# Patient Record
Sex: Female | Born: 1935 | Race: White | Hispanic: No | Smoking: Never smoker
Health system: Southern US, Community
[De-identification: ages and names within clinical notes are randomized; demographics above are authoritative.]

## PROBLEM LIST (undated history)

## (undated) DIAGNOSIS — M1990 Unspecified osteoarthritis, unspecified site: Secondary | ICD-10-CM

## (undated) DIAGNOSIS — I219 Acute myocardial infarction, unspecified: Secondary | ICD-10-CM

## (undated) DIAGNOSIS — N289 Disorder of kidney and ureter, unspecified: Secondary | ICD-10-CM

## (undated) DIAGNOSIS — E785 Hyperlipidemia, unspecified: Secondary | ICD-10-CM

## (undated) DIAGNOSIS — H3530 Unspecified macular degeneration: Secondary | ICD-10-CM

## (undated) DIAGNOSIS — I495 Sick sinus syndrome: Secondary | ICD-10-CM

## (undated) DIAGNOSIS — J45909 Unspecified asthma, uncomplicated: Secondary | ICD-10-CM

## (undated) DIAGNOSIS — I10 Essential (primary) hypertension: Secondary | ICD-10-CM

## (undated) DIAGNOSIS — M7120 Synovial cyst of popliteal space [Baker], unspecified knee: Secondary | ICD-10-CM

## (undated) DIAGNOSIS — K219 Gastro-esophageal reflux disease without esophagitis: Secondary | ICD-10-CM

## (undated) DIAGNOSIS — I509 Heart failure, unspecified: Secondary | ICD-10-CM

## (undated) DIAGNOSIS — Z227 Latent tuberculosis: Secondary | ICD-10-CM

## (undated) DIAGNOSIS — I5032 Chronic diastolic (congestive) heart failure: Secondary | ICD-10-CM

## (undated) DIAGNOSIS — I4891 Unspecified atrial fibrillation: Secondary | ICD-10-CM

## (undated) DIAGNOSIS — Z950 Presence of cardiac pacemaker: Secondary | ICD-10-CM

## (undated) DIAGNOSIS — N393 Stress incontinence (female) (male): Secondary | ICD-10-CM

## (undated) DIAGNOSIS — Z8619 Personal history of other infectious and parasitic diseases: Secondary | ICD-10-CM

## (undated) DIAGNOSIS — E119 Type 2 diabetes mellitus without complications: Secondary | ICD-10-CM

## (undated) DIAGNOSIS — Z9989 Dependence on other enabling machines and devices: Secondary | ICD-10-CM

## (undated) DIAGNOSIS — J449 Chronic obstructive pulmonary disease, unspecified: Secondary | ICD-10-CM

## (undated) DIAGNOSIS — G4733 Obstructive sleep apnea (adult) (pediatric): Secondary | ICD-10-CM

## (undated) DIAGNOSIS — C801 Malignant (primary) neoplasm, unspecified: Secondary | ICD-10-CM

## (undated) DIAGNOSIS — R011 Cardiac murmur, unspecified: Secondary | ICD-10-CM

## (undated) HISTORY — PX: BLADDER SUSPENSION: SHX72

## (undated) HISTORY — PX: CHOLECYSTECTOMY: SHX55

## (undated) HISTORY — PX: PACEMAKER INSERTION: SHX728

## (undated) HISTORY — PX: TUBAL LIGATION: SHX77

---

## 2004-04-27 DIAGNOSIS — I219 Acute myocardial infarction, unspecified: Secondary | ICD-10-CM

## 2004-04-27 HISTORY — DX: Acute myocardial infarction, unspecified: I21.9

## 2005-04-27 DIAGNOSIS — Z950 Presence of cardiac pacemaker: Secondary | ICD-10-CM

## 2005-04-27 DIAGNOSIS — I495 Sick sinus syndrome: Secondary | ICD-10-CM

## 2005-04-27 HISTORY — DX: Presence of cardiac pacemaker: Z95.0

## 2005-04-27 HISTORY — DX: Sick sinus syndrome: I49.5

## 2012-10-13 DIAGNOSIS — Z9989 Dependence on other enabling machines and devices: Secondary | ICD-10-CM

## 2012-10-13 DIAGNOSIS — I4891 Unspecified atrial fibrillation: Secondary | ICD-10-CM | POA: Insufficient documentation

## 2012-10-13 DIAGNOSIS — I10 Essential (primary) hypertension: Secondary | ICD-10-CM | POA: Insufficient documentation

## 2012-10-13 DIAGNOSIS — I2510 Atherosclerotic heart disease of native coronary artery without angina pectoris: Secondary | ICD-10-CM | POA: Insufficient documentation

## 2012-10-13 DIAGNOSIS — I495 Sick sinus syndrome: Secondary | ICD-10-CM | POA: Insufficient documentation

## 2012-10-13 DIAGNOSIS — G4733 Obstructive sleep apnea (adult) (pediatric): Secondary | ICD-10-CM | POA: Insufficient documentation

## 2014-03-01 DIAGNOSIS — R0600 Dyspnea, unspecified: Secondary | ICD-10-CM | POA: Insufficient documentation

## 2014-03-01 DIAGNOSIS — M1990 Unspecified osteoarthritis, unspecified site: Secondary | ICD-10-CM | POA: Insufficient documentation

## 2014-03-01 DIAGNOSIS — E119 Type 2 diabetes mellitus without complications: Secondary | ICD-10-CM | POA: Insufficient documentation

## 2014-03-08 DIAGNOSIS — Z227 Latent tuberculosis: Secondary | ICD-10-CM | POA: Insufficient documentation

## 2014-06-27 ENCOUNTER — Emergency Department (HOSPITAL_COMMUNITY): Payer: Medicare Other

## 2014-06-27 ENCOUNTER — Emergency Department (HOSPITAL_COMMUNITY)
Admission: EM | Admit: 2014-06-27 | Discharge: 2014-06-27 | Disposition: A | Discharge status: Home / Self Care | Payer: Medicare Other | Attending: Emergency Medicine | Admitting: Emergency Medicine

## 2014-06-27 ENCOUNTER — Encounter (HOSPITAL_COMMUNITY): Payer: Self-pay | Admitting: *Deleted

## 2014-06-27 DIAGNOSIS — R0602 Shortness of breath: Secondary | ICD-10-CM | POA: Diagnosis present

## 2014-06-27 DIAGNOSIS — Z79899 Other long term (current) drug therapy: Secondary | ICD-10-CM | POA: Insufficient documentation

## 2014-06-27 DIAGNOSIS — Z7901 Long term (current) use of anticoagulants: Secondary | ICD-10-CM | POA: Diagnosis not present

## 2014-06-27 DIAGNOSIS — I4891 Unspecified atrial fibrillation: Secondary | ICD-10-CM | POA: Insufficient documentation

## 2014-06-27 DIAGNOSIS — R05 Cough: Secondary | ICD-10-CM | POA: Insufficient documentation

## 2014-06-27 DIAGNOSIS — E119 Type 2 diabetes mellitus without complications: Secondary | ICD-10-CM | POA: Insufficient documentation

## 2014-06-27 DIAGNOSIS — E785 Hyperlipidemia, unspecified: Secondary | ICD-10-CM | POA: Insufficient documentation

## 2014-06-27 DIAGNOSIS — I10 Essential (primary) hypertension: Secondary | ICD-10-CM | POA: Insufficient documentation

## 2014-06-27 DIAGNOSIS — R0600 Dyspnea, unspecified: Secondary | ICD-10-CM | POA: Insufficient documentation

## 2014-06-27 DIAGNOSIS — Z794 Long term (current) use of insulin: Secondary | ICD-10-CM | POA: Diagnosis not present

## 2014-06-27 DIAGNOSIS — Z950 Presence of cardiac pacemaker: Secondary | ICD-10-CM | POA: Diagnosis not present

## 2014-06-27 HISTORY — DX: Unspecified atrial fibrillation: I48.91

## 2014-06-27 HISTORY — DX: Hyperlipidemia, unspecified: E78.5

## 2014-06-27 LAB — CBC
HCT: 38.4 % (ref 36.0–46.0)
Hemoglobin: 12.6 g/dL (ref 12.0–15.0)
MCH: 27.8 pg (ref 26.0–34.0)
MCHC: 32.8 g/dL (ref 30.0–36.0)
MCV: 84.8 fL (ref 78.0–100.0)
Platelets: 210 10*3/uL (ref 150–400)
RBC: 4.53 MIL/uL (ref 3.87–5.11)
RDW: 14.3 % (ref 11.5–15.5)
WBC: 7.2 10*3/uL (ref 4.0–10.5)

## 2014-06-27 LAB — BASIC METABOLIC PANEL
Anion gap: 8 (ref 5–15)
BUN: 24 mg/dL — AB (ref 6–23)
CO2: 27 mmol/L (ref 19–32)
CREATININE: 1.28 mg/dL — AB (ref 0.50–1.10)
Calcium: 9.5 mg/dL (ref 8.4–10.5)
Chloride: 105 mmol/L (ref 96–112)
GFR calc Af Amer: 43 mL/min — ABNORMAL LOW (ref >90)
GFR calc non Af Amer: 37 mL/min — ABNORMAL LOW (ref >90)
GLUCOSE: 163 mg/dL — AB (ref 70–99)
Potassium: 4.3 mmol/L (ref 3.5–5.1)
Sodium: 140 mmol/L (ref 135–145)

## 2014-06-27 LAB — I-STAT TROPONIN, ED: TROPONIN I, POC: 0.01 ng/mL (ref 0.00–0.08)

## 2014-06-27 LAB — D-DIMER, QUANTITATIVE (NOT AT ARMC)

## 2014-06-27 LAB — PROTIME-INR
INR: 1.67 — ABNORMAL HIGH (ref 0.00–1.49)
Prothrombin Time: 19.8 seconds — ABNORMAL HIGH (ref 11.6–15.2)

## 2014-06-27 NOTE — Discharge Instructions (Signed)

## 2014-06-27 NOTE — ED Notes (Signed)
Patient had carioversion on Jan 23rd and also had pacemaker replaced.  Patient with onset of sob 2 days ago. Her MD sent here for further evaluation and concerned that patient may be in afib.  Patient is alert.  She is seen by Dr Willis Modena with [facility]

## 2014-06-27 NOTE — ED Provider Notes (Signed)
CSN: [id number]     Arrival date & time 06/27/14  1015 History   First MD Initiated Contact with Patient 06/27/14 1028     Chief Complaint  Patient presents with  . Atrial Fibrillation  . Shortness of Breath   HPI Pt had a cardioversion on Jan 23 because of a fib.  She also had her pacemaker battery replaced.  The last couple of days she noticed that she was getting short of breath.  She does have a cough but that is not necessarily new.  No leg swelling.  No fevers.  No chest pain.  The shortness of breath is moderate.  It gets better with rest.  Worse with exertion.  In the past she felt this way with her a fib.  She called her doctor today and was told to come to the ED. Past Medical History  Diagnosis Date  . Atrial fibrillation   . Bradycardia   . Diabetes mellitus without complication   . Hypertension   . Hyperlipemia    Past Surgical History  Procedure Laterality Date  . Pacemaker insertion    . Cholecystectomy    . Bladder suspension     No family history on file. History  Substance Use Topics  . Smoking status: Never Smoker   . Smokeless tobacco: Not on file  . Alcohol Use: No   OB History    No data available     Review of Systems  All other systems reviewed and are negative.     Allergies  Sulfa antibiotics and Vasotec  Home Medications   Prior to Admission medications   Medication Sig Start Date End Date Taking? Authorizing Provider  albuterol (PROVENTIL HFA;VENTOLIN HFA) 108 (90 BASE) MCG/ACT inhaler Inhale 1-2 puffs into the lungs every 6 (six) hours as needed for wheezing or shortness of breath.   Yes Historical Provider, MD  amLODipine (NORVASC) 10 MG tablet Take 10 mg by mouth daily.   Yes Historical Provider, MD  atorvastatin (LIPITOR) 40 MG tablet Take 40 mg by mouth daily.   Yes Historical Provider, MD  carvedilol (COREG) 12.5 MG tablet Take 12.5 mg by mouth 2 (two) times daily with a meal.   Yes Historical Provider, MD  cetirizine (ZYRTEC) 10  MG tablet Take 10 mg by mouth daily.   Yes Historical Provider, MD  cholecalciferol (VITAMIN D) 1000 UNITS tablet Take 1,000 Units by mouth 2 (two) times daily.   Yes Historical Provider, MD  insulin detemir (LEVEMIR) 100 UNIT/ML injection Inject 43 Units into the skin at bedtime.   Yes Historical Provider, MD  losartan (COZAAR) 100 MG tablet Take 100 mg by mouth daily.   Yes Historical Provider, MD  Multiple Vitamins-Minerals (PRESERVISION AREDS) CAPS Take 1 capsule by mouth 2 (two) times daily.   Yes Historical Provider, MD  nitroGLYCERIN (NITROSTAT) 0.4 MG SL tablet Place 0.4 mg under the tongue every 5 (five) minutes as needed for chest pain.   Yes Historical Provider, MD  Omega-3 Fatty Acids (FISH OIL) 1000 MG CAPS Take 1,000 mg by mouth 2 (two) times daily.   Yes Historical Provider, MD  omeprazole (PRILOSEC) 20 MG capsule Take 20 mg by mouth daily.   Yes Historical Provider, MD  traMADol (ULTRAM) 50 MG tablet Take 50 mg by mouth every 6 (six) hours as needed for moderate pain.   Yes Historical Provider, MD  warfarin (COUMADIN) 3 MG tablet Take 3 mg by mouth See admin instructions. 3mg  all days but Sunday  Yes Historical Provider, MD   BP 144/94 mmHg  Pulse 60  Temp(Src) 97.9 F (36.6 C) (Oral)  Resp 14  Ht 5\' 2"  (1.575 m)  Wt 191 lb (86.637 kg)  BMI 34.93 kg/m2  SpO2 98% Physical Exam  Constitutional: She appears well-developed and well-nourished. No distress.  HENT:  Head: Normocephalic and atraumatic.  Right Ear: External ear normal.  Left Ear: External ear normal.  Eyes: Conjunctivae are normal. Right eye exhibits no discharge. Left eye exhibits no discharge. No scleral icterus.  Neck: Neck supple. No tracheal deviation present.  Cardiovascular: Normal rate, regular rhythm and intact distal pulses.   Pulmonary/Chest: Effort normal and breath sounds normal. No stridor. No respiratory distress. She has no wheezes. She has no rales.  Abdominal: Soft. Bowel sounds are normal. She  exhibits no distension. There is no tenderness. There is no rebound and no guarding.  Musculoskeletal: She exhibits no edema or tenderness.  Neurological: She is alert. She has normal strength. No cranial nerve deficit (no facial droop, extraocular movements intact, no slurred speech) or sensory deficit. She exhibits normal muscle tone. She displays no seizure activity. Coordination normal.  Skin: Skin is warm and dry. No rash noted.  Psychiatric: She has a normal mood and affect.  Nursing note and vitals reviewed.   ED Course  Procedures (including critical care time) Labs Review Labs Reviewed  BASIC METABOLIC PANEL - Abnormal; Notable for the following:    Glucose, Bld 163 (*)    BUN 24 (*)    Creatinine, Ser 1.28 (*)    GFR calc non Af Amer 37 (*)    GFR calc Af Amer 43 (*)    All other components within normal limits  PROTIME-INR - Abnormal; Notable for the following:    Prothrombin Time 19.8 (*)    INR 1.67 (*)    All other components within normal limits  CBC  D-DIMER, QUANTITATIVE  I-STAT TROPOININ, ED    Imaging Review Dg Chest 2 View  06/27/2014   CLINICAL DATA:  Shortness of breath, atrial fibrillation  EXAM: CHEST  2 VIEW  COMPARISON:  None.  FINDINGS: Cardiomediastinal silhouette is unremarkable. No acute infiltrate or pleural effusion. No pulmonary edema. Dual lead cardiac pacemaker with leads in right atrium and right ventricle. Atherosclerotic calcifications of thoracic aorta. Bony thorax is unremarkable.  IMPRESSION: No active disease.  Dual lead cardiac pacemaker in place.   Electronically Signed   By: Lahoma Crocker M.D.   On: 06/27/2014 11:19     EKG Interpretation   Date/Time:  Wednesday June 27 2014 11:36:34 EST Ventricular Rate:  60 PR Interval:  224 QRS Duration: 100 QT Interval:  450 QTC Calculation: 450 R Axis:   4 Text Interpretation:  Atrial-paced rhythm Probable anteroseptal infarct,  old No significant change since last tracing Confirmed by Kerryann Allaire   MD-J, Aitan Rossbach  ([id number]) on 06/27/2014 11:40:07 AM      MDM   Final diagnoses:  Dyspnea    The patient has a normal evaluation here in the emergency department. She is not hypoxic she is not tachypnea. Her laboratory tests are reassuring. I doubt pulmonary embolism congestive heart failure, pneumonia or acute coronary syndrome.  The patient is not in atrial fibrillation. Her EKG shows that her pacemaker is working properly.  Etiology of her dyspnea is unclear  I think the patient is stable to follow up with her primary doctor and/or cardiologist.  Warning signs and precautions were discussed    Dorie Rank, MD  06/27/14 1255 

## 2014-07-06 ENCOUNTER — Ambulatory Visit: Payer: Self-pay | Admitting: Gynecology

## 2014-07-11 ENCOUNTER — Ambulatory Visit: Payer: Medicare Other

## 2014-07-11 ENCOUNTER — Ambulatory Visit: Payer: Medicare Other | Attending: Gynecology | Admitting: Gynecologic Oncology

## 2014-07-11 ENCOUNTER — Encounter: Payer: Self-pay | Admitting: *Deleted

## 2014-07-11 ENCOUNTER — Encounter: Payer: Self-pay | Admitting: Gynecologic Oncology

## 2014-07-11 VITALS — BP 170/59 | HR 60 | Resp 22 | Ht 62.0 in | Wt 194.7 lb

## 2014-07-11 DIAGNOSIS — B373 Candidiasis of vulva and vagina: Secondary | ICD-10-CM | POA: Insufficient documentation

## 2014-07-11 DIAGNOSIS — Z79899 Other long term (current) drug therapy: Secondary | ICD-10-CM | POA: Diagnosis not present

## 2014-07-11 DIAGNOSIS — C541 Malignant neoplasm of endometrium: Secondary | ICD-10-CM | POA: Insufficient documentation

## 2014-07-11 DIAGNOSIS — E785 Hyperlipidemia, unspecified: Secondary | ICD-10-CM | POA: Diagnosis not present

## 2014-07-11 DIAGNOSIS — I4891 Unspecified atrial fibrillation: Secondary | ICD-10-CM | POA: Insufficient documentation

## 2014-07-11 DIAGNOSIS — Z950 Presence of cardiac pacemaker: Secondary | ICD-10-CM | POA: Diagnosis not present

## 2014-07-11 DIAGNOSIS — N950 Postmenopausal bleeding: Secondary | ICD-10-CM | POA: Insufficient documentation

## 2014-07-11 DIAGNOSIS — B372 Candidiasis of skin and nail: Secondary | ICD-10-CM | POA: Insufficient documentation

## 2014-07-11 DIAGNOSIS — E119 Type 2 diabetes mellitus without complications: Secondary | ICD-10-CM | POA: Insufficient documentation

## 2014-07-11 DIAGNOSIS — Z9049 Acquired absence of other specified parts of digestive tract: Secondary | ICD-10-CM | POA: Diagnosis not present

## 2014-07-11 DIAGNOSIS — Z794 Long term (current) use of insulin: Secondary | ICD-10-CM | POA: Diagnosis not present

## 2014-07-11 DIAGNOSIS — I10 Essential (primary) hypertension: Secondary | ICD-10-CM | POA: Diagnosis not present

## 2014-07-11 DIAGNOSIS — Z7901 Long term (current) use of anticoagulants: Secondary | ICD-10-CM | POA: Diagnosis not present

## 2014-07-11 DIAGNOSIS — B3731 Acute candidiasis of vulva and vagina: Secondary | ICD-10-CM

## 2014-07-11 LAB — BASIC METABOLIC PANEL (CC13)
ANION GAP: 11 meq/L (ref 3–11)
BUN: 30.9 mg/dL — ABNORMAL HIGH (ref 7.0–26.0)
CALCIUM: 9.3 mg/dL (ref 8.4–10.4)
CO2: 26 mEq/L (ref 22–29)
Chloride: 107 mEq/L (ref 98–109)
Creatinine: 1.3 mg/dL — ABNORMAL HIGH (ref 0.6–1.1)
EGFR: 37 mL/min/{1.73_m2} — AB (ref >90)
Glucose: 92 mg/dl (ref 70–140)
POTASSIUM: 4.8 meq/L (ref 3.5–5.1)
Sodium: 144 mEq/L (ref 136–145)

## 2014-07-11 MED ORDER — NYSTATIN 100000 UNIT/GM EX POWD: 1.0000 | Freq: Two times a day (BID) | CUTANEOUS | Status: DC

## 2014-07-11 MED ORDER — FLUCONAZOLE 100 MG PO TABS: 150.0000 mg | ORAL_TABLET | Freq: Every day | ORAL | Status: DC

## 2014-07-11 NOTE — Patient Instructions (Addendum)
Preparing for your Surgery  Plan for surgery on April 21 with Dr. Denman George.  Pre-operative Testing -You will receive a phone call from presurgical testing at [facility] to arrange for a pre-operative testing appointment before your surgery.  This appointment normally occurs one to two weeks before your scheduled surgery.   -Bring your insurance card, copy of an advanced directive if applicable, medication list  -At that visit, you will be asked to sign a consent for a possible blood transfusion in case a transfusion becomes necessary during surgery.  The need for a blood transfusion is rare but having consent is a necessary part of your care.     -Stop taking coumadin 7 days prior to surgery. - Call Dr. Archie Balboa office at [phone] to schedule an appointment for cardiac clearance prior to surgery.  Day Before Surgery at [place] will be asked to take in only clear liquids the day before surgery.  Examples of clear liquids include broths, jello, and clear juices. You will be advised to have nothing to eat or drink after midnight the evening before.    Your role in recovery Your role is to become active as soon as directed by your doctor, while still giving yourself time to heal.  Rest when you feel tired. You will be asked to do the following in order to speed your recovery:  - Cough and breathe deeply. This helps toclear and expand your lungs and can prevent pneumonia. You may be given a spirometer to practice deep breathing. A staff member will show you how to use the spirometer. - Do mild physical activity. Walking or moving your legs help your circulation and body functions return to normal. A staff member will help you when you try to walk and will provide you with simple exercises. Do not try to get up or walk alone the first time. - Actively manage your pain. Managing your pain lets you move in comfort. We will ask you to rate your pain on a scale of zero to  10. It is your responsibility to tell your doctor or nurse where and how much you hurt so your pain can be treated.  Special Considerations -If you are diabetic, you may be placed on insulin after surgery to have closer control over your blood sugars to promote healing and recovery.  This does not mean that you will be discharged on insulin.  If applicable, your oral antidiabetics will be resumed when you are tolerating a solid diet.  -Your final pathology results from surgery should be available by the Friday after surgery and the results will be relayed to you when available.  Blood Transfusion Information WHAT IS A BLOOD TRANSFUSION? A transfusion is the replacement of blood or some of its parts. Blood is made up of multiple cells which provide different functions.  Red blood cells carry oxygen and are used for blood loss replacement.  White blood cells fight against infection.  Platelets control bleeding.  Plasma helps clot blood.  Other blood products are available for specialized needs, such as hemophilia or other clotting disorders. BEFORE THE TRANSFUSION  Who gives blood for transfusions?   You may be able to donate blood to be used at a later date on yourself (autologous donation).  Relatives can be asked to donate blood. This is generally not any safer than if you have received blood from a stranger. The same precautions are taken to ensure safety when a relative's blood is donated.  Healthy  volunteers who are fully evaluated to make sure their blood is safe. This is blood bank blood. Transfusion therapy is the safest it has ever been in the practice of medicine. Before blood is taken from a donor, a complete history is taken to make sure that person has no history of diseases nor engages in risky social behavior (examples are intravenous drug use or sexual activity with multiple partners). The donor's travel history is screened to minimize risk of transmitting infections, such  as malaria. The donated blood is tested for signs of infectious diseases, such as HIV and hepatitis. The blood is then tested to be sure it is compatible with you in order to minimize the chance of a transfusion reaction. If you or a relative donates blood, this is often done in anticipation of surgery and is not appropriate for emergency situations. It takes many days to process the donated blood. RISKS AND COMPLICATIONS Although transfusion therapy is very safe and saves many lives, the main dangers of transfusion include:   Getting an infectious disease.  Developing a transfusion reaction. This is an allergic reaction to something in the blood you were given. Every precaution is taken to prevent this. The decision to have a blood transfusion has been considered carefully by your caregiver before blood is given. Blood is not given unless the benefits outweigh the risks.

## 2014-07-11 NOTE — Progress Notes (Signed)
Consult Note: Gyn-Onc  Consult was requested by Dr. Radene Knee for the evaluation of Summer Wong 81 y.o. female with endometrial cancer  CC:  Chief Complaint  Patient presents with  . endometrial cancer    Assessment/Plan:  Summer Wong  is a 81 y.o.  year old with clinical stage II high grade (grade 3) endometrial cancer.   A detailed discussion was held with the patient and her family with regard to to her endometrial cancer diagnosis. We discussed the standard management options for uterine cancer which includes surgery followed possibly by adjuvant therapy depending on the results of surgery. The options for surgical management include a hysterectomy and removal of the tubes and ovaries possibly with removal of pelvic and para-aortic lymph nodes. Despite visible endocervical involvement of the tumor, the cervical stroma and parametria are palpably clear, and I do not believe a radical hysterectomy is needed to achieve negative margins. A minimally invasive approach including a robotic hysterectomy or laparoscopic hysterectomy have benefits including shorter hospital stay, recovery time and better wound healing. The alternative approach is an open hysterectomy. The patient has been counseled about these surgical options and the risks of surgery in general including infection, bleeding, damage to surrounding structures (including bowel, bladder, ureters, nerves or vessels), and the postoperative risks of PE/ DVT, and lymphedema. I extensively reviewed the additional risks of robotic hysterectomy including possible need for conversion to open laparotomy.  I discussed positioning during surgery of trendelenberg and risks of minor facial swelling and care we take in preoperative positioning.  After counseling and consideration of her options, she desires to proceed with robotic assisted hysterectomy, BSO, lymphadenectomy.  I discussed that postoperatively she will certainly require at least  radiation for her stage II disease. I discussed that the addition of chemotherapy will be pending her staging results and cell type evaluation.    We will assess a preoperative CT of chest, abdomen and pelvis to rule out distant metastatic disease that would contraindicate upfront surgery or a minimally invasive approach.  She will be seen by anesthesia for preoperative clearance and discussion of postoperative pain management. She takes coumadin for a fib. We recommend stopping this preoperatively (7 days preop) and will check a PT/INR within 48 hours of surgery to confirm a normalized coag profile. She does not require bridging anticoagulation.  She was given the opportunity to ask questions, which were answered to her satisfaction, and she is agreement with the above mentioned plan of care.   HPI: Summer Wong is an 81 year old G2P2 who is seen in consultation at the request of Dr Radene Knee for grade 3, clinical stage II endometrial cancer. The patient began having postmenopausal bleeding within the past year. Dr Radene Knee performed a transvaginal US on 05/08/14 which showed an 8.4x3.8x2.3cm uterus with a thickened 1cm endometrial stripe. On 06/19/14 he performed endometrial and endocervical sampling (of a visible endocervical lesion). Both confirmed FIGO grade 3 endometrioid adenocarcinoma.   Interval History: She has had no further spotting since this biopsy.  Current Meds:  Outpatient Encounter Prescriptions as of 07/11/2014  Medication Sig  . Acetaminophen (TYLENOL EXTRA STRENGTH PO) Take by mouth as needed.  Marland Kitchen albuterol (PROVENTIL HFA;VENTOLIN HFA) 108 (90 BASE) MCG/ACT inhaler Inhale 1-2 puffs into the lungs every 6 (six) hours as needed for wheezing or shortness of breath.  Marland Kitchen amLODipine (NORVASC) 10 MG tablet Take 10 mg by mouth daily.  Marland Kitchen atorvastatin (LIPITOR) 40 MG tablet Take 40 mg by mouth daily.  Marland Kitchen  carvedilol (COREG) 12.5 MG tablet Take 12.5 mg by mouth 2 (two) times daily with a meal.  .  cetirizine (ZYRTEC) 10 MG tablet Take 10 mg by mouth daily.  . cholecalciferol (VITAMIN D) 1000 UNITS tablet Take 1,000 Units by mouth 2 (two) times daily.  . insulin detemir (LEVEMIR) 100 UNIT/ML injection Inject 43 Units into the skin at bedtime.  Marland Kitchen losartan (COZAAR) 100 MG tablet Take 100 mg by mouth daily.  . Multiple Vitamins-Minerals (PRESERVISION AREDS) CAPS Take 1 capsule by mouth 2 (two) times daily.  . Omega-3 Fatty Acids (FISH OIL) 1000 MG CAPS Take 1,000 mg by mouth 2 (two) times daily.  Marland Kitchen omeprazole (PRILOSEC) 20 MG capsule Take 20 mg by mouth daily.  Marland Kitchen warfarin (COUMADIN) 3 MG tablet Take 3 mg by mouth See admin instructions. 3mg  all days but Sunday  . fluconazole (DIFLUCAN) 100 MG tablet Take 1.5 tablets (150 mg total) by mouth daily.  . nitroGLYCERIN (NITROSTAT) 0.4 MG SL tablet Place 0.4 mg under the tongue every 5 (five) minutes as needed for chest pain.  Marland Kitchen nystatin (MYCOSTATIN/NYSTOP) 100000 UNIT/GM POWD Apply 1 Bottle topically 2 (two) times daily.  . [DISCONTINUED] traMADol (ULTRAM) 50 MG tablet Take 50 mg by mouth every 6 (six) hours as needed for moderate pain.    Allergy:  Allergies  Allergen Reactions  . Sulfa Antibiotics   . Vasotec [Enalaprilat]     Social Hx:   History   Social History  . Marital Status: Widowed    Spouse Name: N/A  . Number of Children: N/A  . Years of Education: N/A   Occupational History  . Not on file.   Social History Main Topics  . Smoking status: Never Smoker   . Smokeless tobacco: Not on file  . Alcohol Use: No  . Drug Use: No  . Sexual Activity: Not on file   Other Topics Concern  . Not on file   Social History Narrative    Past Surgical Hx:  Past Surgical History  Procedure Laterality Date  . Pacemaker insertion    . Cholecystectomy    . Bladder suspension      Past Medical Hx:  Past Medical History  Diagnosis Date  . Atrial fibrillation   . Bradycardia   . Diabetes mellitus without complication   .  Hypertension   . Hyperlipemia     Past Gynecological History:  G2P2, postmenopausal. Hx of bladder sling placed for incontinence. Persistent incontinence.  No LMP recorded.  Family Hx:  Family History  Problem Relation Age of Onset  . Stomach cancer Father   . Colon cancer Brother     Review of Systems:  Constitutional  Feels well,    ENT Normal appearing ears and nares bilaterally Skin/Breast  No rash, sores, jaundice, itching, dryness Cardiovascular  No chest pain, shortness of breath, or edema  Pulmonary  No cough or wheeze.  Gastro Intestinal  No nausea, vomitting, or diarrhoea. No bright red blood per rectum, no abdominal pain, change in bowel movement, or constipation.  Genito Urinary  No frequency, urgency, dysuria, + urinary incontinence. + post menopausal bleeding Musculo Skeletal  No myalgia, arthralgia, joint swelling or pain  Neurologic  No weakness, numbness, change in gait,  Psychology  No depression, anxiety, insomnia.   Vitals:  Blood pressure 170/59, pulse 60, temperature 0 F (-17.8 C), resp. rate 22, height 5\' 2"  (1.575 m), weight 194 lb 11.2 oz (88.315 kg).  Physical Exam: WD in NAD Neck  Supple  NROM, without any enlargements.  Lymph Node Survey No cervical supraclavicular or inguinal adenopathy Cardiovascular  Pulse normal rate, regularity and rhythm. S1 and S2 normal.  Lungs  Clear to auscultation bilateraly, without wheezes/crackles/rhonchi. Good air movement.  Skin  Candidiasis (red rash with satellite lesions measuring 30cm in left pannus/thigh fold). Psychiatry  Alert and oriented to person, place, and time  Abdomen  Normoactive bowel sounds, abdomen soft, non-tender and obese without evidence of hernia.  Back No CVA tenderness Genito Urinary  Vulva/vagina: Normal external female genitalia.  No lesions. No discharge or bleeding. Vulvovaginal candidiasis visible.  Bladder/urethra:  No lesions or masses, well supported  bladder  Vagina: normal, redundant, prolapse.  Cervix: Smooth ectocervix. Endocervix with papillary tissue within the canal. Smooth and soft cervix on palpation (not palpably infiltrated with tumor). No parametrial tumor palpable.  Uterus: Small, mobile, no parametrial involvement or nodularity.  Adnexa: no palpable masses. Rectal  Good tone, no masses no cul de sac nodularity.  Extremities  No bilateral cyanosis, clubbing or edema.   Donaciano Eva, MD   07/11/2014, 12:27 PM

## 2014-07-12 ENCOUNTER — Telehealth: Payer: Self-pay | Admitting: *Deleted

## 2014-07-12 NOTE — Telephone Encounter (Signed)
Received documentation from patient's cardiologist, Dr. Gennette Pac, stating that patient is cleared for surgery from a cardiac standpoint. Documentation sent to HIM to be scanned into patient's chart and copy given to Joylene John, NP.  Called patient and let her know that we have received cardiac clearance. Patient states she is going next week to her PCP who manages her coumadin. Told patient that if PCP does not agree with stopping coumadin 7 days prior to surgery as planned by Dr. Denman George to please call our office or fax over documentation. Patient agreeable to discuss this with PCP next week.

## 2014-07-17 ENCOUNTER — Telehealth: Payer: Self-pay | Admitting: *Deleted

## 2014-07-17 NOTE — Telephone Encounter (Signed)
Per Dr. Denman George, patient needs PT/INR drawn 48 hours pre-op. Called and spoke with Peter Congo at Dr. Macario Golds office and made appt for patient to have labs drawn on 10:45am on 08-14-14.

## 2014-07-19 ENCOUNTER — Ambulatory Visit (HOSPITAL_COMMUNITY): Payer: Medicare Other

## 2014-07-23 ENCOUNTER — Encounter (HOSPITAL_COMMUNITY): Payer: Self-pay

## 2014-07-23 ENCOUNTER — Ambulatory Visit (HOSPITAL_COMMUNITY)
Admission: RE | Admit: 2014-07-23 | Discharge: 2014-07-23 | Disposition: A | Discharge status: Home / Self Care | Payer: Medicare Other | Source: Ambulatory Visit | Attending: Gynecologic Oncology | Admitting: Gynecologic Oncology

## 2014-07-23 DIAGNOSIS — C541 Malignant neoplasm of endometrium: Secondary | ICD-10-CM | POA: Diagnosis not present

## 2014-07-23 MED ORDER — IOHEXOL 300 MG/ML  SOLN
80.0000 mL | Freq: Once | INTRAMUSCULAR | Status: AC | PRN
  Administered 2014-07-23: 80 mL via INTRAVENOUS

## 2014-07-30 NOTE — Telephone Encounter (Signed)
Spoke with Izora Gala in the lab at Dr. Macario Golds office - she confirms that they received fax from our office requesting that PT/INR be faxed to our office at [phone] after they are drawn on 08-14-14.

## 2014-07-31 ENCOUNTER — Telehealth: Payer: Self-pay | Admitting: *Deleted

## 2014-07-31 NOTE — Telephone Encounter (Signed)
Per Dr. Denman George, patient notified that CT scan did not show any apparent metastatic endometrial cancer. Patient appreciative of call.

## 2014-08-08 NOTE — Patient Instructions (Signed)
Summer Wong  08/08/2014   Your procedure is scheduled on:    08/16/2014    Report to [facility] Main  Entrance and follow signs to               [place] at    [place] [facility].  Call this number if you have problems the morning of surgery [phone]   Remember:    Clear liquid diet beginning on 08/15/2014 am.    Do not eat food or drink liquids :After Midnight.  Take 1/2 of evening dose of Insulin nite before surgery.     Take these medicines the morning of surgery with A SIP OF WATER:    Albuterol Inhaler if needed and bring, Amlodipine ( Norvasc), Carvedilol ( coreg), Zyrtec, Prilosec                                You may not have any metal on your body including hair pins and              piercings  Do not wear jewelry, make-up, lotions, powders or perfumes., deodorant.               Do not wear nail polish.  Do not shave  48 hours prior to surgery.     Do not bring valuables to the hospital. [place].  Contacts, dentures or bridgework may not be worn into surgery.  Leave suitcase in the car. After surgery it may be brought to your room.         Special Instructions: coughing and deep breathing exercises, leg exercises    CLEAR LIQUID DIET   Foods Allowed                                                                     Foods Excluded  Coffee and tea, regular and decaf                             liquids that you cannot  Plain Jell-O in any flavor                                             see through such as: Fruit ices (not with fruit pulp)                                     milk, soups, orange juice  Iced Popsicles                                    All solid food Carbonated beverages, regular and diet  Cranberry, grape and apple juices Sports drinks like Gatorade Lightly seasoned clear broth or consume(fat free) Sugar, honey syrup  Sample  Menu Breakfast                                Lunch                                     Supper Cranberry juice                    Beef broth                            Chicken broth Jell-O                                     Grape juice                           Apple juice Coffee or tea                        Jell-O                                      Popsicle                                                Coffee or tea                        Coffee or tea  _____________________________________________________________________                Please read over the following fact sheets you were given: _____________________________________________________________________             [facility] - Preparing for Surgery Before surgery, you can play an important role.  Because skin is not sterile, your skin needs to be as free of germs as possible.  You can reduce the number of germs on your skin by washing with CHG (chlorahexidine gluconate) soap before surgery.  CHG is an antiseptic cleaner which kills germs and bonds with the skin to continue killing germs even after washing. Please DO NOT use if you have an allergy to CHG or antibacterial soaps.  If your skin becomes reddened/irritated stop using the CHG and inform your nurse when you arrive at Short Stay. Do not shave (including legs and underarms) for at least 48 hours prior to the first CHG shower.  You may shave your face/neck. Please follow these instructions carefully:  1.  Shower with CHG Soap the night before surgery and the  morning of Surgery.  2.  If you choose to wash your hair, wash your hair first as usual with your  normal  shampoo.  3.  After you shampoo, rinse your hair and body thoroughly to remove the  shampoo.  4.  Use CHG as you would any other liquid soap.  You can apply chg directly  to the skin and wash                       Gently with a scrungie or clean washcloth.  5.  Apply the CHG Soap to  your body ONLY FROM THE NECK DOWN.   Do not use on face/ open                           Wound or open sores. Avoid contact with eyes, ears mouth and genitals (private parts).                       Wash face,  Genitals (private parts) with your normal soap.             6.  Wash thoroughly, paying special attention to the area where your surgery  will be performed.  7.  Thoroughly rinse your body with warm water from the neck down.  8.  DO NOT shower/wash with your normal soap after using and rinsing off  the CHG Soap.                9.  Pat yourself dry with a clean towel.            10.  Wear clean pajamas.            11.  Place clean sheets on your bed the night of your first shower and do not  sleep with pets. Day of Surgery : Do not apply any lotions/deodorants the morning of surgery.  Please wear clean clothes to the hospital/surgery center.  FAILURE TO FOLLOW THESE INSTRUCTIONS MAY RESULT IN THE CANCELLATION OF YOUR SURGERY PATIENT SIGNATURE_________________________________  NURSE SIGNATURE__________________________________  ________________________________________________________________________  WHAT IS A BLOOD TRANSFUSION? Blood Transfusion Information  A transfusion is the replacement of blood or some of its parts. Blood is made up of multiple cells which provide different functions.  Red blood cells carry oxygen and are used for blood loss replacement.  White blood cells fight against infection.  Platelets control bleeding.  Plasma helps clot blood.  Other blood products are available for specialized needs, such as hemophilia or other clotting disorders. BEFORE THE TRANSFUSION  Who gives blood for transfusions?   Healthy volunteers who are fully evaluated to make sure their blood is safe. This is blood bank blood. Transfusion therapy is the safest it has ever been in the practice of medicine. Before blood is taken from a donor, a complete history is taken to make sure  that person has no history of diseases nor engages in risky social behavior (examples are intravenous drug use or sexual activity with multiple partners). The donor's travel history is screened to minimize risk of transmitting infections, such as malaria. The donated blood is tested for signs of infectious diseases, such as HIV and hepatitis. The blood is then tested to be sure it is compatible with you in order to minimize the chance of a transfusion reaction. If you or a relative donates blood, this is often done in anticipation of surgery and is not appropriate for emergency situations. It takes many days to process the donated blood. RISKS AND COMPLICATIONS Although transfusion therapy is very safe and saves many lives, the main dangers of transfusion include:  1. Getting an infectious disease. 2. Developing a transfusion reaction.  This is an allergic reaction to something in the blood you were given. Every precaution is taken to prevent this. The decision to have a blood transfusion has been considered carefully by your caregiver before blood is given. Blood is not given unless the benefits outweigh the risks. AFTER THE TRANSFUSION  Right after receiving a blood transfusion, you will usually feel much better and more energetic. This is especially true if your red blood cells have gotten low (anemic). The transfusion raises the level of the red blood cells which carry oxygen, and this usually causes an energy increase.  The nurse administering the transfusion will monitor you carefully for complications. HOME CARE INSTRUCTIONS  No special instructions are needed after a transfusion. You may find your energy is better. Speak with your caregiver about any limitations on activity for underlying diseases you may have. SEEK MEDICAL CARE IF:   Your condition is not improving after your transfusion.  You develop redness or irritation at the intravenous (IV) site. SEEK IMMEDIATE MEDICAL CARE IF:  Any  of the following symptoms occur over the next 12 hours:  Shaking chills.  You have a temperature by mouth above 102 F (38.9 C), not controlled by medicine.  Chest, back, or muscle pain.  People around you feel you are not acting correctly or are confused.  Shortness of breath or difficulty breathing.  Dizziness and fainting.  You get a rash or develop hives.  You have a decrease in urine output.  Your urine turns a dark color or changes to pink, red, or brown. Any of the following symptoms occur over the next 10 days:  You have a temperature by mouth above 102 F (38.9 C), not controlled by medicine.  Shortness of breath.  Weakness after normal activity.  The white part of the eye turns yellow (jaundice).  You have a decrease in the amount of urine or are urinating less often.  Your urine turns a dark color or changes to pink, red, or brown. Document Released: 04/10/2000 Document Revised: 07/06/2011 Document Reviewed: 11/28/2007 ExitCare Patient Information 2014 [place].  _______________________________________________________________________  Incentive Spirometer  An incentive spirometer is a tool that can help keep your lungs clear and active. This tool measures how well you are filling your lungs with each breath. Taking long deep breaths may help reverse or decrease the chance of developing breathing (pulmonary) problems (especially infection) following:  A long period of time when you are unable to move or be active. BEFORE THE PROCEDURE   If the spirometer includes an indicator to show your best effort, your nurse or respiratory therapist will set it to a desired goal.  If possible, sit up straight or lean slightly forward. Try not to slouch.  Hold the incentive spirometer in an upright position. INSTRUCTIONS FOR USE  3. Sit on the edge of your bed if possible, or sit up as far as you can in bed or on a chair. 4. Hold the incentive spirometer in an  upright position. 5. Breathe out normally. 6. Place the mouthpiece in your mouth and seal your lips tightly around it. 7. Breathe in slowly and as deeply as possible, raising the piston or the ball toward the top of the column. 8. Hold your breath for 3-5 seconds or for as long as possible. Allow the piston or ball to fall to the bottom of the column. 9. Remove the mouthpiece from your mouth and breathe out normally. 10. Rest for a few seconds and repeat Steps  1 through 7 at least 10 times every 1-2 hours when you are awake. Take your time and take a few normal breaths between deep breaths. 11. The spirometer may include an indicator to show your best effort. Use the indicator as a goal to work toward during each repetition. 12. After each set of 10 deep breaths, practice coughing to be sure your lungs are clear. If you have an incision (the cut made at the time of surgery), support your incision when coughing by placing a pillow or rolled up towels firmly against it. Once you are able to get out of bed, walk around indoors and cough well. You may stop using the incentive spirometer when instructed by your caregiver.  RISKS AND COMPLICATIONS  Take your time so you do not get dizzy or light-headed.  If you are in pain, you may need to take or ask for pain medication before doing incentive spirometry. It is harder to take a deep breath if you are having pain. AFTER USE  Rest and breathe slowly and easily.  It can be helpful to keep track of a log of your progress. Your caregiver can provide you with a simple table to help with this. If you are using the spirometer at home, follow these instructions: [place] IF:   You are having difficultly using the spirometer.  You have trouble using the spirometer as often as instructed.  Your pain medication is not giving enough relief while using the spirometer.  You develop fever of 100.5 F (38.1 C) or higher. SEEK IMMEDIATE MEDICAL CARE  IF:   You cough up bloody sputum that had not been present before.  You develop fever of 102 F (38.9 C) or greater.  You develop worsening pain at or near the incision site. MAKE SURE YOU:   Understand these instructions.  Will watch your condition.  Will get help right away if you are not doing well or get worse. Document Released: 08/24/2006 Document Revised: 07/06/2011 Document Reviewed: 10/25/2006 [facility] Patient Information 2014 [place], [place].   ________________________________________________________________________

## 2014-08-09 ENCOUNTER — Encounter (HOSPITAL_COMMUNITY): Payer: Self-pay

## 2014-08-09 ENCOUNTER — Encounter (HOSPITAL_COMMUNITY)
Admission: RE | Admit: 2014-08-09 | Discharge: 2014-08-09 | Disposition: A | Discharge status: Home / Self Care | Payer: Medicare Other | Source: Ambulatory Visit | Attending: Gynecologic Oncology | Admitting: Gynecologic Oncology

## 2014-08-09 DIAGNOSIS — Z01818 Encounter for other preprocedural examination: Secondary | ICD-10-CM | POA: Diagnosis present

## 2014-08-09 DIAGNOSIS — C541 Malignant neoplasm of endometrium: Secondary | ICD-10-CM | POA: Insufficient documentation

## 2014-08-09 HISTORY — DX: Gastro-esophageal reflux disease without esophagitis: K21.9

## 2014-08-09 HISTORY — DX: Unspecified osteoarthritis, unspecified site: M19.90

## 2014-08-09 HISTORY — DX: Stress incontinence (female) (male): N39.3

## 2014-08-09 HISTORY — DX: Cardiac murmur, unspecified: R01.1

## 2014-08-09 LAB — CBC WITH DIFFERENTIAL/PLATELET
Basophils Absolute: 0 10*3/uL (ref 0.0–0.1)
Basophils Relative: 0 % (ref 0–1)
EOS ABS: 0.3 10*3/uL (ref 0.0–0.7)
EOS PCT: 3 % (ref 0–5)
HCT: 40.4 % (ref 36.0–46.0)
Hemoglobin: 12.8 g/dL (ref 12.0–15.0)
Lymphocytes Relative: 43 % (ref 12–46)
Lymphs Abs: 3.4 10*3/uL (ref 0.7–4.0)
MCH: 27.6 pg (ref 26.0–34.0)
MCHC: 31.7 g/dL (ref 30.0–36.0)
MCV: 87.3 fL (ref 78.0–100.0)
MONO ABS: 0.4 10*3/uL (ref 0.1–1.0)
Monocytes Relative: 5 % (ref 3–12)
NEUTROS ABS: 3.8 10*3/uL (ref 1.7–7.7)
Neutrophils Relative %: 49 % (ref 43–77)
Platelets: 255 10*3/uL (ref 150–400)
RBC: 4.63 MIL/uL (ref 3.87–5.11)
RDW: 14.1 % (ref 11.5–15.5)
WBC: 7.9 10*3/uL (ref 4.0–10.5)

## 2014-08-09 LAB — URINALYSIS, ROUTINE W REFLEX MICROSCOPIC
Bilirubin Urine: NEGATIVE
Glucose, UA: NEGATIVE mg/dL
HGB URINE DIPSTICK: NEGATIVE
Ketones, ur: NEGATIVE mg/dL
NITRITE: NEGATIVE
Protein, ur: 30 mg/dL — AB
SPECIFIC GRAVITY, URINE: 1.007 (ref 1.005–1.030)
UROBILINOGEN UA: 0.2 mg/dL (ref 0.0–1.0)
pH: 6.5 (ref 5.0–8.0)

## 2014-08-09 LAB — COMPREHENSIVE METABOLIC PANEL
ALT: 29 U/L (ref 0–35)
AST: 26 U/L (ref 0–37)
Albumin: 4.3 g/dL (ref 3.5–5.2)
Alkaline Phosphatase: 60 U/L (ref 39–117)
Anion gap: 8 (ref 5–15)
BUN: 34 mg/dL — ABNORMAL HIGH (ref 6–23)
CALCIUM: 9.4 mg/dL (ref 8.4–10.5)
CO2: 27 mmol/L (ref 19–32)
CREATININE: 1.47 mg/dL — AB (ref 0.50–1.10)
Chloride: 105 mmol/L (ref 96–112)
GFR calc Af Amer: 36 mL/min — ABNORMAL LOW (ref >90)
GFR calc non Af Amer: 31 mL/min — ABNORMAL LOW (ref >90)
Glucose, Bld: 105 mg/dL — ABNORMAL HIGH (ref 70–99)
Potassium: 4.8 mmol/L (ref 3.5–5.1)
Sodium: 140 mmol/L (ref 135–145)
Total Bilirubin: 0.7 mg/dL (ref 0.3–1.2)
Total Protein: 7.6 g/dL (ref 6.0–8.3)

## 2014-08-09 LAB — URINE MICROSCOPIC-ADD ON

## 2014-08-09 LAB — ABO/RH: ABO/RH(D): O POS

## 2014-08-09 LAB — PROTIME-INR
INR: 1.23 (ref 0.00–1.49)
PROTHROMBIN TIME: 15.6 s — AB (ref 11.6–15.2)

## 2014-08-09 LAB — APTT: APTT: 28 s (ref 24–37)

## 2014-08-09 NOTE — Progress Notes (Signed)
Received and placed on chart- Perioperative Device Orders.   Faxed and requested last device check from 07/09/2014 from Dr Willis Modena .

## 2014-08-09 NOTE — Progress Notes (Signed)
CMP, U/A and micro results done 08/09/2014 faxed via EPIC to Dr Denman George and Joylene John, NP

## 2014-08-09 NOTE — Progress Notes (Addendum)
EKG- 06/28/2014 EPIC  EKG- 07/10/2014 on chart  CT chest with contrast done 07/23/2014 EPIC  ECHO- 03/12/2014 on chart  Clearance- Dr Gennette Pac- 05/31/2014 on chart  Stress test done 03/06/14 in [place] in [place] notes from Dr Gennette Pac( cardiology) done 05/31/2014 and 07/12/2014 on chart

## 2014-08-09 NOTE — Progress Notes (Signed)
Dr Orene Desanctis ( anesthesia ) made aware of CT Chest results done 07/23/2014 and patient status today on preop appointment - sat 97%, hx of atrial fib, Diabetes Mellitus, pacer for bradycardia and hypertension.  Shortness of breath with exertion which is chronic per patient. Patient is still independent and able to drive.  No new orders given.

## 2014-08-13 NOTE — Progress Notes (Addendum)
Last device check 05/23/2014 on chart.

## 2014-08-14 ENCOUNTER — Telehealth: Payer: Self-pay | Admitting: Nurse Practitioner

## 2014-08-14 NOTE — Telephone Encounter (Signed)
Per Joylene John, NP, Rn calling to ensure patient stopped coumadin for upcoming surgery. Patient verifies she stopped coumadin last Thursday and "they checked it today and it was 1" assuming she is referring to her INR. Patient has no questions at this time and is encouraged to call if any arise before surgery. She voices understanding.

## 2014-08-16 ENCOUNTER — Ambulatory Visit (HOSPITAL_COMMUNITY): Payer: Medicare Other | Admitting: Anesthesiology

## 2014-08-16 ENCOUNTER — Encounter (HOSPITAL_COMMUNITY): Payer: Self-pay | Admitting: *Deleted

## 2014-08-16 ENCOUNTER — Encounter (HOSPITAL_COMMUNITY)
Admission: RE | Disposition: A | Discharge status: Home / Self Care | Payer: Self-pay | Source: Ambulatory Visit | Attending: Gynecologic Oncology

## 2014-08-16 ENCOUNTER — Observation Stay (HOSPITAL_COMMUNITY)
Admission: RE | Admit: 2014-08-16 | Discharge: 2014-08-17 | Disposition: A | Discharge status: Home / Self Care | Payer: Medicare Other | Source: Ambulatory Visit | Attending: Gynecologic Oncology | Admitting: Gynecologic Oncology

## 2014-08-16 DIAGNOSIS — C541 Malignant neoplasm of endometrium: Secondary | ICD-10-CM | POA: Diagnosis present

## 2014-08-16 DIAGNOSIS — I10 Essential (primary) hypertension: Secondary | ICD-10-CM | POA: Insufficient documentation

## 2014-08-16 DIAGNOSIS — R001 Bradycardia, unspecified: Secondary | ICD-10-CM | POA: Insufficient documentation

## 2014-08-16 DIAGNOSIS — Z882 Allergy status to sulfonamides status: Secondary | ICD-10-CM | POA: Insufficient documentation

## 2014-08-16 DIAGNOSIS — E785 Hyperlipidemia, unspecified: Secondary | ICD-10-CM | POA: Diagnosis not present

## 2014-08-16 DIAGNOSIS — Z79899 Other long term (current) drug therapy: Secondary | ICD-10-CM | POA: Insufficient documentation

## 2014-08-16 DIAGNOSIS — I4891 Unspecified atrial fibrillation: Secondary | ICD-10-CM | POA: Diagnosis not present

## 2014-08-16 DIAGNOSIS — E119 Type 2 diabetes mellitus without complications: Secondary | ICD-10-CM | POA: Insufficient documentation

## 2014-08-16 DIAGNOSIS — Z794 Long term (current) use of insulin: Secondary | ICD-10-CM | POA: Insufficient documentation

## 2014-08-16 DIAGNOSIS — Z888 Allergy status to other drugs, medicaments and biological substances status: Secondary | ICD-10-CM | POA: Diagnosis not present

## 2014-08-16 DIAGNOSIS — Z7901 Long term (current) use of anticoagulants: Secondary | ICD-10-CM | POA: Diagnosis not present

## 2014-08-16 HISTORY — PX: ROBOTIC ASSISTED TOTAL HYSTERECTOMY WITH BILATERAL SALPINGO OOPHERECTOMY: SHX6086

## 2014-08-16 LAB — TYPE AND SCREEN
ABO/RH(D): O POS
Antibody Screen: NEGATIVE

## 2014-08-16 LAB — GLUCOSE, CAPILLARY
GLUCOSE-CAPILLARY: 85 mg/dL (ref 70–99)
GLUCOSE-CAPILLARY: 131 mg/dL — AB (ref 70–99)
Glucose-Capillary: 119 mg/dL — ABNORMAL HIGH (ref 70–99)

## 2014-08-16 SURGERY — ROBOTIC ASSISTED TOTAL HYSTERECTOMY WITH BILATERAL SALPINGO OOPHORECTOMY
Anesthesia: General | Laterality: Bilateral

## 2014-08-16 MED ORDER — ENOXAPARIN SODIUM 40 MG/0.4ML ~~LOC~~ SOLN
40.0000 mg | SUBCUTANEOUS | Status: DC
  Administered 2014-08-17: 40 mg via SUBCUTANEOUS
  Filled 2014-08-16 (×2): qty 0.4

## 2014-08-16 MED ORDER — INSULIN ASPART 100 UNIT/ML ~~LOC~~ SOLN: 0.0000 [IU] | Freq: Three times a day (TID) | SUBCUTANEOUS | Status: DC

## 2014-08-16 MED ORDER — FENTANYL CITRATE (PF) 100 MCG/2ML IJ SOLN
INTRAMUSCULAR | Status: DC | PRN
Start: 2014-08-16 — End: 2014-08-16
  Administered 2014-08-16: 50 ug via INTRAVENOUS
  Administered 2014-08-16: 100 ug via INTRAVENOUS

## 2014-08-16 MED ORDER — FENTANYL CITRATE (PF) 100 MCG/2ML IJ SOLN
INTRAMUSCULAR | Status: AC
  Filled 2014-08-16: qty 2

## 2014-08-16 MED ORDER — ONDANSETRON HCL 4 MG/2ML IJ SOLN
INTRAMUSCULAR | Status: DC | PRN
Start: 2014-08-16 — End: 2014-08-16
  Administered 2014-08-16: 4 mg via INTRAVENOUS

## 2014-08-16 MED ORDER — ETOMIDATE 2 MG/ML IV SOLN
INTRAVENOUS | Status: AC
  Filled 2014-08-16: qty 10

## 2014-08-16 MED ORDER — NEOSTIGMINE METHYLSULFATE 10 MG/10ML IV SOLN
INTRAVENOUS | Status: DC | PRN
  Administered 2014-08-16: 4 mg via INTRAVENOUS

## 2014-08-16 MED ORDER — PROMETHAZINE HCL 25 MG/ML IJ SOLN
6.2500 mg | INTRAMUSCULAR | Status: DC | PRN
  Administered 2014-08-16: 6.25 mg via INTRAVENOUS

## 2014-08-16 MED ORDER — CISATRACURIUM BESYLATE 20 MG/10ML IV SOLN
INTRAVENOUS | Status: AC
  Filled 2014-08-16: qty 10

## 2014-08-16 MED ORDER — PROMETHAZINE HCL 25 MG/ML IJ SOLN
INTRAMUSCULAR | Status: AC
  Filled 2014-08-16: qty 1

## 2014-08-16 MED ORDER — CEFAZOLIN SODIUM-DEXTROSE 2-3 GM-% IV SOLR
INTRAVENOUS | Status: AC
  Filled 2014-08-16: qty 50

## 2014-08-16 MED ORDER — ONDANSETRON HCL 4 MG PO TABS: 4.0000 mg | ORAL_TABLET | Freq: Four times a day (QID) | ORAL | Status: DC | PRN

## 2014-08-16 MED ORDER — FENTANYL CITRATE (PF) 100 MCG/2ML IJ SOLN
25.0000 ug | INTRAMUSCULAR | Status: DC | PRN
  Administered 2014-08-16 (×2): 25 ug via INTRAVENOUS

## 2014-08-16 MED ORDER — PANTOPRAZOLE SODIUM 40 MG PO TBEC
40.0000 mg | DELAYED_RELEASE_TABLET | Freq: Every day | ORAL | Status: DC
  Administered 2014-08-17: 40 mg via ORAL
  Filled 2014-08-16: qty 1

## 2014-08-16 MED ORDER — OXYCODONE-ACETAMINOPHEN 5-325 MG PO TABS: 1.0000 | ORAL_TABLET | ORAL | Status: DC | PRN

## 2014-08-16 MED ORDER — PROPOFOL 10 MG/ML IV BOLUS
INTRAVENOUS | Status: DC | PRN
  Administered 2014-08-16: 100 mg via INTRAVENOUS

## 2014-08-16 MED ORDER — CARVEDILOL 12.5 MG PO TABS
12.5000 mg | ORAL_TABLET | Freq: Two times a day (BID) | ORAL | Status: DC
  Administered 2014-08-16 – 2014-08-17 (×2): 12.5 mg via ORAL
  Filled 2014-08-16 (×4): qty 1

## 2014-08-16 MED ORDER — PROPOFOL 10 MG/ML IV BOLUS
INTRAVENOUS | Status: AC
  Filled 2014-08-16: qty 20

## 2014-08-16 MED ORDER — KCL IN DEXTROSE-NACL 20-5-0.45 MEQ/L-%-% IV SOLN
INTRAVENOUS | Status: DC
  Administered 2014-08-16: 17:00:00 via INTRAVENOUS
  Filled 2014-08-16 (×2): qty 1000

## 2014-08-16 MED ORDER — ETOMIDATE 2 MG/ML IV SOLN
INTRAVENOUS | Status: DC | PRN
Start: 2014-08-16 — End: 2014-08-16
  Administered 2014-08-16: 10 mg via INTRAVENOUS

## 2014-08-16 MED ORDER — LACTATED RINGERS IV SOLN
INTRAVENOUS | Status: DC
  Administered 2014-08-16 (×2): via INTRAVENOUS
  Administered 2014-08-16: 1000 mL via INTRAVENOUS
  Administered 2014-08-16: 12:00:00 via INTRAVENOUS

## 2014-08-16 MED ORDER — ENOXAPARIN SODIUM 40 MG/0.4ML ~~LOC~~ SOLN
40.0000 mg | SUBCUTANEOUS | Status: AC
  Administered 2014-08-16: 40 mg via SUBCUTANEOUS
  Filled 2014-08-16: qty 0.4

## 2014-08-16 MED ORDER — GLYCOPYRROLATE 0.2 MG/ML IJ SOLN
INTRAMUSCULAR | Status: DC | PRN
  Administered 2014-08-16: 0.6 mg via INTRAVENOUS

## 2014-08-16 MED ORDER — INSULIN ASPART 100 UNIT/ML ~~LOC~~ SOLN: 3.0000 [IU] | Freq: Three times a day (TID) | SUBCUTANEOUS | Status: DC

## 2014-08-16 MED ORDER — ATORVASTATIN CALCIUM 40 MG PO TABS
40.0000 mg | ORAL_TABLET | Freq: Every morning | ORAL | Status: DC
  Administered 2014-08-17: 40 mg via ORAL
  Filled 2014-08-16: qty 1

## 2014-08-16 MED ORDER — CISATRACURIUM BESYLATE (PF) 10 MG/5ML IV SOLN
INTRAVENOUS | Status: DC | PRN
  Administered 2014-08-16 (×2): 2 mg via INTRAVENOUS
  Administered 2014-08-16: 10 mg via INTRAVENOUS
  Administered 2014-08-16: 3 mg via INTRAVENOUS

## 2014-08-16 MED ORDER — INSULIN GLARGINE 100 UNIT/ML ~~LOC~~ SOLN
30.0000 [IU] | Freq: Every day | SUBCUTANEOUS | Status: DC
  Administered 2014-08-16: 30 [IU] via SUBCUTANEOUS
  Filled 2014-08-16: qty 0.3

## 2014-08-16 MED ORDER — FENTANYL CITRATE (PF) 250 MCG/5ML IJ SOLN
INTRAMUSCULAR | Status: AC
  Filled 2014-08-16: qty 5

## 2014-08-16 MED ORDER — ONDANSETRON HCL 4 MG/2ML IJ SOLN: 4.0000 mg | Freq: Four times a day (QID) | INTRAMUSCULAR | Status: DC | PRN

## 2014-08-16 MED ORDER — ALBUTEROL SULFATE (2.5 MG/3ML) 0.083% IN NEBU: 2.5000 mg | INHALATION_SOLUTION | Freq: Four times a day (QID) | RESPIRATORY_TRACT | Status: DC | PRN

## 2014-08-16 MED ORDER — ONDANSETRON HCL 4 MG/2ML IJ SOLN
INTRAMUSCULAR | Status: AC
  Filled 2014-08-16: qty 2

## 2014-08-16 MED ORDER — AMLODIPINE BESYLATE 10 MG PO TABS
10.0000 mg | ORAL_TABLET | Freq: Every morning | ORAL | Status: DC
  Administered 2014-08-17: 10 mg via ORAL
  Filled 2014-08-16: qty 1

## 2014-08-16 MED ORDER — TRAMADOL HCL 50 MG PO TABS: 50.0000 mg | ORAL_TABLET | Freq: Four times a day (QID) | ORAL | Status: DC | PRN

## 2014-08-16 MED ORDER — CETYLPYRIDINIUM CHLORIDE 0.05 % MT LIQD
7.0000 mL | Freq: Two times a day (BID) | OROMUCOSAL | Status: DC
  Administered 2014-08-17: 7 mL via OROMUCOSAL

## 2014-08-16 MED ORDER — NITROGLYCERIN 0.4 MG SL SUBL: 0.4000 mg | SUBLINGUAL_TABLET | SUBLINGUAL | Status: DC | PRN

## 2014-08-16 MED ORDER — CEFAZOLIN SODIUM-DEXTROSE 2-3 GM-% IV SOLR
2.0000 g | INTRAVENOUS | Status: AC
  Administered 2014-08-16: 2 g via INTRAVENOUS

## 2014-08-16 MED ORDER — LOSARTAN POTASSIUM 50 MG PO TABS
100.0000 mg | ORAL_TABLET | Freq: Every morning | ORAL | Status: DC
  Administered 2014-08-17: 100 mg via ORAL
  Filled 2014-08-16: qty 2

## 2014-08-16 MED ORDER — HYDROMORPHONE HCL 1 MG/ML IJ SOLN: 0.2000 mg | INTRAMUSCULAR | Status: DC | PRN

## 2014-08-16 MED ORDER — LACTATED RINGERS IR SOLN
Status: DC | PRN
  Administered 2014-08-16: 300 mL

## 2014-08-16 MED ORDER — DEXAMETHASONE SODIUM PHOSPHATE 10 MG/ML IJ SOLN
INTRAMUSCULAR | Status: AC
Start: 2014-08-16 — End: 2014-08-16
  Filled 2014-08-16: qty 1

## 2014-08-16 MED ORDER — MEPERIDINE HCL 50 MG/ML IJ SOLN: 6.2500 mg | INTRAMUSCULAR | Status: DC | PRN

## 2014-08-16 MED ORDER — GLYCOPYRROLATE 0.2 MG/ML IJ SOLN
INTRAMUSCULAR | Status: AC
  Filled 2014-08-16: qty 3

## 2014-08-16 SURGICAL SUPPLY — 53 items
APPLICATOR SURGIFLO ENDO (HEMOSTASIS) ×2 IMPLANT
CABLE HIGH FREQUENCY MONO STRZ (ELECTRODE) ×2 IMPLANT
CHLORAPREP W/TINT 26ML (MISCELLANEOUS) ×4 IMPLANT
CORDS BIPOLAR (ELECTRODE) IMPLANT
COVER SURGICAL LIGHT HANDLE (MISCELLANEOUS) IMPLANT
COVER TIP SHEARS 8 DVNC (MISCELLANEOUS) ×1 IMPLANT
COVER TIP SHEARS 8MM DA VINCI (MISCELLANEOUS) ×1
DRAPE ARM DVNC X/XI (DISPOSABLE) ×4 IMPLANT
DRAPE COLUMN DVNC XI (DISPOSABLE) ×1 IMPLANT
DRAPE DA VINCI XI ARM (DISPOSABLE) ×4
DRAPE DA VINCI XI COLUMN (DISPOSABLE) ×1
DRAPE SHEET LG 3/4 BI-LAMINATE (DRAPES) ×4 IMPLANT
DRAPE SURG IRRIG POUCH 19X23 (DRAPES) ×2 IMPLANT
DRAPE TABLE BACK 44X90 PK DISP (DRAPES) ×4 IMPLANT
DRAPE WARM FLUID 44X44 (DRAPE) ×2 IMPLANT
DRSG TEGADERM 6X8 (GAUZE/BANDAGES/DRESSINGS) ×4 IMPLANT
ELECT REM PT RETURN 9FT ADLT (ELECTROSURGICAL) ×2
ELECTRODE REM PT RTRN 9FT ADLT (ELECTROSURGICAL) ×1 IMPLANT
FLOSEAL 10ML (HEMOSTASIS) ×2 IMPLANT
GAUZE SPONGE 4X4 16PLY XRAY LF (GAUZE/BANDAGES/DRESSINGS) ×2 IMPLANT
GLOVE BIO SURGEON STRL SZ 6 (GLOVE) ×6 IMPLANT
GLOVE BIO SURGEON STRL SZ 6.5 (GLOVE) ×4 IMPLANT
GOWN STRL REUS W/ TWL LRG LVL3 (GOWN DISPOSABLE) ×3 IMPLANT
GOWN STRL REUS W/TWL LRG LVL3 (GOWN DISPOSABLE) ×3
HOLDER FOLEY CATH W/STRAP (MISCELLANEOUS) ×2 IMPLANT
KIT ACCESSORY DA VINCI DISP (KITS)
KIT ACCESSORY DVNC DISP (KITS) IMPLANT
KIT BASIN OR (CUSTOM PROCEDURE TRAY) ×2 IMPLANT
LIQUID BAND (GAUZE/BANDAGES/DRESSINGS) ×2 IMPLANT
MANIPULATOR UTERINE 4.5 ZUMI (MISCELLANEOUS) ×2 IMPLANT
OCCLUDER COLPOPNEUMO (BALLOONS) ×2 IMPLANT
PEN SKIN MARKING BROAD (MISCELLANEOUS) ×2 IMPLANT
POUCH SPECIMEN RETRIEVAL 10MM (ENDOMECHANICALS) ×8 IMPLANT
SEAL CANN UNIV 5-8 DVNC XI (MISCELLANEOUS) ×4 IMPLANT
SEAL XI 5MM-8MM UNIVERSAL (MISCELLANEOUS) ×4
SET TUBE IRRIG SUCTION NO TIP (IRRIGATION / IRRIGATOR) ×2 IMPLANT
SHEET LAVH (DRAPES) ×2 IMPLANT
SOLUTION ELECTROLUBE (MISCELLANEOUS) ×2 IMPLANT
SUT VIC AB 0 CT1 27 (SUTURE) ×1
SUT VIC AB 0 CT1 27XBRD ANTBC (SUTURE) ×1 IMPLANT
SUT VIC AB 4-0 PS2 27 (SUTURE) ×4 IMPLANT
SUT VICRYL 0 UR6 27IN ABS (SUTURE) ×2 IMPLANT
SYR 50ML LL SCALE MARK (SYRINGE) ×2 IMPLANT
TOWEL OR 17X26 10 PK STRL BLUE (TOWEL DISPOSABLE) ×4 IMPLANT
TOWEL OR NON WOVEN STRL DISP B (DISPOSABLE) ×2 IMPLANT
TRAP SPECIMEN MUCOUS 40CC (MISCELLANEOUS) IMPLANT
TRAY FOLEY W/METER SILVER 14FR (SET/KITS/TRAYS/PACK) ×2 IMPLANT
TRAY LAPAROSCOPIC (CUSTOM PROCEDURE TRAY) ×2 IMPLANT
TROCAR 12M 150ML BLUNT (TROCAR) ×2 IMPLANT
TROCAR BLADELESS OPT 5 100 (ENDOMECHANICALS) ×2 IMPLANT
TROCAR XCEL 12X100 BLDLESS (ENDOMECHANICALS) ×2 IMPLANT
TUBING INSUFFLATION 10FT LAP (TUBING) ×2 IMPLANT
WATER STERILE IRR 1500ML POUR (IV SOLUTION) ×4 IMPLANT

## 2014-08-16 NOTE — Anesthesia Preprocedure Evaluation (Signed)
Anesthesia Evaluation  Patient identified by MRN, date of birth, ID band Patient awake    Reviewed: Allergy & Precautions, NPO status , Patient's Chart, lab work & pertinent test results  Airway Mallampati: II  TM Distance: >3 FB Neck ROM: Full    Dental no notable dental hx.    Pulmonary sleep apnea ,  breath sounds clear to auscultation  Pulmonary exam normal       Cardiovascular hypertension, Pt. on medications + pacemaker Rhythm:Regular Rate:Normal     Neuro/Psych negative neurological ROS  negative psych ROS   GI/Hepatic negative GI ROS, Neg liver ROS,   Endo/Other  diabetes, Type 2, Insulin Dependent  Renal/GU negative Renal ROS  negative genitourinary   Musculoskeletal negative musculoskeletal ROS (+)   Abdominal   Peds negative pediatric ROS (+)  Hematology negative hematology ROS (+)   Anesthesia Other Findings   Reproductive/Obstetrics negative OB ROS                             Anesthesia Physical Anesthesia Plan  ASA: III  Anesthesia Plan: General   Post-op Pain Management:    Induction: Intravenous  Airway Management Planned: Oral ETT  Additional Equipment:   Intra-op Plan:   Post-operative Plan: Extubation in OR  Informed Consent: I have reviewed the patients History and Physical, chart, labs and discussed the procedure including the risks, benefits and alternatives for the proposed anesthesia with the patient or authorized representative who has indicated his/her understanding and acceptance.   Dental advisory given  Plan Discussed with: CRNA  Anesthesia Plan Comments:         Anesthesia Quick Evaluation

## 2014-08-16 NOTE — H&P (Signed)
Consult was requested by Dr. Radene Knee for the evaluation of Summer Wong 81 y.o. female with endometrial cancer  CC:  Chief Complaint  Patient presents with  . endometrial cancer    Assessment/Plan:  Summer Wong is a 81 y.o. year old with clinical stage II high grade (grade 3) endometrial cancer.   A detailed discussion was held with the patient and her family with regard to to her endometrial cancer diagnosis. We discussed the standard management options for uterine cancer which includes surgery followed possibly by adjuvant therapy depending on the results of surgery. The options for surgical management include a hysterectomy and removal of the tubes and ovaries possibly with removal of pelvic and para-aortic lymph nodes. Despite visible endocervical involvement of the tumor, the cervical stroma and parametria are palpably clear, and I do not believe a radical hysterectomy is needed to achieve negative margins. A minimally invasive approach including a robotic hysterectomy or laparoscopic hysterectomy have benefits including shorter hospital stay, recovery time and better wound healing. The alternative approach is an open hysterectomy. The patient has been counseled about these surgical options and the risks of surgery in general including infection, bleeding, damage to surrounding structures (including bowel, bladder, ureters, nerves or vessels), and the postoperative risks of PE/ DVT, and lymphedema. I extensively reviewed the additional risks of robotic hysterectomy including possible need for conversion to open laparotomy. I discussed positioning during surgery of trendelenberg and risks of minor facial swelling and care we take in preoperative positioning. After counseling and consideration of her options, she desires to proceed with robotic assisted hysterectomy, BSO, lymphadenectomy.  I discussed that postoperatively she will certainly require at least radiation for her stage II  disease. I discussed that the addition of chemotherapy will be pending her staging results and cell type evaluation.   We will assess a preoperative CT of chest, abdomen and pelvis to rule out distant metastatic disease that would contraindicate upfront surgery or a minimally invasive approach.  She will be seen by anesthesia for preoperative clearance and discussion of postoperative pain management. She takes coumadin for a fib. We recommend stopping this preoperatively (7 days preop) and will check a PT/INR within 48 hours of surgery to confirm a normalized coag profile. She does not require bridging anticoagulation. She was given the opportunity to ask questions, which were answered to her satisfaction, and she is agreement with the above mentioned plan of care.   HPI: Summer Wong is an 81 year old G2P2 who is seen in consultation at the request of Dr Radene Knee for grade 3, clinical stage II endometrial cancer. The patient began having postmenopausal bleeding within the past year. Dr Radene Knee performed a transvaginal US on 05/08/14 which showed an 8.4x3.8x2.3cm uterus with a thickened 1cm endometrial stripe. On 06/19/14 he performed endometrial and endocervical sampling (of a visible endocervical lesion). Both confirmed FIGO grade 3 endometrioid adenocarcinoma.   Interval History: She has had no further spotting since this biopsy.  Current Meds:  Outpatient Encounter Prescriptions as of 07/11/2014  Medication Sig  . Acetaminophen (TYLENOL EXTRA STRENGTH PO) Take by mouth as needed.  Marland Kitchen albuterol (PROVENTIL HFA;VENTOLIN HFA) 108 (90 BASE) MCG/ACT inhaler Inhale 1-2 puffs into the lungs every 6 (six) hours as needed for wheezing or shortness of breath.  Marland Kitchen amLODipine (NORVASC) 10 MG tablet Take 10 mg by mouth daily.  Marland Kitchen atorvastatin (LIPITOR) 40 MG tablet Take 40 mg by mouth daily.  . carvedilol (COREG) 12.5 MG tablet Take 12.5 mg by mouth  2 (two) times daily with a meal.  . cetirizine  (ZYRTEC) 10 MG tablet Take 10 mg by mouth daily.  . cholecalciferol (VITAMIN D) 1000 UNITS tablet Take 1,000 Units by mouth 2 (two) times daily.  . insulin detemir (LEVEMIR) 100 UNIT/ML injection Inject 43 Units into the skin at bedtime.  Marland Kitchen losartan (COZAAR) 100 MG tablet Take 100 mg by mouth daily.  . Multiple Vitamins-Minerals (PRESERVISION AREDS) CAPS Take 1 capsule by mouth 2 (two) times daily.  . Omega-3 Fatty Acids (FISH OIL) 1000 MG CAPS Take 1,000 mg by mouth 2 (two) times daily.  Marland Kitchen omeprazole (PRILOSEC) 20 MG capsule Take 20 mg by mouth daily.  Marland Kitchen warfarin (COUMADIN) 3 MG tablet Take 3 mg by mouth See admin instructions. 3mg  all days but Sunday  . fluconazole (DIFLUCAN) 100 MG tablet Take 1.5 tablets (150 mg total) by mouth daily.  . nitroGLYCERIN (NITROSTAT) 0.4 MG SL tablet Place 0.4 mg under the tongue every 5 (five) minutes as needed for chest pain.  Marland Kitchen nystatin (MYCOSTATIN/NYSTOP) 100000 UNIT/GM POWD Apply 1 Bottle topically 2 (two) times daily.  . [DISCONTINUED] traMADol (ULTRAM) 50 MG tablet Take 50 mg by mouth every 6 (six) hours as needed for moderate pain.    Allergy:  Allergies  Allergen Reactions  . Sulfa Antibiotics   . Vasotec [Enalaprilat]     Social Hx:  History   Social History  . Marital Status: Widowed    Spouse Name: N/A  . Number of Children: N/A  . Years of Education: N/A   Occupational History  . Not on file.   Social History Main Topics  . Smoking status: Never Smoker   . Smokeless tobacco: Not on file  . Alcohol Use: No  . Drug Use: No  . Sexual Activity: Not on file   Other Topics Concern  . Not on file   Social History Narrative    Past Surgical Hx:  Past Surgical History  Procedure Laterality Date  . Pacemaker insertion    . Cholecystectomy    . Bladder suspension      Past Medical Hx:  Past Medical History   Diagnosis Date  . Atrial fibrillation   . Bradycardia   . Diabetes mellitus without complication   . Hypertension   . Hyperlipemia     Past Gynecological History: G2P2, postmenopausal. Hx of bladder sling placed for incontinence. Persistent incontinence. No LMP recorded.  Family Hx:  Family History  Problem Relation Age of Onset  . Stomach cancer Father   . Colon cancer Brother     Review of Systems:  Constitutional  Feels well,  ENT Normal appearing ears and nares bilaterally Skin/Breast  No rash, sores, jaundice, itching, dryness Cardiovascular  No chest pain, shortness of breath, or edema  Pulmonary  No cough or wheeze.  Gastro Intestinal  No nausea, vomitting, or diarrhoea. No bright red blood per rectum, no abdominal pain, change in bowel movement, or constipation.  Genito Urinary  No frequency, urgency, dysuria, + urinary incontinence. + post menopausal bleeding Musculo Skeletal  No myalgia, arthralgia, joint swelling or pain  Neurologic  No weakness, numbness, change in gait,  Psychology  No depression, anxiety, insomnia.   Vitals: Blood pressure 170/59, pulse 60, temperature 0 F (-17.8 C), resp. rate 22, height 5\' 2"  (1.575 m), weight 194 lb 11.2 oz (88.315 kg).  Physical Exam: WD in NAD Neck  Supple NROM, without any enlargements.  Lymph Node Survey No cervical supraclavicular or inguinal adenopathy Cardiovascular  Pulse normal rate, regularity and rhythm. S1 and S2 normal.  Lungs  Clear to auscultation bilateraly, without wheezes/crackles/rhonchi. Good air movement.  Skin  Candidiasis (red rash with satellite lesions measuring 30cm in left pannus/thigh fold). Psychiatry  Alert and oriented to person, place, and time  Abdomen  Normoactive bowel sounds, abdomen soft, non-tender and obese without evidence of hernia.  Back No CVA tenderness Genito Urinary  Vulva/vagina: Normal external  female genitalia. No lesions. No discharge or bleeding. Vulvovaginal candidiasis visible. Bladder/urethra: No lesions or masses, well supported bladder Vagina: normal, redundant, prolapse. Cervix: Smooth ectocervix. Endocervix with papillary tissue within the canal. Smooth and soft cervix on palpation (not palpably infiltrated with tumor). No parametrial tumor palpable. Uterus: Small, mobile, no parametrial involvement or nodularity. Adnexa: no palpable masses. Rectal  Good tone, no masses no cul de sac nodularity.  Extremities  No bilateral cyanosis, clubbing or edema.   Donaciano Eva, MD

## 2014-08-16 NOTE — Anesthesia Procedure Notes (Signed)
Procedure Name: Intubation Date/Time: 08/16/2014 10:40 AM Performed by: Johnathan Hausen A Pre-anesthesia Checklist: Patient identified, Timeout performed, Emergency Drugs available, Suction available and Patient being monitored Patient Re-evaluated:Patient Re-evaluated prior to inductionOxygen Delivery Method: Circle system utilized Preoxygenation: Pre-oxygenation with 100% oxygen Intubation Type: Combination inhalational/ intravenous induction Ventilation: Mask ventilation without difficulty Laryngoscope Size: Mac and 4 Grade View: Grade I Tube type: Oral Number of attempts: 1 Airway Equipment and Method: Stylet Placement Confirmation: ETT inserted through vocal cords under direct vision,  breath sounds checked- equal and bilateral and positive ETCO2 Secured at: 20 cm Tube secured with: Tape Dental Injury: Injury to lip and Teeth and Oropharynx as per pre-operative assessment

## 2014-08-16 NOTE — Anesthesia Postprocedure Evaluation (Signed)
  Anesthesia Post-op Note  Patient: Summer Wong  Procedure(s) Performed: Procedure(s) (LRB):  ROBOTIC ASSISTED TOTAL HYSTERECTOMY WITH BILATERAL SALPINGO OOPHORECTOMY AND LYMPHADENECTOMY (Bilateral)  Patient Location: PACU  Anesthesia Type: General  Level of Consciousness: awake and alert   Airway and Oxygen Therapy: Patient Spontanous Breathing  Post-op Pain: mild  Post-op Assessment: Post-op Vital signs reviewed, Patient's Cardiovascular Status Stable, Respiratory Function Stable, Patent Airway and No signs of Nausea or vomiting  Last Vitals:  Filed Vitals:   08/16/14 1534  BP: 116/78  Pulse: 69  Temp: 36.5 C  Resp: 18    Post-op Vital Signs: stable   Complications: No apparent anesthesia complications

## 2014-08-16 NOTE — Progress Notes (Signed)
Pt arrived to room 1325 from PACU.  Pt A&O, reported brief nausea with belching, but nausea passed.  Tolerated a handful of ice chips.  Assisted to dangle legs off of bed.  Assisted to bedside commode, passed gas, no BM.  Assisted back to bed, sat on edge of bed with RN present for approx 5 minutes before laying back in bed.  Currently denies pain and is resting comfortably.  Awaiting son and daughter to come to room.  Will continue to monitor pt.

## 2014-08-16 NOTE — Transfer of Care (Signed)
Immediate Anesthesia Transfer of Care Note  Patient: Summer Wong  Procedure(s) Performed: Procedure(s):  ROBOTIC ASSISTED TOTAL HYSTERECTOMY WITH BILATERAL SALPINGO OOPHORECTOMY AND LYMPHADENECTOMY (Bilateral)  Patient Location: PACU  Anesthesia Type:General  Level of Consciousness: awake, sedated and patient cooperative  Airway & Oxygen Therapy: Patient Spontanous Breathing and Patient connected to face mask oxygen  Post-op Assessment: Report given to RN and Post -op Vital signs reviewed and stable  Post vital signs: Reviewed and stable  Last Vitals: There were no vitals filed for this visit.  Complications: No apparent anesthesia complications

## 2014-08-16 NOTE — Op Note (Signed)
OPERATIVE NOTE  Surgeon: Donaciano Eva   Assistants: Lahoma Crocker, MD. (an MD assistant was necessary for tissue manipulation, management of robotic instrumentation, retraction and positioning due to the complexity of the case and hospital policies).   Anesthesia: General endotracheal anesthesia  ASA Class: 3   Pre-operative Diagnosis: stage II (clinical) grade 3 endometrial cancer  Post-operative Diagnosis: same  Operation: Robotic-assisted laparoscopic hysterectomy with bilateral salpingoophorectomy, bilateral pelvic and para-aortic lymphadenectomy  Surgeon: Donaciano Eva  Assistant Surgeon: Lahoma Crocker MD  Anesthesia: GET  Urine Output: 300cc  Operative Findings:  : 6 week size uterus. Normal appearing tubes and ovaries. No suspicious lymph nodes. Tumor seen extruding through endocervical os.  Estimated Blood Loss:  100cc      Total IV Fluids: 1,000 ml         Specimens: washings, uterus, bilateral tubes and ovaries, bilateral pelvic and PA nodes.         Complications:  None; patient tolerated the procedure well.         Disposition: PACU - hemodynamically stable.  Procedure Details  The patient was seen in the Holding Room. The risks, benefits, complications, treatment options, and expected outcomes were discussed with the patient.  The patient concurred with the proposed plan, giving informed consent.  The site of surgery properly noted/marked. The patient was identified as Summer Wong and the procedure verified as a Robotic-assisted hysterectomy with bilateral salpingo oophorectomy and lymphadenectomy. A Time Out was held and the above information confirmed.  After induction of anesthesia, the patient was draped and prepped in the usual sterile manner. Pt was placed in supine position after anesthesia and draped and prepped in the usual sterile manner. The abdominal drape was placed after the CholoraPrep had been allowed to dry for 3 minutes.   Her arms were tucked to her side with all appropriate precautions.  The chest was secured to the table.  The patient was placed in the semi-lithotomy position in [place].  The perineum was prepped with Betadine.  Foley catheter was placed.  A sterile speculum was placed in the vagina.  The cervix was grasped with a single-tooth tenaculum and dilated with Kennon Rounds dilators.  The ZUMI uterine manipulator with a medium colpotomizer ring was placed without difficulty.  A pneum occluder balloon was placed over the manipulator.  A second time-out was performed.  OG tube placement was confirmed and to suction.    Procedure:  The patient was brought to the operating room where general anesthesia was administered with no complications.  The patient was placed in the dorsal lithotomy position in padded Allen stirrups.  The arms were tucked at the sides with gel pads protecting the elbows and foam protecting the hands. The patient was then prepped.  A Foley was placed to gravity.  A medium size KOH ring was used to place around the cervix after the cervix had been dilated and then a RUMI manipulator was attached in the normal manner.  The patient was then draped in the normal manner.  Next, a 5 mm skin incision was made 1 cm below the subcostal margin in the midclavicular line.  The 5 mm Optiview port and scope was used for direct entry.  Opening pressure was under 10 mm CO2.  The abdomen was insufflated and the findings were noted as above.   At this point and all points during the procedure, the patient's intra-abdominal pressure did not exceed 15 mmHg. Next, a 10 mm skin incision was  made 4 finger breadths above the umbilicus and a right and left port was placed about 10 cm lateral to the robot port on the right and left side.  A fourth arm was placed in the left lower quadrant 2 cm above and superior and medial to the anterior superior iliac spine.  All ports were placed under direct visualization.  The patient was  placed in steep Trendelenburg.  Bowel was away into the upper abdomen.  The robot was docked in the normal manner.  The para-aortic lymph node dissection was performed first on the right. The peritoneum overlying the lateral surface of the common iliac and vena cava was opened vertically towards the duodenum. It was elevated as a shelf and the ureter was identified in this retroperitoneum. It was mobilized and retracted laterally with the 4th arm. The right para-aortic dissection took place by separating an enbloc segment of node containing fat from the mid portion of the common iliac distally, the retroperitoneal duodenum superiorally, the genitofemoral nerve laterally and the aorta medially.  Hemostasis was confirmed and was reinforced with Floseal.  The left para-aortic lymphadenectomy was performed by elevating the peritoneal edge and mobilizing the sigmoid mesentery from the underlying aorta. The left para-aortic retroperitoneal space (between the sigmoid mesentery and the lymph node bundle) was developed and the left ureter was identified and retracted with the forth arm. The left para-aortic nodes were removed en bloc by using monopolar and sharp dissection to separate all nodal fatty tissue between the mid portion of the left common iliac artery inferiorally and the IMA superiorally, the aorta medially, the vertebral bodies posteriorally and the tendon of the psoas muscle laterally.  The hysterectomy was started after the round ligament on the right side was incised and the retroperitoneum was entered and the pararectal space was developed.  The ureter was noted to be on the medial leaf of the broad ligament.  The peritoneum above the ureter was incised and stretched and the infundibulopelvic ligament was skeletonized, cauterized and cut.  The posterior peritoneum was taken down to the level of the KOH ring.  The anterior peritoneum was also taken down.  The bladder flap was created to the level of the  KOH ring.  The uterine artery on the right side was skeletonized, cauterized and cut in the normal manner.  A similar procedure was performed on the left.  The colpotomy was made and the uterus, cervix, bilateral ovaries and tubes were amputated and delivered through the vagina.  Pedicles were inspected and excellent hemostasis was achieved.    The right  paravesical space was developed with monopolar and sharp dissection. It was held open with tension on the median umbilical ligament with the forth arm. The paravesical space was opened with blunt and sharp dissection to mobilize the ureter off of the medial surface of the internal iliac artery. The medial leaf of the broad ligament containing the ureter was held medially (opening the pararectal space) by the assistant's grasper. The right pelvic lymphadenectomy was performed by skeletonizing the internal iliac artery at the bifurcation with the external iliac artery. The obturator nerve was identified in the base of lateral paravesical space. The ureter was mobilized medially off of the dissection by developing the pararectal space. The genitofemoral nerve was identified, skeletonized and mobilized laterally off of the external iliac artery. An enbloc resection of lymph nodes was performed within the following boundaries: the mid portion of the common iliac proximally, the circumflex iliac vein distally, the obturator  nerve posteriorally, the genitofemoral nerve laterally. The nodal basin (including obturator space) were confirmed to be empty of nodes and hemostatic. The nodes were placed in an endocatch bag and retrieved vaginally.  An identical lymphadenectomy was then performed on the left with the previously stated technique and surgical boundaries. Hemostasis was observed.  All specimen bags and raytec's were removed from the abdomen.  The colpotomy at the vaginal cuff was closed with Vicryl on a CT1 needle in a running manner.  Irrigation was used and  excellent hemostasis was achieved.  At this point in the procedure was completed.  Robotic instruments were removed under direct visulaization.  The robot was undocked. The 10 mm ports were closed with Vicryl on a UR-5 needle and the fascia was closed with 0 Vicryl on a UR-5 needle.  The skin was closed with 4-0 Vicryl in a subcuticular manner.  Steri-Strips were applied and bandages were also applied.  Sponge, lap and needle counts correct x 2.  The patient was taken to the recovery room in stable condition.  The vagina was swabbed with  minimal bleeding noted.   All instrument and needle counts were correct x  3.   The patient was transferred to the recovery room in a stable condition.  Donaciano Eva, MD

## 2014-08-17 ENCOUNTER — Encounter (HOSPITAL_COMMUNITY): Payer: Self-pay | Admitting: Gynecologic Oncology

## 2014-08-17 DIAGNOSIS — C541 Malignant neoplasm of endometrium: Secondary | ICD-10-CM | POA: Diagnosis not present

## 2014-08-17 LAB — CBC
HEMATOCRIT: 32.4 % — AB (ref 36.0–46.0)
HEMOGLOBIN: 10.3 g/dL — AB (ref 12.0–15.0)
MCH: 27.8 pg (ref 26.0–34.0)
MCHC: 31.8 g/dL (ref 30.0–36.0)
MCV: 87.6 fL (ref 78.0–100.0)
Platelets: 202 10*3/uL (ref 150–400)
RBC: 3.7 MIL/uL — ABNORMAL LOW (ref 3.87–5.11)
RDW: 14 % (ref 11.5–15.5)
WBC: 8.8 10*3/uL (ref 4.0–10.5)

## 2014-08-17 LAB — BASIC METABOLIC PANEL
Anion gap: 4 — ABNORMAL LOW (ref 5–15)
BUN: 22 mg/dL (ref 6–23)
CO2: 28 mmol/L (ref 19–32)
Calcium: 8.1 mg/dL — ABNORMAL LOW (ref 8.4–10.5)
Chloride: 106 mmol/L (ref 96–112)
Creatinine, Ser: 1.57 mg/dL — ABNORMAL HIGH (ref 0.50–1.10)
GFR calc Af Amer: 33 mL/min — ABNORMAL LOW (ref >90)
GFR calc non Af Amer: 29 mL/min — ABNORMAL LOW (ref >90)
GLUCOSE: 82 mg/dL (ref 70–99)
POTASSIUM: 4.3 mmol/L (ref 3.5–5.1)
Sodium: 138 mmol/L (ref 135–145)

## 2014-08-17 LAB — GLUCOSE, CAPILLARY
GLUCOSE-CAPILLARY: 120 mg/dL — AB (ref 70–99)
Glucose-Capillary: 69 mg/dL — ABNORMAL LOW (ref 70–99)
Glucose-Capillary: 95 mg/dL (ref 70–99)

## 2014-08-17 MED ORDER — ENOXAPARIN SODIUM 30 MG/0.3ML ~~LOC~~ SOLN
30.0000 mg | SUBCUTANEOUS | Status: DC
  Filled 2014-08-17: qty 0.3

## 2014-08-17 MED ORDER — OXYCODONE-ACETAMINOPHEN 5-325 MG PO TABS: 1.0000 | ORAL_TABLET | ORAL | Status: DC | PRN

## 2014-08-17 MED ORDER — TRAMADOL HCL 50 MG PO TABS: 50.0000 mg | ORAL_TABLET | Freq: Four times a day (QID) | ORAL | Status: DC | PRN

## 2014-08-17 MED ORDER — DOCUSATE SODIUM 100 MG PO CAPS: 100.0000 mg | ORAL_CAPSULE | Freq: Two times a day (BID) | ORAL | Status: DC

## 2014-08-17 NOTE — Progress Notes (Signed)
Changed order to [facility] mod diet due to no n/v

## 2014-08-17 NOTE — Discharge Summary (Signed)
Physician Discharge Summary  Patient ID: Summer Wong MRN: [id number] DOB/AGE: [birth year] 79 y.o.  Admit date: 08/16/2014 Discharge date: 08/17/2014  Admission Diagnoses: <principal problem not specified>  Discharge Diagnoses:  Active Problems:   FIGO stage II endometrial cancer   Endometrial cancer   Discharged Condition: good  Hospital Course: Patient was admitted on 08/16/14 for surgery (Robotic hysterectomy, BSO, pelvic and PA node dissection) for stage II grade 3 endometrial cancer. Surgery was uncomplicated. She did well postop and was tolerating regular food and her pain was well controlled on POD1. Postoperative Hb was appropriate.  Consults: None  Significant Diagnostic Studies:  CBC    Component Value Date/Time   WBC 8.8 08/17/2014 0438   RBC 3.70* 08/17/2014 0438   HGB 10.3* 08/17/2014 0438   HCT 32.4* 08/17/2014 0438   PLT 202 08/17/2014 0438   MCV 87.6 08/17/2014 0438   MCH 27.8 08/17/2014 0438   MCHC 31.8 08/17/2014 0438   RDW 14.0 08/17/2014 0438   LYMPHSABS 3.4 08/09/2014 1120   MONOABS 0.4 08/09/2014 1120   EOSABS 0.3 08/09/2014 1120   BASOSABS 0.0 08/09/2014 1120    Treatments: surgery - see above  Discharge Exam: Blood pressure 133/68, pulse 60, temperature 98.2 F (36.8 C), temperature source Oral, resp. rate 18, height 5\' 3"  (1.6 m), weight 192 lb (87.091 kg), SpO2 100 %. General appearance: alert and appears stated age Resp: clear to auscultation bilaterally Cardio: regular rate and rhythm, S1, S2 normal, no murmur, click, rub or gallop GI: soft, non-tender; bowel sounds normal; no masses,  no organomegaly Extremities: extremities normal, atraumatic, no cyanosis or edema Incision/Wound: incisions clean and dry with no sign of drainage  Disposition: 01-Home or Self Care  Discharge Instructions    (HEART FAILURE PATIENTS) Call MD:  Anytime you have any of the following symptoms: 1) 3 pound weight gain in 24 hours or 5 pounds in 1 week 2)  shortness of breath, with or without a dry hacking cough 3) swelling in the hands, feet or stomach 4) if you have to sleep on extra pillows at night in order to breathe.    Complete by:  As directed      Call MD for:  difficulty breathing, headache or visual disturbances    Complete by:  As directed      Call MD for:  extreme fatigue    Complete by:  As directed      Call MD for:  hives    Complete by:  As directed      Call MD for:  persistant dizziness or light-headedness    Complete by:  As directed      Call MD for:  persistant nausea and vomiting    Complete by:  As directed      Call MD for:  redness, tenderness, or signs of infection (pain, swelling, redness, odor or green/yellow discharge around incision site)    Complete by:  As directed      Call MD for:  severe uncontrolled pain    Complete by:  As directed      Call MD for:  temperature >100.4    Complete by:  As directed      Diet - low sodium heart healthy    Complete by:  As directed      Diet general    Complete by:  As directed      Driving Restrictions    Complete by:  As directed   No driving for 7 days  or until off narcotic pain medication     Increase activity slowly    Complete by:  As directed      Remove dressing in 24 hours    Complete by:  As directed      Sexual Activity Restrictions    Complete by:  As directed   No intercourse for 6 weeks            Medication List    TAKE these medications        acetaminophen 500 MG tablet  Commonly known as:  TYLENOL  Take 1,000 mg by mouth every 6 (six) hours as needed for moderate pain or headache.     albuterol 108 (90 BASE) MCG/ACT inhaler  Commonly known as:  PROVENTIL HFA;VENTOLIN HFA  Inhale 1-2 puffs into the lungs every 6 (six) hours as needed for wheezing or shortness of breath.     amLODipine 10 MG tablet  Commonly known as:  NORVASC  Take 10 mg by mouth every morning.     atorvastatin 40 MG tablet  Commonly known as:  LIPITOR  Take 40  mg by mouth every morning.     carvedilol 12.5 MG tablet  Commonly known as:  COREG  Take 12.5 mg by mouth 2 (two) times daily with a meal.     cetirizine 10 MG tablet  Commonly known as:  ZYRTEC  Take 10 mg by mouth every morning.     cholecalciferol 1000 UNITS tablet  Commonly known as:  VITAMIN D  Take 1,000 Units by mouth 2 (two) times daily.     docusate sodium 100 MG capsule  Commonly known as:  COLACE  Take 1 capsule (100 mg total) by mouth 2 (two) times daily.     fenofibrate 145 MG tablet  Commonly known as:  TRICOR  Take 145 mg by mouth every morning.     ferrous sulfate 325 (65 FE) MG tablet  Take 325 mg by mouth daily with breakfast.     Fish Oil 1000 MG Caps  Take 1,000 mg by mouth 2 (two) times daily.     insulin glargine 100 UNIT/ML injection  Commonly known as:  LANTUS  Inject 43 Units into the skin at bedtime.     losartan 100 MG tablet  Commonly known as:  COZAAR  Take 100 mg by mouth every morning.     nitroGLYCERIN 0.4 MG SL tablet  Commonly known as:  NITROSTAT  Place 0.4 mg under the tongue every 5 (five) minutes as needed for chest pain.     omeprazole 20 MG capsule  Commonly known as:  PRILOSEC  Take 20 mg by mouth every morning.     oxyCODONE-acetaminophen 5-325 MG per tablet  Commonly known as:  PERCOCET/ROXICET  Take 1-2 tablets by mouth every 4 (four) hours as needed (moderate to severe pain (when tolerating fluids)).     PRESERVISION AREDS Caps  Take 1 capsule by mouth 2 (two) times daily.     traMADol 50 MG tablet  Commonly known as:  ULTRAM  Take 1 tablet (50 mg total) by mouth every 6 (six) hours as needed for moderate pain.     warfarin 3 MG tablet  Commonly known as:  COUMADIN  Take 3 mg by mouth See admin instructions. 3mg  all days but Sunday         Signed: Donaciano Eva 08/17/2014, 9:17 AM

## 2014-08-17 NOTE — Discharge Instructions (Signed)
08/17/2014  Return to work: 6 weeks  Activity: 1. Be up and out of the bed during the day.  Take a nap if needed.  You may walk up steps but be careful and use the hand rail.  Stair climbing will tire you more than you think, you may need to stop part way and rest.   2. No lifting or straining for 6 weeks.  3. No driving for 2 weeks.  Do Not drive if you are taking narcotic pain medicine.  4. Shower daily.  Use soap and water on your incision and pat dry; don't rub.   5. No sexual activity and nothing in the vagina for 8 weeks.  Diet: 1. Low sodium Heart Healthy Diet is recommended.  2. It is safe to use a laxative if you have difficulty moving your bowels.   Wound Care: 1. Keep clean and dry.  Shower daily.  Reasons to call the Doctor:   Fever - Oral temperature greater than 100.4 degrees Fahrenheit  Foul-smelling vaginal discharge  Difficulty urinating  Nausea and vomiting  Increased pain at the site of the incision that is unrelieved with pain medicine.  Difficulty breathing with or without chest pain  New calf pain especially if only on one side  Sudden, continuing increased vaginal bleeding with or without clots.   Follow-up: 1. See Everitt Amber, MD in 1 month  Contacts: For questions or concerns you should contact:  Dr. Everitt Amber on [phone]

## 2014-08-17 NOTE — Progress Notes (Signed)
UR completed 

## 2014-08-30 ENCOUNTER — Emergency Department (HOSPITAL_COMMUNITY): Payer: Medicare Other

## 2014-08-30 ENCOUNTER — Emergency Department (HOSPITAL_COMMUNITY)
Admission: EM | Admit: 2014-08-30 | Discharge: 2014-08-30 | Disposition: A | Discharge status: Home / Self Care | Payer: Medicare Other | Attending: Emergency Medicine | Admitting: Emergency Medicine

## 2014-08-30 ENCOUNTER — Encounter (HOSPITAL_COMMUNITY): Payer: Self-pay | Admitting: *Deleted

## 2014-08-30 DIAGNOSIS — E785 Hyperlipidemia, unspecified: Secondary | ICD-10-CM | POA: Insufficient documentation

## 2014-08-30 DIAGNOSIS — Z794 Long term (current) use of insulin: Secondary | ICD-10-CM | POA: Diagnosis not present

## 2014-08-30 DIAGNOSIS — Z79899 Other long term (current) drug therapy: Secondary | ICD-10-CM | POA: Diagnosis not present

## 2014-08-30 DIAGNOSIS — Z87448 Personal history of other diseases of urinary system: Secondary | ICD-10-CM | POA: Insufficient documentation

## 2014-08-30 DIAGNOSIS — R0600 Dyspnea, unspecified: Secondary | ICD-10-CM | POA: Insufficient documentation

## 2014-08-30 DIAGNOSIS — K219 Gastro-esophageal reflux disease without esophagitis: Secondary | ICD-10-CM | POA: Insufficient documentation

## 2014-08-30 DIAGNOSIS — I10 Essential (primary) hypertension: Secondary | ICD-10-CM | POA: Insufficient documentation

## 2014-08-30 DIAGNOSIS — Z950 Presence of cardiac pacemaker: Secondary | ICD-10-CM | POA: Diagnosis not present

## 2014-08-30 DIAGNOSIS — E119 Type 2 diabetes mellitus without complications: Secondary | ICD-10-CM | POA: Insufficient documentation

## 2014-08-30 DIAGNOSIS — Z7901 Long term (current) use of anticoagulants: Secondary | ICD-10-CM | POA: Diagnosis not present

## 2014-08-30 DIAGNOSIS — M1990 Unspecified osteoarthritis, unspecified site: Secondary | ICD-10-CM | POA: Diagnosis not present

## 2014-08-30 DIAGNOSIS — R011 Cardiac murmur, unspecified: Secondary | ICD-10-CM | POA: Insufficient documentation

## 2014-08-30 DIAGNOSIS — R0602 Shortness of breath: Secondary | ICD-10-CM | POA: Diagnosis present

## 2014-08-30 DIAGNOSIS — Z8669 Personal history of other diseases of the nervous system and sense organs: Secondary | ICD-10-CM | POA: Insufficient documentation

## 2014-08-30 LAB — CBC WITH DIFFERENTIAL/PLATELET
BASOS PCT: 1 % (ref 0–1)
Basophils Absolute: 0.1 10*3/uL (ref 0.0–0.1)
EOS ABS: 1.1 10*3/uL — AB (ref 0.0–0.7)
EOS PCT: 11 % — AB (ref 0–5)
HCT: 34.8 % — ABNORMAL LOW (ref 36.0–46.0)
Hemoglobin: 11.3 g/dL — ABNORMAL LOW (ref 12.0–15.0)
LYMPHS PCT: 28 % (ref 12–46)
Lymphs Abs: 2.7 10*3/uL (ref 0.7–4.0)
MCH: 28.2 pg (ref 26.0–34.0)
MCHC: 32.5 g/dL (ref 30.0–36.0)
MCV: 86.8 fL (ref 78.0–100.0)
MONO ABS: 0.5 10*3/uL (ref 0.1–1.0)
Monocytes Relative: 5 % (ref 3–12)
Neutro Abs: 5.4 10*3/uL (ref 1.7–7.7)
Neutrophils Relative %: 55 % (ref 43–77)
Platelets: 342 10*3/uL (ref 150–400)
RBC: 4.01 MIL/uL (ref 3.87–5.11)
RDW: 14.2 % (ref 11.5–15.5)
WBC: 9.7 10*3/uL (ref 4.0–10.5)

## 2014-08-30 LAB — COMPREHENSIVE METABOLIC PANEL
ALT: 16 U/L (ref 14–54)
AST: 17 U/L (ref 15–41)
Albumin: 3.6 g/dL (ref 3.5–5.0)
Alkaline Phosphatase: 41 U/L (ref 38–126)
Anion gap: 8 (ref 5–15)
BILIRUBIN TOTAL: 0.2 mg/dL — AB (ref 0.3–1.2)
BUN: 30 mg/dL — AB (ref 6–20)
CO2: 26 mmol/L (ref 22–32)
CREATININE: 1.51 mg/dL — AB (ref 0.44–1.00)
Calcium: 8.9 mg/dL (ref 8.9–10.3)
Chloride: 108 mmol/L (ref 101–111)
GFR, EST AFRICAN AMERICAN: 35 mL/min — AB (ref >60)
GFR, EST NON AFRICAN AMERICAN: 30 mL/min — AB (ref >60)
Glucose, Bld: 118 mg/dL — ABNORMAL HIGH (ref 70–99)
Potassium: 3.8 mmol/L (ref 3.5–5.1)
Sodium: 142 mmol/L (ref 135–145)
Total Protein: 6.7 g/dL (ref 6.5–8.1)

## 2014-08-30 LAB — URINALYSIS, ROUTINE W REFLEX MICROSCOPIC
Bilirubin Urine: NEGATIVE
Glucose, UA: NEGATIVE mg/dL
KETONES UR: NEGATIVE mg/dL
Leukocytes, UA: NEGATIVE
Nitrite: NEGATIVE
PH: 7 (ref 5.0–8.0)
Protein, ur: 100 mg/dL — AB
Specific Gravity, Urine: 1.02 (ref 1.005–1.030)
UROBILINOGEN UA: 0.2 mg/dL (ref 0.0–1.0)

## 2014-08-30 LAB — TROPONIN I

## 2014-08-30 LAB — URINE MICROSCOPIC-ADD ON

## 2014-08-30 LAB — D-DIMER, QUANTITATIVE: D-Dimer, Quant: 3.47 ug/mL-FEU — ABNORMAL HIGH (ref 0.00–0.48)

## 2014-08-30 LAB — PROTIME-INR
INR: 1.99 — ABNORMAL HIGH (ref 0.00–1.49)
PROTHROMBIN TIME: 22.8 s — AB (ref 11.6–15.2)

## 2014-08-30 LAB — I-STAT TROPONIN, ED: Troponin i, poc: 0.01 ng/mL (ref 0.00–0.08)

## 2014-08-30 MED ORDER — CARVEDILOL 12.5 MG PO TABS: 25.0000 mg | ORAL_TABLET | Freq: Two times a day (BID) | ORAL | Status: DC

## 2014-08-30 MED ORDER — CEPHALEXIN 500 MG PO CAPS
500.0000 mg | ORAL_CAPSULE | Freq: Once | ORAL | Status: AC
  Administered 2014-08-30: 500 mg via ORAL
  Filled 2014-08-30: qty 1

## 2014-08-30 MED ORDER — IOHEXOL 350 MG/ML SOLN
100.0000 mL | Freq: Once | INTRAVENOUS | Status: AC | PRN
  Administered 2014-08-30: 80 mL via INTRAVENOUS

## 2014-08-30 MED ORDER — METOPROLOL TARTRATE 1 MG/ML IV SOLN
5.0000 mg | Freq: Once | INTRAVENOUS | Status: AC
  Administered 2014-08-30: 5 mg via INTRAVENOUS
  Filled 2014-08-30: qty 5

## 2014-08-30 NOTE — ED Notes (Signed)
MD at bedside. 

## 2014-08-30 NOTE — ED Notes (Signed)
Pt had a hysterectomy on 4/21. Per family she began becoming weak on Saturday, then Tuesday she started having SOB that increased today.  Pt has clear breath sounds but has increase WOB with pursed lip breathing.

## 2014-08-30 NOTE — Discharge Instructions (Signed)
Please call your primary doctor tomorrow and discuss increasing your coumadin.  Keep a log of your blood pressure to discuss with your doctor.  If you are worse, return for reevaluation.

## 2014-08-30 NOTE — ED Provider Notes (Signed)
CSN: [id number]     Arrival date & time 08/30/14  1814 History   First MD Initiated Contact with Patient 08/30/14 1816     Chief Complaint  Patient presents with  . Shortness of Breath     (Consider location/radiation/quality/duration/timing/severity/associated sxs/prior Treatment) HPI 81 year old female who presents today with fairly sudden onset shortness of breath. She states she took a nap at about 4:00 when she awoke she was very short of breath. She has had 2 previous episodes of similar symptoms this week that resolved spontaneously. She has recently had surgery for a hysterectomy. She is complaining of some right lower leg pain. She is on Coumadin which she resumed after surgery for her chronic A. Fib. She feels like she had may have had some sweats and a family member feels that she may have had some fever earlier this week. She has had some coughing but is been nonproductive. She's been eating and drinking without difficulty. Past Medical History  Diagnosis Date  . Atrial fibrillation   . Bradycardia   . Hypertension   . Hyperlipemia   . Diabetes mellitus without complication   . Sleep apnea     cpap - patient does not know settings   . Presence of permanent cardiac pacemaker   . Heart murmur   . Shortness of breath dyspnea     with exertion   . Stress incontinence   . GERD (gastroesophageal reflux disease)   . Arthritis   . Hepatitis     hx of Hep A - years ago    Past Surgical History  Procedure Laterality Date  . Pacemaker insertion    . Cholecystectomy    . Bladder suspension    . Tubal ligation    . Robotic assisted total hysterectomy with bilateral salpingo oopherectomy Bilateral 08/16/2014    Procedure:  ROBOTIC ASSISTED TOTAL HYSTERECTOMY WITH BILATERAL SALPINGO OOPHORECTOMY AND LYMPHADENECTOMY;  Surgeon: Everitt Amber, MD;  Location: [facility];  Service: Gynecology;  Laterality: Bilateral;   Family History  Problem Relation Age of Onset  . Stomach cancer Father    . Colon cancer Brother    History  Substance Use Topics  . Smoking status: Never Smoker   . Smokeless tobacco: Never Used  . Alcohol Use: No   OB History    No data available     Review of Systems  Constitutional: Positive for diaphoresis.  HENT: Negative.   Eyes: Negative.   Respiratory: Positive for shortness of breath.   Cardiovascular: Negative.   Gastrointestinal: Negative.   Endocrine: Negative.   Genitourinary: Negative.   Musculoskeletal: Negative.   Neurological: Negative.   Hematological: Negative.   Psychiatric/Behavioral: Negative.   All other systems reviewed and are negative.     Allergies  Sulfa antibiotics and Vasotec  Home Medications   Prior to Admission medications   Medication Sig Start Date End Date Taking? Authorizing Provider  acetaminophen (TYLENOL) 500 MG tablet Take 1,000 mg by mouth every 6 (six) hours as needed for moderate pain or headache.    Historical Provider, MD  albuterol (PROVENTIL HFA;VENTOLIN HFA) 108 (90 BASE) MCG/ACT inhaler Inhale 1-2 puffs into the lungs every 6 (six) hours as needed for wheezing or shortness of breath.    Historical Provider, MD  amLODipine (NORVASC) 10 MG tablet Take 10 mg by mouth every morning.     Historical Provider, MD  atorvastatin (LIPITOR) 40 MG tablet Take 40 mg by mouth every morning.     Historical Provider, MD  carvedilol (COREG) 12.5 MG tablet Take 12.5 mg by mouth 2 (two) times daily with a meal.    Historical Provider, MD  cetirizine (ZYRTEC) 10 MG tablet Take 10 mg by mouth every morning.     Historical Provider, MD  cholecalciferol (VITAMIN D) 1000 UNITS tablet Take 1,000 Units by mouth 2 (two) times daily.    Historical Provider, MD  docusate sodium (COLACE) 100 MG capsule Take 1 capsule (100 mg total) by mouth 2 (two) times daily. 08/17/14   Everitt Amber, MD  fenofibrate (TRICOR) 145 MG tablet Take 145 mg by mouth every morning.    Historical Provider, MD  ferrous sulfate 325 (65 FE) MG tablet  Take 325 mg by mouth daily with breakfast.    Historical Provider, MD  insulin glargine (LANTUS) 100 UNIT/ML injection Inject 43 Units into the skin at bedtime.    Historical Provider, MD  losartan (COZAAR) 100 MG tablet Take 100 mg by mouth every morning.     Historical Provider, MD  Multiple Vitamins-Minerals (PRESERVISION AREDS) CAPS Take 1 capsule by mouth 2 (two) times daily.    Historical Provider, MD  nitroGLYCERIN (NITROSTAT) 0.4 MG SL tablet Place 0.4 mg under the tongue every 5 (five) minutes as needed for chest pain.    Historical Provider, MD  Omega-3 Fatty Acids (FISH OIL) 1000 MG CAPS Take 1,000 mg by mouth 2 (two) times daily.    Historical Provider, MD  omeprazole (PRILOSEC) 20 MG capsule Take 20 mg by mouth every morning.     Historical Provider, MD  oxyCODONE-acetaminophen (PERCOCET/ROXICET) 5-325 MG per tablet Take 1-2 tablets by mouth every 4 (four) hours as needed (moderate to severe pain (when tolerating fluids)). 08/17/14   Everitt Amber, MD  traMADol (ULTRAM) 50 MG tablet Take 1 tablet (50 mg total) by mouth every 6 (six) hours as needed for moderate pain. 08/17/14   Everitt Amber, MD  warfarin (COUMADIN) 3 MG tablet Take 3 mg by mouth See admin instructions. 3mg  all days but Sunday    Historical Provider, MD   BP 197/76 mmHg  Pulse 60  Temp(Src) 98.5 F (36.9 C) (Oral)  Resp 24  Ht 5\' 3"  (1.6 m)  Wt 192 lb (87.091 kg)  BMI 34.02 kg/m2  SpO2 100% Physical Exam  Constitutional: She is oriented to person, place, and time. She appears well-developed and well-nourished.  HENT:  Head: Normocephalic and atraumatic.  Right Ear: External ear normal.  Left Ear: External ear normal.  Nose: Nose normal.  Mouth/Throat: Oropharynx is clear and moist.  Eyes: Conjunctivae and EOM are normal. Pupils are equal, round, and reactive to light.  Neck: Normal range of motion. Neck supple.  Cardiovascular: Normal rate, regular rhythm, normal heart sounds and intact distal pulses.    Pulmonary/Chest: Effort normal and breath sounds normal.  Abdominal: Soft. Bowel sounds are normal.  Musculoskeletal: Normal range of motion.  Some tenderness palpation right calf and right medial thigh no swelling or erythema noted  Neurological: She is alert and oriented to person, place, and time. She has normal reflexes.  Skin: Skin is warm and dry.  Psychiatric: She has a normal mood and affect. Her behavior is normal. Judgment and thought content normal.  Nursing note and vitals reviewed.   ED Course  Procedures (including critical care time) Labs Review Labs Reviewed  PROTIME-INR - Abnormal; Notable for the following:    Prothrombin Time 22.8 (*)    INR 1.99 (*)    All other components within normal limits  CBC WITH DIFFERENTIAL/PLATELET - Abnormal; Notable for the following:    Hemoglobin 11.3 (*)    HCT 34.8 (*)    Eosinophils Relative 11 (*)    Eosinophils Absolute 1.1 (*)    All other components within normal limits  COMPREHENSIVE METABOLIC PANEL - Abnormal; Notable for the following:    Glucose, Bld 118 (*)    BUN 30 (*)    Creatinine, Ser 1.51 (*)    Total Bilirubin 0.2 (*)    GFR calc non Af Amer 30 (*)    GFR calc Af Amer 35 (*)    All other components within normal limits  D-DIMER, QUANTITATIVE - Abnormal; Notable for the following:    D-Dimer, Quant 3.47 (*)    All other components within normal limits  URINALYSIS, ROUTINE W REFLEX MICROSCOPIC - Abnormal; Notable for the following:    Color, Urine STRAW (*)    APPearance HAZY (*)    Hgb urine dipstick TRACE (*)    Protein, ur 100 (*)    All other components within normal limits  URINE MICROSCOPIC-ADD ON - Abnormal; Notable for the following:    Squamous Epithelial / LPF MANY (*)    Bacteria, UA MANY (*)    All other components within normal limits  CULTURE, BLOOD (ROUTINE X 2)  CULTURE, BLOOD (ROUTINE X 2)  URINE CULTURE  TROPONIN I  I-STAT TROPOININ, ED    Imaging Review No results found.    EKG Interpretation   Date/Time:  Thursday Aug 30 2014 18:15:38 EDT Ventricular Rate:  60 PR Interval:  189 QRS Duration: 102 QT Interval:  438 QTC Calculation: 438 R Axis:   -2 Text Interpretation:  Sinus rhythm Anteroseptal infarct, age indeterminate  rhythm no longer paced since last tracing Confirmed by Favour Aleshire MD, Andee Poles  [phone]) on 08/30/2014 6:40:49 PM      MDM   Final diagnoses:  Dyspnea    Patient with sudden onset of dyspnea. Here she is hypertensive but not tachycardic and otherwise has been hemodynamically stable. Workup for pulmonary embolism revealed an elevated d-dimer CT angiogram does not show any evidence of pulmonary embolism. Patient is anticoagulated although it is subtherapeutic. Chest x-Pavle Wiler and CT chest revealed no evidence of acute infiltrate, pneumothorax, or other cause of her dyspnea. She has been hypertensive. Plan to give her her night time blood pressure medicines. She has felt fine here but has been option. It has been discontinued as of 9:30 and we will reassess evaluate her at 10:00 to see her she is feeling.she is having a repeat troponin checked. Her initial EKG was nonischemic and her first troponin was normal. Patient ambulated with oxygen saturations 95-96%. I discussed her need for follow-up regarding her Coumadin dosage and her blood pressure. Discussed comfort level with being discharged and need for return if any change in symptoms and she and her daughter-in-law voiced understanding.  Pattricia Boss, MD 08/30/14 2206

## 2014-08-31 NOTE — ED Notes (Signed)
Pt called and reports Dr. Jeanell Sparrow did not give her a prescription for antibiotic for uti.  Dr. Rogene Houston reviewed chart and prescribed keflex 500mg  po qid x 7 days, dispense 28.  Called prescription in to [facility] as requested by pt.

## 2014-09-02 LAB — URINE CULTURE: Special Requests: NORMAL

## 2014-09-03 ENCOUNTER — Telehealth (HOSPITAL_COMMUNITY): Payer: Self-pay

## 2014-09-03 NOTE — Telephone Encounter (Signed)
Post ED Visit - Positive Culture Follow-up  Culture report reviewed by antimicrobial stewardship pharmacist: []  Wes Dulaney, Pharm.D., BCPS []  Heide Guile, Pharm.D., BCPS []  Alycia Rossetti, Pharm.D., BCPS []  Hyrum, Pharm.D., BCPS, AAHIVP []  Legrand Como, Pharm.D., BCPS, AAHIVP []  Isac Sarna, Pharm.D., BCPS X  Tegan Magsam, Pharm D  Positive Urine culture, >/= 100,000 colonies -> Klebsiella Pneumoniae Treated with Keflex, organism sensitive to the same and no further patient follow-up is required at this time.  Dortha Kern 09/03/2014, 7:53 PM

## 2014-09-04 LAB — CULTURE, BLOOD (ROUTINE X 2)
Culture: NO GROWTH
Culture: NO GROWTH

## 2014-09-07 ENCOUNTER — Encounter: Payer: Self-pay | Admitting: Gynecologic Oncology

## 2014-09-07 ENCOUNTER — Ambulatory Visit: Payer: Medicare Other | Attending: Gynecologic Oncology | Admitting: Gynecologic Oncology

## 2014-09-07 VITALS — BP 188/80 | HR 60 | Temp 97.5°F | Resp 18 | Ht 63.0 in | Wt 192.0 lb

## 2014-09-07 DIAGNOSIS — C541 Malignant neoplasm of endometrium: Secondary | ICD-10-CM | POA: Diagnosis not present

## 2014-09-07 DIAGNOSIS — Z7189 Other specified counseling: Secondary | ICD-10-CM

## 2014-09-07 DIAGNOSIS — Z90710 Acquired absence of both cervix and uterus: Secondary | ICD-10-CM

## 2014-09-07 NOTE — Patient Instructions (Signed)
Plan to see Dr. Sondra Come to discuss possible radiation therapy. If you proceed with radiation, you will not need to see Dr. Denman George until after your radiation treatments are complete. If you choose to not do radiation, please followup in 3 months with Dr. Denman George.

## 2014-09-07 NOTE — Progress Notes (Signed)
POSTOPERATIVE EVALUATION  HPI:  Summer Wong is a 81 y.o. year old initially seen in consultation on 07/11/14, referred by Dr Radene Knee for grade III endometrial cancer.  She then underwent a robotic hysterectomy, BSO, pelvic and PA node dissection  on 623/76 without complications.  Her postoperative course was uncomplicated.  Her final pathologic diagnosis is a Stage II, Grade 3 endometrioid endometrial cancer with no lymphovascular space invasion, 8/10 mm (80%) of myometrial invasion and negative lymph nodes. The cervical stroma was involved.  She is seen today for a postoperative check and to discuss her pathology results and treatment plan.  Since discharge from the hospital, she is feeling well.  She has improving appetite, normal bowel and bladder function, and pain controlled with minimal PO medication. She has no other complaints today.  She had been seen in the ED 1 week ago for symptoms of flushing and weakness. Full workup was unremarkable with the exception of a UTI for which she has been taking antibiotics. Her symptoms have improved.  Review of systems: Constitutional:  She has no weight gain or weight loss. She has no fever or chills. Eyes: No blurred vision Ears, Nose, Mouth, Throat: No dizziness, headaches or changes in hearing. No mouth sores. Cardiovascular: No chest pain, palpitations or edema. Respiratory:  No shortness of breath, wheezing or cough Gastrointestinal: She has normal bowel movements without diarrhea or constipation. She denies any nausea or vomiting. She denies blood in her stool or heart burn. Genitourinary:  She denies pelvic pain, pelvic pressure or changes in her urinary function. She has no hematuria, dysuria, or incontinence. She has no irregular vaginal bleeding or vaginal discharge Musculoskeletal: Denies muscle weakness or joint pains.  Skin:  She has no skin changes, rashes or itching Neurological:  Denies dizziness or headaches. No neuropathy, no numbness  or tingling. Psychiatric:  She denies depression or anxiety. Hematologic/Lymphatic:   No easy bruising or bleeding   Physical Exam: Blood pressure 188/80, pulse 60, temperature 97.5 F (36.4 C), temperature source Oral, resp. rate 18, height 5\' 3"  (1.6 m), weight 192 lb (87.091 kg), SpO2 100 %. General: Well dressed, well nourished in no apparent distress.   HEENT:  Normocephalic and atraumatic, no lesions.  Extraocular muscles intact. Sclerae anicteric. Pupils equal, round, reactive. No mouth sores or ulcers. Thyroid is normal size, not nodular, midline. Skin:  No lesions or rashes. Breasts:  Soft, symmetric.  No skin or nipple changes.  No palpable LN or masses. Lungs:  Clear to auscultation bilaterally.  No wheezes. Cardiovascular:  Regular rate and rhythm.  No murmurs or rubs. Abdomen:  Soft, nontender, nondistended.  No palpable masses.  No hepatosplenomegaly.  No ascites. Normal bowel sounds.  No hernias.  Incisions are well healed Genitourinary: Normal EGBUS  Vaginal cuff intact.  No bleeding or discharge.  No cul de sac fullness. Extremities: No cyanosis, clubbing or edema.  No calf tenderness or erythema. No palpable cords. Psychiatric: Mood and affect are appropriate. Neurological: Awake, alert and oriented x 3. Sensation is intact, no neuropathy.  Musculoskeletal: No pain, normal strength and range of motion.  Assessment:    81 y.o. year old with Stage II Grade 3 endometrioid endometrial cancer.   S/p robotic hyst, bso, staging on 08/16/14. No LVSI, 80% myometrial invasion, positive cervical stroma, negative pelvic washings and negative lymph nodes.   Plan: 1) Pathology reports reviewed today 2) Treatment counseling - I spent approximately 25 minutes in face-to-face counseling with the patient and her daughter. We  discussed the underlying findings of stage II cancer. I discussed that this cancer carries with it a high-risk for local recurrence and that according to the ASTRO  guidelines for adjuvant radiation it is our recommendation to offer adjuvant whole pelvic radiation and vaginal brachytherapy to reduce risk of local recurrence. I did discuss that a reasonable alternative might be consideration of vaginal break he therapy which is generally better tolerated and less toxic, however would offer only whispered prevention of vaginal recurrence and not pelvic recurrence risk prevention. I discussed that chemotherapy is not indicated in the adjuvant therapy of this disease. I discussed that avoidance of recurrence could not be guaranteed even with adjuvant radiation. Discussed that treatment of her current cancer with salvage therapy is generally less successful in prevention with adjuvant therapy.  I recommending she see my colleague Dr. Gery Pray in radiation oncology to further discuss this.  3)  Return to clinic in 3 months if she elects for no therapy, or after completing radiation if she elects to do so.  Donaciano Eva, MD

## 2014-09-10 ENCOUNTER — Telehealth: Payer: Self-pay | Admitting: *Deleted

## 2014-09-10 NOTE — Telephone Encounter (Signed)
Called patient with appts for Dr. Sondra Come on 09/26/14 at 9:30am at [facility]. Told patient to arrive at 9:15am to register. Offered to call patient's daughter to review appt - she states she will call her daughter and give [place] a call back if she needs to reschedule for any reason.

## 2014-09-19 ENCOUNTER — Ambulatory Visit: Payer: Medicare Other | Admitting: Gynecologic Oncology

## 2014-09-26 ENCOUNTER — Ambulatory Visit: Payer: Medicare Other | Admitting: Radiation Oncology

## 2014-09-26 ENCOUNTER — Ambulatory Visit: Payer: Medicare Other

## 2014-09-26 NOTE — Progress Notes (Signed)
GYN Location of Tumor / Histology:Stage II, Grade 3 endometrioid endometrial cancer with no lymphovascular space invasion   Summer Wong presented with postmenopausal bleeding within the past year.   Biopsies revealed:   08/16/14 Diagnosis 1. Lymph node, biopsy, right para - aortic - ONE BENIGN LYMPH NODE (0/1). 2. Lymph nodes, regional resection, left para aortic - THREE BENIGN LYMPH NODES (0/3). 3. Uterus +/- tubes/ovaries, neoplastic - ENDOMETRIAL ADENOCARCINOMA INVADING THE OUTER HALF OF THE MYOMETRIUM AND UTERINE CERVIX. - MARGINS NOT INVOLVED. - BILATERAL OVARIES AND FALLOPIAN TUBES FREE OF TUMOR. - SEE ONCOLOGY TEMPLATE BELOW. 4. Lymph nodes, regional resection, left pelvic nodes - THREE BENIGN LYMPH NODES (0/3) 5. Lymph nodes, regional resection, right pelvic nodes - FIVE BENIGN LYMPH NODES (0/5).  Past/Anticipated interventions by Gyn/Onc surgery, if [id number]:  08/16/14 - ROBOTIC ASSISTED TOTAL HYSTERECTOMY WITH BILATERAL SALPINGO OOPHORECTOMY AND LYMPHADENECTOMY;  Surgeon: Everitt Amber, MD;  Location: [facility];  Service: Gynecology;  Laterality: Bilateral;   Past/Anticipated interventions by medical oncology, if any:   Weight changes, if any:   Bowel/Bladder complaints, if any: ,   Nausea/Vomiting, if any:   Pain issues, if any:    SAFETY ISSUES:  Prior radiation?   Pacemaker/ICD?   Possible current pregnancy?   Is the patient on methotrexate?   Current Complaints / other details:

## 2014-10-03 ENCOUNTER — Ambulatory Visit: Payer: Medicare Other

## 2014-10-03 ENCOUNTER — Ambulatory Visit
Admission: RE | Admit: 2014-10-03 | Discharge: 2014-10-03 | Disposition: A | Discharge status: Home / Self Care | Payer: Medicare Other | Source: Ambulatory Visit | Attending: Radiation Oncology | Admitting: Radiation Oncology

## 2014-10-03 DIAGNOSIS — I4891 Unspecified atrial fibrillation: Secondary | ICD-10-CM | POA: Insufficient documentation

## 2014-10-03 DIAGNOSIS — I10 Essential (primary) hypertension: Secondary | ICD-10-CM | POA: Insufficient documentation

## 2014-10-03 DIAGNOSIS — Z950 Presence of cardiac pacemaker: Secondary | ICD-10-CM | POA: Insufficient documentation

## 2014-10-03 DIAGNOSIS — Z794 Long term (current) use of insulin: Secondary | ICD-10-CM | POA: Insufficient documentation

## 2014-10-03 DIAGNOSIS — C541 Malignant neoplasm of endometrium: Secondary | ICD-10-CM | POA: Insufficient documentation

## 2014-10-03 DIAGNOSIS — Z9049 Acquired absence of other specified parts of digestive tract: Secondary | ICD-10-CM | POA: Insufficient documentation

## 2014-10-03 DIAGNOSIS — E119 Type 2 diabetes mellitus without complications: Secondary | ICD-10-CM | POA: Insufficient documentation

## 2014-10-03 DIAGNOSIS — Z882 Allergy status to sulfonamides status: Secondary | ICD-10-CM | POA: Insufficient documentation

## 2014-10-03 DIAGNOSIS — Z7901 Long term (current) use of anticoagulants: Secondary | ICD-10-CM | POA: Insufficient documentation

## 2014-10-03 DIAGNOSIS — E785 Hyperlipidemia, unspecified: Secondary | ICD-10-CM | POA: Insufficient documentation

## 2014-10-03 DIAGNOSIS — Z510 Encounter for antineoplastic radiation therapy: Secondary | ICD-10-CM | POA: Insufficient documentation

## 2014-10-03 DIAGNOSIS — Z90710 Acquired absence of both cervix and uterus: Secondary | ICD-10-CM | POA: Insufficient documentation

## 2014-10-03 DIAGNOSIS — Z9889 Other specified postprocedural states: Secondary | ICD-10-CM | POA: Insufficient documentation

## 2014-10-16 ENCOUNTER — Ambulatory Visit
Admission: RE | Admit: 2014-10-16 | Discharge: 2014-10-16 | Disposition: A | Discharge status: Home / Self Care | Payer: Medicare Other | Source: Ambulatory Visit | Attending: Radiation Oncology | Admitting: Radiation Oncology

## 2014-10-16 ENCOUNTER — Encounter: Payer: Self-pay | Admitting: Radiation Oncology

## 2014-10-16 VITALS — BP 180/64 | HR 59 | Temp 97.7°F | Resp 20 | Ht 63.0 in | Wt 195.9 lb

## 2014-10-16 DIAGNOSIS — Z9049 Acquired absence of other specified parts of digestive tract: Secondary | ICD-10-CM | POA: Diagnosis not present

## 2014-10-16 DIAGNOSIS — C541 Malignant neoplasm of endometrium: Secondary | ICD-10-CM | POA: Diagnosis present

## 2014-10-16 DIAGNOSIS — I4891 Unspecified atrial fibrillation: Secondary | ICD-10-CM | POA: Diagnosis not present

## 2014-10-16 DIAGNOSIS — Z90710 Acquired absence of both cervix and uterus: Secondary | ICD-10-CM | POA: Diagnosis not present

## 2014-10-16 DIAGNOSIS — E785 Hyperlipidemia, unspecified: Secondary | ICD-10-CM | POA: Diagnosis not present

## 2014-10-16 DIAGNOSIS — Z882 Allergy status to sulfonamides status: Secondary | ICD-10-CM | POA: Diagnosis not present

## 2014-10-16 DIAGNOSIS — I10 Essential (primary) hypertension: Secondary | ICD-10-CM | POA: Diagnosis not present

## 2014-10-16 DIAGNOSIS — Z510 Encounter for antineoplastic radiation therapy: Secondary | ICD-10-CM | POA: Diagnosis present

## 2014-10-16 DIAGNOSIS — Z9889 Other specified postprocedural states: Secondary | ICD-10-CM | POA: Diagnosis not present

## 2014-10-16 DIAGNOSIS — Z950 Presence of cardiac pacemaker: Secondary | ICD-10-CM | POA: Diagnosis not present

## 2014-10-16 DIAGNOSIS — Z7901 Long term (current) use of anticoagulants: Secondary | ICD-10-CM | POA: Diagnosis not present

## 2014-10-16 DIAGNOSIS — Z794 Long term (current) use of insulin: Secondary | ICD-10-CM | POA: Diagnosis not present

## 2014-10-16 DIAGNOSIS — E119 Type 2 diabetes mellitus without complications: Secondary | ICD-10-CM | POA: Diagnosis not present

## 2014-10-16 NOTE — Progress Notes (Signed)
Please see the Nurse Progress Note in the MD Initial Consult Encounter for this patient. 

## 2014-10-16 NOTE — Progress Notes (Signed)
Radiation Oncology         [phone]) [phone] ________________________________  Initial outpatient Consultation  Name: Summer Wong MRN: [id number]  Date: 10/16/2014  DOB: 1925-10-01  OQ:[id number],KPTW, PA-C  Everitt Amber, MD   REFERRING PHYSICIAN: Everitt Amber, MD  DIAGNOSIS: Stage II, Grade 3 endometrioid endometrial cancer with no lymphovascular space invasion, 8/10 mm (80%) of myometrial invasion and negative lymph nodes. The cervical stroma was involved.  HISTORY OF PRESENT ILLNESS::Summer Wong is a 81 y.o. female who is seen out courtesy of Dr. Denman George for an opinion concerning radiation therapy as part of management of patient's recently diagnosed endometrial cancer. The patient presented earlier this year with postmenopausal vaginal bleeding. She was seen by Dr Radene Knee and biopsy was performed which revealed grade 3 endometrial cancer. Patient was taken to the operating room on 08/16/2014 by Dr. Denman George at which time the patient underwent robotic hysterectomy, BSO, pelvic and periaortic node dissection. No complications during surgery however the patient presented to the emergency room several days after her surgery with severe fatigue and some shortness of breath. Patient is done well since that ER visit. Given the risk for recurrence with the patient's staging and grade of tumor, radiation therapy is been consulted for consideration for postoperative adjuvant treatment.Marland Kitchen  PREVIOUS RADIATION THERAPY: No  PAST MEDICAL HISTORY:  has a past medical history of Atrial fibrillation; Bradycardia; Hypertension; Hyperlipemia; Diabetes mellitus without complication; Sleep apnea; Presence of permanent cardiac pacemaker; Heart murmur; Shortness of breath dyspnea; Stress incontinence; GERD (gastroesophageal reflux disease); Arthritis; and Hepatitis.    PAST SURGICAL HISTORY: Past Surgical History  Procedure Laterality Date  . Pacemaker insertion    . Cholecystectomy    . Bladder suspension    . Tubal  ligation    . Robotic assisted total hysterectomy with bilateral salpingo oopherectomy Bilateral 08/16/2014    Procedure:  ROBOTIC ASSISTED TOTAL HYSTERECTOMY WITH BILATERAL SALPINGO OOPHORECTOMY AND LYMPHADENECTOMY;  Surgeon: Everitt Amber, MD;  Location: [facility];  Service: Gynecology;  Laterality: Bilateral;    FAMILY HISTORY: family history includes Colon cancer in her brother; Stomach cancer in her father.  SOCIAL HISTORY:  reports that she has never smoked. She has never used smokeless tobacco. She reports that she does not drink alcohol or use illicit drugs. she lives in the [place] area.  ALLERGIES: Sulfa antibiotics and Vasotec  MEDICATIONS:  Current Outpatient Prescriptions  Medication Sig Dispense Refill  . acetaminophen (TYLENOL) 500 MG tablet Take 1,000 mg by mouth every 6 (six) hours as needed for moderate pain or headache.    . albuterol (PROVENTIL HFA;VENTOLIN HFA) 108 (90 BASE) MCG/ACT inhaler Inhale 1-2 puffs into the lungs every 6 (six) hours as needed for wheezing or shortness of breath.    Marland Kitchen amLODipine (NORVASC) 10 MG tablet Take 10 mg by mouth every morning.     . carvedilol (COREG) 25 MG tablet Take 25 mg by mouth 2 (two) times daily with a meal.    . cetirizine (ZYRTEC) 10 MG tablet Take 10 mg by mouth every morning.     . cholecalciferol (VITAMIN D) 1000 UNITS tablet Take 1,000 Units by mouth 2 (two) times daily.    Marland Kitchen docusate sodium (COLACE) 100 MG capsule Take 1 capsule (100 mg total) by mouth 2 (two) times daily. 30 capsule 0  . fenofibrate (TRICOR) 145 MG tablet Take 145 mg by mouth every morning.    . ferrous sulfate 325 (65 FE) MG tablet Take 325 mg by mouth daily with breakfast.    .  insulin glargine (LANTUS) 100 UNIT/ML injection Inject 43 Units into the skin at bedtime.    Marland Kitchen losartan (COZAAR) 100 MG tablet Take 100 mg by mouth every morning.     . Multiple Vitamins-Minerals (PRESERVISION AREDS) CAPS Take 1 capsule by mouth 2 (two) times daily.    .  nitroGLYCERIN (NITROSTAT) 0.4 MG SL tablet Place 0.4 mg under the tongue every 5 (five) minutes as needed for chest pain.    . Omega-3 Fatty Acids (FISH OIL) 1000 MG CAPS Take 1,000 mg by mouth 2 (two) times daily.    Marland Kitchen omeprazole (PRILOSEC) 20 MG capsule Take 20 mg by mouth every morning.     . traMADol (ULTRAM) 50 MG tablet Take 1 tablet (50 mg total) by mouth every 6 (six) hours as needed for moderate pain. 30 tablet 0  . warfarin (COUMADIN) 3 MG tablet Take 3 mg by mouth See admin instructions. 3mg  all days but Sunday. **Do not take on Sundays*     No current facility-administered medications for this encounter.    REVIEW OF SYSTEMS:  A 15 point review of systems is documented in the electronic medical record. This was obtained by the nursing staff. However, I reviewed this with the patient to discuss relevant findings and make appropriate changes.    Review of systems: Constitutional: She has no weight gain or weight loss. She has no fever or chills. Fatigue as above after her surgery Eyes: No blurred vision Ears, Nose, Mouth, Throat: No dizziness, headaches or changes in hearing. No mouth sores. Cardiovascular: No chest pain, palpitations or edema. Respiratory: No shortness of breath, wheezing or cough Gastrointestinal: She has normal bowel movements without diarrhea or constipation. She denies any nausea or vomiting. She denies blood in her stool or heart burn. Genitourinary: She denies pelvic pain, pelvic pressure or changes in her urinary function. She has no hematuria, dysuria.  She has no irregular vaginal bleeding or vaginal discharge. She does report urinary incontinence which she had prior to her surgery. She wears a depends for this issue. Musculoskeletal: Denies muscle weakness or joint pains.  Skin: She has no skin changes, rashes or itching Neurological: Denies dizziness or headaches. No neuropathy, no numbness or tingling. Psychiatric: She denies depression or  anxiety. Hematologic/Lymphatic: No easy bruising or bleeding  PHYSICAL EXAM:  height is 5\' 3"  (1.6 m) and weight is 195 lb 14.4 oz (88.86 kg). Her oral temperature is 97.7 F (36.5 C). Her blood pressure is 180/64 and her pulse is 59. Her respiration is 20 and oxygen saturation is 98%.   BP 180/64 mmHg  Pulse 59  Temp(Src) 97.7 F (36.5 C) (Oral)  Resp 20  Ht 5\' 3"  (1.6 m)  Wt 195 lb 14.4 oz (88.86 kg)  BMI 34.71 kg/m2  SpO2 98%  General Appearance:    Alert, cooperative, no distress, appears stated age, accompanied by daughter-in-law on evaluation today   Head:    Normocephalic, without obvious abnormality, atraumatic  Eyes:    PERRL, conjunctiva/corneas clear, EOM's intact,      Ears:    Normal TM's and external ear canals, both ears  Nose:   Nares normal, septum midline, mucosa normal, no drainage    or sinus tenderness  Throat:   Lips, mucosa, and tongue normal;  gums normal  Neck:   Supple, symmetrical, trachea midline, no adenopathy;    thyroid:  no enlargement/tenderness/nodules; no carotid   bruit or JVD  Back:     Symmetric, no curvature, ROM normal,  no CVA tenderness  Lungs:     Clear to auscultation bilaterally, respirations unlabored  Chest Wall:    No tenderness or deformity   Heart:    Regular rate and rhythm, S1 and S2 normal, no murmur, rub   or gallop  Breast Exam:    Not performed  Abdomen:     Soft, non-tender, bowel sounds active all four quadrants,    no masses, no organomegaly, several small scars from laparoscopic procedure, no signs of drainage or infection   Genitalia:    erythema to the perineum consistent with urinary irritation. No obvious signs of yeast infection. No no lesions noted along the external genitalia. On speculum examination no mucosal lesions are noted in the vaginal vault. On bimanual examination the vaginal cuff was intact. No palpable pelvic masses   Rectal:   deferred  Extremities:   Extremities normal, atraumatic, no cyanosis or edema   Pulses:   2+ and symmetric all extremities  Skin:   Skin color, texture, turgor normal, no rashes or lesions  Lymph nodes:   Cervical, supraclavicular, and axillary nodes normal  Neurologic:   normal strength, sensation and reflexes    throughout     ECOG = 1    1 - Symptomatic but completely ambulatory (Restricted in physically strenuous activity but ambulatory and able to carry out work of a light or sedentary nature. For example, light housework, office work)  LABORATORY DATA:  Lab Results  Component Value Date   WBC 9.7 08/30/2014   HGB 11.3* 08/30/2014   HCT 34.8* 08/30/2014   MCV 86.8 08/30/2014   PLT 342 08/30/2014   NEUTROABS 5.4 08/30/2014   Lab Results  Component Value Date   NA 142 08/30/2014   K 3.8 08/30/2014   CL 108 08/30/2014   CO2 26 08/30/2014   GLUCOSE 118* 08/30/2014   CREATININE 1.51* 08/30/2014   CALCIUM 8.9 08/30/2014      RADIOGRAPHY: No results found.    Impression: Stage II, Grade 3 endometrioid endometrial cancer with no lymphovascular space invasion, 8/10 mm (80%) of myometrial invasion and negative lymph nodes. The cervical stroma was involved. Given the above pathologic findings the patient will be at risk for pelvic and vaginal cuff recurrence. I discussed with the patient and daughter-in-law recommendations with this stage would be to consider external beam radiation therapy directed at the pelvis and intracavitary brachytherapy treatments.  Given the patient's advanced age external beam radiation treatments would be difficult for her. She is currently her primary caregiver and does her housekeeping and shopping as well as driving. I discussed with the patient and family that she may require some assistance due to fatigue from external beam treatment. I also discussed with patient that if she did not wish to proceed with external beam treatments that vaginal cuff brachytherapy would be tolerated better with less chance for complications and  would address one of the most likely sites for potential recurrence. The third option for the patient would be to consider no adjuvant treatment at this time but close follow-up with Dr. Denman George.  PLAN:  Patient would like to discussed her options for management with her family prior to making a final decision concerning her management. I've asked patient to call me once she has decided upon whether she will proceed with postoperative radiation treatments.  ------------------------------------------------  Blair Promise, PhD, MD

## 2014-10-16 NOTE — Progress Notes (Signed)
GYN Location of Tumor / Histology:Stage II, Grade 3 endometrioid cancer with no lymphovascular space invasion   Summer Wong presented with postmenopausal bleeding within the past year.   Biopsies revealed:   08/16/14 Diagnosis 1. Lymph node, biopsy, right para - aortic - ONE BENIGN LYMPH NODE (0/1). 2. Lymph nodes, regional resection, left para aortic - THREE BENIGN LYMPH NODES (0/3). 3. Uterus +/- tubes/ovaries, neoplastic - ENDOMETRIAL ADENOCARCINOMA INVADING THE OUTER HALF OF THE MYOMETRIUM AND UTERINE CERVIX. - MARGINS NOT INVOLVED. - BILATERAL OVARIES AND FALLOPIAN TUBES FREE OF TUMOR. - SEE ONCOLOGY TEMPLATE BELOW. 4. Lymph nodes, regional resection, left pelvic nodes - THREE BENIGN LYMPH NODES (0/3) 5. Lymph nodes, regional resection, right pelvic nodes - FIVE BENIGN LYMPH NODES (0/5).  Past/Anticipated interventions by Gyn/Onc surgery, if [id number]:  08/16/14 - ROBOTIC ASSISTED TOTAL HYSTERECTOMY WITH BILATERAL SALPINGO OOPHORECTOMY AND LYMPHADENECTOMY;  Surgeon: Everitt Amber, MD;  Location: [facility];  Service: Gynecology;  Laterality: Bilateral;   Past/Anticipated interventions by medical oncology, if any: no  Weight changes, if any: no  Bowel/Bladder complaints, if any: reports urinary incontinence since surgery - had a bladder sling, denies dysuria, hematuria, denies bowel issues.  Nausea/Vomiting, if any: no  Pain issues, if any:  no  SAFETY ISSUES:  Prior radiation? no  Pacemaker/ICD? yes  Possible current pregnancy? no  Is the patient on methotrexate? no  Current Complaints / other details:  Patient is here with her daughter in law.  She has 3 children.    BP 203/64 mmHg  Pulse 59  Temp(Src) 97.7 F (36.5 C) (Oral)  Resp 20  Ht 5\' 3"  (1.6 m)  Wt 195 lb 14.4 oz (88.86 kg)  BMI 34.71 kg/m2  SpO2 98%

## 2014-10-18 ENCOUNTER — Telehealth: Payer: Self-pay | Admitting: Oncology

## 2014-10-18 NOTE — Telephone Encounter (Signed)
Summer Wong called and asked if radiation would effect her immune system.  She also asked what her life span would be with or without radiation.  Advised her that Dr. Sondra Come will be notified of her questions and we will call her back.

## 2014-10-18 NOTE — Telephone Encounter (Signed)
Called Summer Wong back and informed her that her immune system will not be affected with radiation (brachytherapy).  Also advised her that Dr. Sondra Come said that there was not a difference in life expectancy with or without radiation.  He said the radiation would help her avoid a recurrence of the cancer.  [name] verbalized understanding.

## 2014-12-10 ENCOUNTER — Other Ambulatory Visit: Payer: Medicare Other

## 2014-12-10 ENCOUNTER — Ambulatory Visit: Payer: Medicare Other | Attending: Gynecologic Oncology | Admitting: Gynecologic Oncology

## 2014-12-10 ENCOUNTER — Encounter: Payer: Self-pay | Admitting: Gynecologic Oncology

## 2014-12-10 VITALS — BP 162/58 | HR 60 | Temp 98.1°F | Resp 19 | Ht 63.0 in | Wt 193.4 lb

## 2014-12-10 DIAGNOSIS — C541 Malignant neoplasm of endometrium: Secondary | ICD-10-CM | POA: Diagnosis present

## 2014-12-10 DIAGNOSIS — I4891 Unspecified atrial fibrillation: Secondary | ICD-10-CM | POA: Diagnosis not present

## 2014-12-10 DIAGNOSIS — R600 Localized edema: Secondary | ICD-10-CM

## 2014-12-10 DIAGNOSIS — Z7901 Long term (current) use of anticoagulants: Secondary | ICD-10-CM | POA: Diagnosis not present

## 2014-12-10 NOTE — Progress Notes (Signed)
GYN ONC FOLLOW UP EVALUATION  Assessment:    81 y.o. year old with Stage II Grade 3 endometrioid endometrial cancer.   S/p robotic hyst, bso, staging on 08/16/14. No LVSI, 80% myometrial invasion, positive cervical stroma, negative pelvic washings and negative lymph nodes. Declined adjuvant radiation. New onset LE edema (Right > Left).   Plan: 1) Followup results of cuff biopsy - if malignant, would strongly recommend radiation (after restaging imaging). 2) LE edema: concern for DVT vs lymphedema vs malignant pelvic lymph obstruction. Dopplers of lower extremity to rule out DVT CT of abdo and pelvis to rule out recurrence in pelvis causing new onset lymphadema Referral to PT for compression stocking measurement and therapy for lymphedema Recommended discontinuing the knee high compression stockings as these are acting as a torniquet.  HPI:  Summer Wong is a 81 y.o. year old initially seen in consultation on 07/11/14, referred by Dr Radene Knee for grade III endometrial cancer.  She then underwent a robotic hysterectomy, BSO, pelvic and PA node dissection  on 354/65 without complications.  Her postoperative course was uncomplicated.  Her final pathologic diagnosis is a Stage II, Grade 3 endometrioid endometrial cancer with no lymphovascular space invasion, 8/10 mm (80%) of myometrial invasion and negative lymph nodes. The cervical stroma was involved.  She was recommended to receive adjuvant vaginal brachytherapy and whole pelvic RT to reduce the risk of local recurrence. She declined this after receiving consultation with Dr Sondra Come.  Interval Hx: She developed symptoms of LE edema (Rt > Lt) in the past month. She is on coumadin for afib. Her PCP recommended compression garments (knee length) which she purchased today, however after applying them they cut into her upper calf. She denies vaginal bleeding or pelvic pain. She denies change in bladder or bowel habit.  Current Outpatient Prescriptions on  File Prior to Visit  Medication Sig Dispense Refill  . acetaminophen (TYLENOL) 500 MG tablet Take 1,000 mg by mouth every 6 (six) hours as needed for moderate pain or headache.    . albuterol (PROVENTIL HFA;VENTOLIN HFA) 108 (90 BASE) MCG/ACT inhaler Inhale 1-2 puffs into the lungs every 6 (six) hours as needed for wheezing or shortness of breath.    Marland Kitchen amLODipine (NORVASC) 10 MG tablet Take 10 mg by mouth every morning.     . carvedilol (COREG) 25 MG tablet Take 25 mg by mouth 2 (two) times daily with a meal.    . cetirizine (ZYRTEC) 10 MG tablet Take 10 mg by mouth every morning.     . cholecalciferol (VITAMIN D) 1000 UNITS tablet Take 1,000 Units by mouth 2 (two) times daily.    Marland Kitchen docusate sodium (COLACE) 100 MG capsule Take 1 capsule (100 mg total) by mouth 2 (two) times daily. 30 capsule 0  . fenofibrate (TRICOR) 145 MG tablet Take 145 mg by mouth every morning.    . ferrous sulfate 325 (65 FE) MG tablet Take 325 mg by mouth daily with breakfast.    . insulin glargine (LANTUS) 100 UNIT/ML injection Inject 43 Units into the skin at bedtime.    Marland Kitchen losartan (COZAAR) 100 MG tablet Take 100 mg by mouth every morning.     . Multiple Vitamins-Minerals (PRESERVISION AREDS) CAPS Take 1 capsule by mouth 2 (two) times daily.    . Omega-3 Fatty Acids (FISH OIL) 1000 MG CAPS Take 1,000 mg by mouth 2 (two) times daily.    Marland Kitchen omeprazole (PRILOSEC) 20 MG capsule Take 20 mg by mouth every morning.     Marland Kitchen  traMADol (ULTRAM) 50 MG tablet Take 1 tablet (50 mg total) by mouth every 6 (six) hours as needed for moderate pain. 30 tablet 0  . warfarin (COUMADIN) 3 MG tablet Take 3 mg by mouth See admin instructions. 3mg  all days but Sunday. **Do not take on Sundays*    . nitroGLYCERIN (NITROSTAT) 0.4 MG SL tablet Place 0.4 mg under the tongue every 5 (five) minutes as needed for chest pain.     No current facility-administered medications on file prior to visit.   Allergies  Allergen Reactions  . Sulfa Antibiotics  Other (See Comments)    Does not remember.   Michail Sermon [Enalaprilat] Swelling    Tongue.     Past Medical History  Diagnosis Date  . Atrial fibrillation   . Bradycardia   . Hypertension   . Hyperlipemia   . Diabetes mellitus without complication   . Sleep apnea     cpap - patient does not know settings   . Presence of permanent cardiac pacemaker   . Heart murmur   . Shortness of breath dyspnea     with exertion   . Stress incontinence   . GERD (gastroesophageal reflux disease)   . Arthritis   . Hepatitis     hx of Hep A - years ago     Past Surgical History  Procedure Laterality Date  . Pacemaker insertion    . Cholecystectomy    . Bladder suspension    . Tubal ligation    . Robotic assisted total hysterectomy with bilateral salpingo oopherectomy Bilateral 08/16/2014    Procedure:  ROBOTIC ASSISTED TOTAL HYSTERECTOMY WITH BILATERAL SALPINGO OOPHORECTOMY AND LYMPHADENECTOMY;  Surgeon: Everitt Amber, MD;  Location: [facility];  Service: Gynecology;  Laterality: Bilateral;    Social History   Social History  . Marital Status: Widowed    Spouse Name: N/A  . Number of Children: 2  . Years of Education: N/A   Occupational History  . Not on file.   Social History Main Topics  . Smoking status: Never Smoker   . Smokeless tobacco: Never Used  . Alcohol Use: No  . Drug Use: No  . Sexual Activity: Not on file   Other Topics Concern  . Not on file   Social History Narrative   Family History  Problem Relation Age of Onset  . Stomach cancer Father   . Colon cancer Brother      Review of systems: Constitutional:  She has no weight gain or weight loss. She has no fever or chills. Eyes: No blurred vision Ears, Nose, Mouth, Throat: No dizziness, headaches or changes in hearing. No mouth sores. Cardiovascular: No chest pain, palpitations or edema. Respiratory:  No shortness of breath, wheezing or cough Gastrointestinal: She has normal bowel movements without diarrhea or  constipation. She denies any nausea or vomiting. She denies blood in her stool or heart burn. Genitourinary:  She denies pelvic pain, pelvic pressure or changes in her urinary function. She has no hematuria, dysuria, or incontinence. She has no irregular vaginal bleeding or vaginal discharge Musculoskeletal: Denies muscle weakness or joint pains.  Skin:  She has no skin changes, rashes or itching Neurological:  Denies dizziness or headaches. No neuropathy, no numbness or tingling. Psychiatric:  She denies depression or anxiety. Hematologic/Lymphatic:   No easy bruising or bleeding. + LE edema   Physical Exam: Blood pressure 162/58, pulse 60, temperature 98.1 F (36.7 C), temperature source Oral, resp. rate 19, height 5\' 3"  (1.6 m),  weight 193 lb 6.4 oz (87.726 kg), SpO2 97 %. General: Well dressed, well nourished in no apparent distress.   HEENT:  Normocephalic and atraumatic, no lesions.  Extraocular muscles intact. Sclerae anicteric. Pupils equal, round, reactive. No mouth sores or ulcers. Thyroid is normal size, not nodular, midline. Skin:  No lesions or rashes. Breasts: deferred Lungs:  Clear to auscultation bilaterally.  No wheezes. Cardiovascular:  Regular rate and rhythm.  No murmurs or rubs. Abdomen:  Soft, nontender, nondistended.  No palpable masses.  No hepatosplenomegaly.  No ascites. Normal bowel sounds.  No hernias.  Incisions are well healed Genitourinary: Vaginal cuff in tact. Area of friable tissue (possible granulation tissue) at cuff. Biopsy performed (see below) Extremities: No cyanosis, clubbing. + 3+  Edema right LE and 1+ left LE.  No calf tenderness or erythema. No palpable cords. Psychiatric: Mood and affect are appropriate. Neurological: Awake, alert and oriented x 3. Sensation is intact, no neuropathy.  Musculoskeletal: No pain, normal strength and range of motion.  Procedure Note: vaginal cuff biopsy The patient provided verbal consent the procedure. A tissue a  forcep was used to biopsy the vaginal cuff. Hemostasis was achieved with pressure with a colpostat. The patient was stable at the completion of the procedure.  Donaciano Eva, MD

## 2014-12-10 NOTE — Patient Instructions (Addendum)
We will call you with the results of the biopsy Dr. Denman George took today.  We will arrange for a PT referral for you for lymphedema. You will receive a phone call with this appointment.   You are scheduled for bilateral leg dopplers tomorrow, 12/11/14, at [place] at [facility]. Please arrive at 8:45am.  Plan for CT scan on 12/14/14 at 11am at [facility].

## 2014-12-11 ENCOUNTER — Ambulatory Visit (HOSPITAL_COMMUNITY)
Admission: RE | Admit: 2014-12-11 | Discharge: 2014-12-11 | Disposition: A | Discharge status: Home / Self Care | Payer: Medicare Other | Source: Ambulatory Visit | Attending: Gynecologic Oncology | Admitting: Gynecologic Oncology

## 2014-12-11 ENCOUNTER — Encounter: Payer: Self-pay | Admitting: Nurse Practitioner

## 2014-12-11 ENCOUNTER — Other Ambulatory Visit: Payer: Medicare Other

## 2014-12-11 DIAGNOSIS — C541 Malignant neoplasm of endometrium: Secondary | ICD-10-CM | POA: Diagnosis not present

## 2014-12-11 DIAGNOSIS — M7121 Synovial cyst of popliteal space [Baker], right knee: Secondary | ICD-10-CM | POA: Diagnosis not present

## 2014-12-11 DIAGNOSIS — M7122 Synovial cyst of popliteal space [Baker], left knee: Secondary | ICD-10-CM | POA: Diagnosis not present

## 2014-12-11 DIAGNOSIS — R600 Localized edema: Secondary | ICD-10-CM | POA: Diagnosis present

## 2014-12-11 NOTE — Progress Notes (Signed)
*  PRELIMINARY RESULTS* Vascular Ultrasound Lower extremity venous duplex has been completed.  Preliminary findings: Negative for DVT. Bilateral baker's cyst noted.   Attempted call report to Dr. Denman George. Left voice message with results.   Landry Mellow, RDMS, RVT  12/11/2014, 9:22 AM

## 2014-12-11 NOTE — Progress Notes (Signed)
Per MD, RN faxed referral to lymphedema clinic to fax # [phone] with confirmation of receipt 12/11/14

## 2014-12-12 ENCOUNTER — Telehealth: Payer: Self-pay | Admitting: *Deleted

## 2014-12-12 NOTE — Telephone Encounter (Signed)
-----   Message from Everitt Amber, MD sent at 12/11/2014  4:16 PM EDT ----- Can we please let her know that her dopplers were normal (no DVT) and her biopsy was negative for cancer). Thanks Terrence Dupont

## 2014-12-12 NOTE — Telephone Encounter (Signed)
Called patient and gave her results as noted below by Dr. Denman George. Patient agreeable to keep appt on 8-19 for CT scan as scheduled. No additional questions or concerns noted at this time.

## 2014-12-14 ENCOUNTER — Ambulatory Visit (HOSPITAL_COMMUNITY)
Admission: RE | Admit: 2014-12-14 | Discharge: 2014-12-14 | Disposition: A | Discharge status: Home / Self Care | Payer: Medicare Other | Source: Ambulatory Visit | Attending: Gynecologic Oncology | Admitting: Gynecologic Oncology

## 2014-12-14 ENCOUNTER — Ambulatory Visit (HOSPITAL_COMMUNITY): Payer: Medicare Other

## 2014-12-14 ENCOUNTER — Encounter (HOSPITAL_COMMUNITY): Payer: Self-pay

## 2014-12-14 DIAGNOSIS — K5730 Diverticulosis of large intestine without perforation or abscess without bleeding: Secondary | ICD-10-CM | POA: Diagnosis not present

## 2014-12-14 DIAGNOSIS — I517 Cardiomegaly: Secondary | ICD-10-CM | POA: Insufficient documentation

## 2014-12-14 DIAGNOSIS — M8580 Other specified disorders of bone density and structure, unspecified site: Secondary | ICD-10-CM | POA: Insufficient documentation

## 2014-12-14 DIAGNOSIS — Z90710 Acquired absence of both cervix and uterus: Secondary | ICD-10-CM | POA: Insufficient documentation

## 2014-12-14 DIAGNOSIS — J8410 Pulmonary fibrosis, unspecified: Secondary | ICD-10-CM | POA: Diagnosis not present

## 2014-12-14 DIAGNOSIS — N281 Cyst of kidney, acquired: Secondary | ICD-10-CM | POA: Diagnosis not present

## 2014-12-14 DIAGNOSIS — M5136 Other intervertebral disc degeneration, lumbar region: Secondary | ICD-10-CM | POA: Insufficient documentation

## 2014-12-14 DIAGNOSIS — Z9049 Acquired absence of other specified parts of digestive tract: Secondary | ICD-10-CM | POA: Diagnosis not present

## 2014-12-14 DIAGNOSIS — C541 Malignant neoplasm of endometrium: Secondary | ICD-10-CM | POA: Diagnosis present

## 2014-12-14 DIAGNOSIS — R600 Localized edema: Secondary | ICD-10-CM | POA: Insufficient documentation

## 2014-12-14 DIAGNOSIS — I7090 Unspecified atherosclerosis: Secondary | ICD-10-CM | POA: Insufficient documentation

## 2014-12-14 MED ORDER — IOHEXOL 300 MG/ML  SOLN
50.0000 mL | Freq: Once | INTRAMUSCULAR | Status: AC | PRN
  Administered 2014-12-14: 50 mL via ORAL

## 2015-01-07 ENCOUNTER — Telehealth: Payer: Self-pay | Admitting: Gynecologic Oncology

## 2015-01-07 NOTE — Telephone Encounter (Signed)
Patient informed of CT scan and recommends to proceed with referral to lymphedema clinic.  Advised that the referral would be made and she would hear from their office with an appt.  She is advised to call for any questions or concerns.

## 2015-06-17 IMAGING — CT CT ANGIO CHEST
1 of 6 series · 5 of 36 positions shown · IV contrast (Omnipaque 300)
Comparison: CT chest 07/23/2014

CLINICAL DATA: Short of breath and weakness, progressive over
several days.

EXAM:
CT ANGIOGRAPHY CHEST WITH CONTRAST
TECHNIQUE: Multidetector CT imaging of the chest was performed using the
standard protocol during bolus administration of intravenous
contrast. Multiplanar CT image reconstructions and MIPs were
obtained to evaluate the vascular anatomy.
CONTRAST:  80mL OMNIPAQUE IOHEXOL 350 MG/ML SOLN

[Series 6: pe 3.0 b40f · axial · 0.59mm/px · z∈[-277,-97]mm · 5 of 90 slices shown]
[im 15/90  lung]
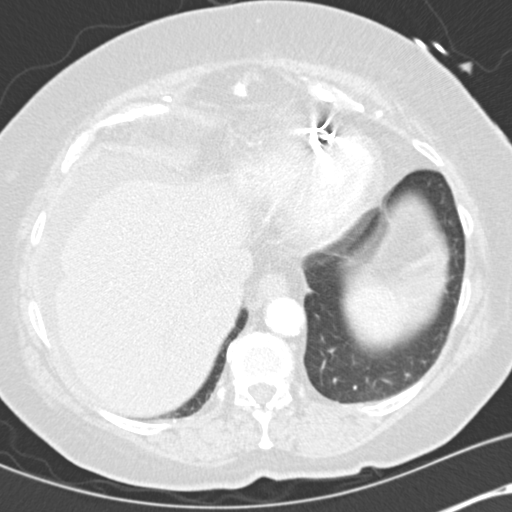
[im 30/90  mediastinal]
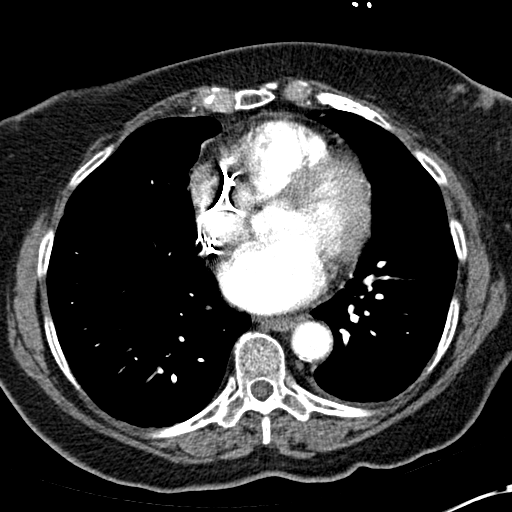
[im 45/90  lung]
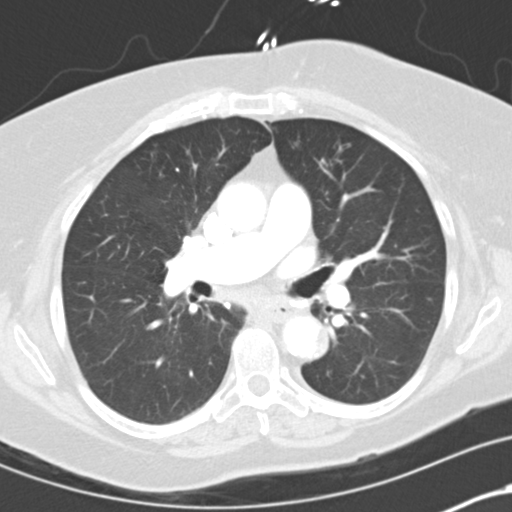
[im 60/90  mediastinal]
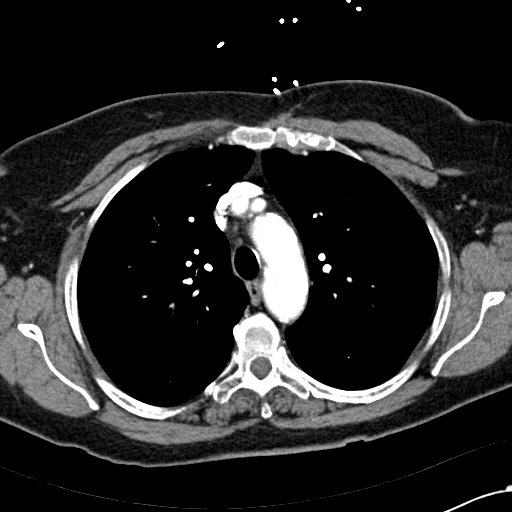
[im 75/90  lung]
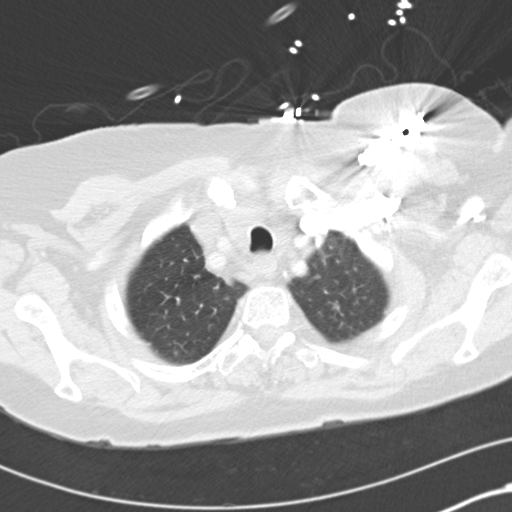

[5 of 36 positions shown; findings below may reference images not displayed]

FINDINGS: Mediastinum/Nodes: No filling defects within the pulmonary arteries
to suggest acute pulmonary embolism. No acute findings aorta great
vessels. No pericardial fluid. Esophagus is normal.

No mediastinal or hilar lymphadenopathy. No axillary supraclavicular
adenopathy.

Lungs/Pleura: No pleural fluid. No pneumothorax. Airways are normal.
Within the right upper lobe 5 mm nodule on image 19, series 8. This
nodule is not seen on comparison CT from [REDACTED]. Mild branching
nodularity in the left upper lobe is noted on image 34, series 8.

Upper abdomen: There is new fluid surrounding the right hepatic lobe
margin and the spleen. Adrenal glands and pancreas are not imaged.

Musculoskeletal: No aggressive osseous lesion.

Review of the MIP images confirms the above findings.
IMPRESSION: 1. No evidence acute pulmonary embolism.
2. New free fluid in the abdomen of unclear etiology.
3. New right upper lobe pulmonary nodule and mild branching
nodularity in the left upper lobe are likely infectious etiology.
Recommend follow-up CT in 3 months to evaluate for persistence of
the right upper lobe nodule.

## 2015-06-25 ENCOUNTER — Other Ambulatory Visit (HOSPITAL_COMMUNITY): Payer: Self-pay | Admitting: Physician Assistant

## 2015-06-25 DIAGNOSIS — Z1231 Encounter for screening mammogram for malignant neoplasm of breast: Secondary | ICD-10-CM

## 2015-06-28 ENCOUNTER — Ambulatory Visit (HOSPITAL_COMMUNITY): Payer: Medicare Other

## 2015-10-15 ENCOUNTER — Emergency Department (HOSPITAL_COMMUNITY): Payer: Medicare Other

## 2015-10-15 ENCOUNTER — Inpatient Hospital Stay (HOSPITAL_COMMUNITY)
Admission: EM | Admit: 2015-10-15 | Discharge: 2015-10-18 | DRG: 291 | Disposition: A | Discharge status: Home Health | Payer: Medicare Other | Attending: Internal Medicine | Admitting: Internal Medicine

## 2015-10-15 ENCOUNTER — Encounter (HOSPITAL_COMMUNITY): Payer: Self-pay | Admitting: *Deleted

## 2015-10-15 ENCOUNTER — Other Ambulatory Visit: Payer: Self-pay

## 2015-10-15 DIAGNOSIS — Z794 Long term (current) use of insulin: Secondary | ICD-10-CM

## 2015-10-15 DIAGNOSIS — R609 Edema, unspecified: Secondary | ICD-10-CM | POA: Diagnosis not present

## 2015-10-15 DIAGNOSIS — J441 Chronic obstructive pulmonary disease with (acute) exacerbation: Secondary | ICD-10-CM | POA: Diagnosis present

## 2015-10-15 DIAGNOSIS — I5033 Acute on chronic diastolic (congestive) heart failure: Secondary | ICD-10-CM | POA: Diagnosis present

## 2015-10-15 DIAGNOSIS — I509 Heart failure, unspecified: Secondary | ICD-10-CM | POA: Diagnosis not present

## 2015-10-15 DIAGNOSIS — N179 Acute kidney failure, unspecified: Secondary | ICD-10-CM | POA: Diagnosis present

## 2015-10-15 DIAGNOSIS — K219 Gastro-esophageal reflux disease without esophagitis: Secondary | ICD-10-CM | POA: Diagnosis present

## 2015-10-15 DIAGNOSIS — I252 Old myocardial infarction: Secondary | ICD-10-CM

## 2015-10-15 DIAGNOSIS — Z888 Allergy status to other drugs, medicaments and biological substances status: Secondary | ICD-10-CM

## 2015-10-15 DIAGNOSIS — E785 Hyperlipidemia, unspecified: Secondary | ICD-10-CM | POA: Diagnosis present

## 2015-10-15 DIAGNOSIS — D62 Acute posthemorrhagic anemia: Secondary | ICD-10-CM | POA: Diagnosis not present

## 2015-10-15 DIAGNOSIS — Z882 Allergy status to sulfonamides status: Secondary | ICD-10-CM | POA: Diagnosis not present

## 2015-10-15 DIAGNOSIS — I130 Hypertensive heart and chronic kidney disease with heart failure and stage 1 through stage 4 chronic kidney disease, or unspecified chronic kidney disease: Secondary | ICD-10-CM | POA: Diagnosis present

## 2015-10-15 DIAGNOSIS — Z79899 Other long term (current) drug therapy: Secondary | ICD-10-CM

## 2015-10-15 DIAGNOSIS — I480 Paroxysmal atrial fibrillation: Secondary | ICD-10-CM | POA: Diagnosis present

## 2015-10-15 DIAGNOSIS — E1122 Type 2 diabetes mellitus with diabetic chronic kidney disease: Secondary | ICD-10-CM | POA: Diagnosis present

## 2015-10-15 DIAGNOSIS — N183 Chronic kidney disease, stage 3 unspecified: Secondary | ICD-10-CM

## 2015-10-15 DIAGNOSIS — G4730 Sleep apnea, unspecified: Secondary | ICD-10-CM | POA: Diagnosis present

## 2015-10-15 DIAGNOSIS — I248 Other forms of acute ischemic heart disease: Secondary | ICD-10-CM | POA: Diagnosis present

## 2015-10-15 DIAGNOSIS — I10 Essential (primary) hypertension: Secondary | ICD-10-CM | POA: Diagnosis not present

## 2015-10-15 DIAGNOSIS — E11649 Type 2 diabetes mellitus with hypoglycemia without coma: Secondary | ICD-10-CM | POA: Diagnosis present

## 2015-10-15 DIAGNOSIS — R05 Cough: Secondary | ICD-10-CM

## 2015-10-15 DIAGNOSIS — R7989 Other specified abnormal findings of blood chemistry: Secondary | ICD-10-CM

## 2015-10-15 DIAGNOSIS — J811 Chronic pulmonary edema: Secondary | ICD-10-CM

## 2015-10-15 DIAGNOSIS — R778 Other specified abnormalities of plasma proteins: Secondary | ICD-10-CM

## 2015-10-15 DIAGNOSIS — M1990 Unspecified osteoarthritis, unspecified site: Secondary | ICD-10-CM | POA: Diagnosis present

## 2015-10-15 DIAGNOSIS — Z7982 Long term (current) use of aspirin: Secondary | ICD-10-CM | POA: Diagnosis not present

## 2015-10-15 DIAGNOSIS — D649 Anemia, unspecified: Secondary | ICD-10-CM

## 2015-10-15 DIAGNOSIS — I4891 Unspecified atrial fibrillation: Secondary | ICD-10-CM | POA: Diagnosis present

## 2015-10-15 DIAGNOSIS — N289 Disorder of kidney and ureter, unspecified: Secondary | ICD-10-CM | POA: Diagnosis not present

## 2015-10-15 DIAGNOSIS — Z950 Presence of cardiac pacemaker: Secondary | ICD-10-CM

## 2015-10-15 DIAGNOSIS — Z7901 Long term (current) use of anticoagulants: Secondary | ICD-10-CM | POA: Diagnosis not present

## 2015-10-15 DIAGNOSIS — I083 Combined rheumatic disorders of mitral, aortic and tricuspid valves: Secondary | ICD-10-CM | POA: Diagnosis present

## 2015-10-15 DIAGNOSIS — D631 Anemia in chronic kidney disease: Secondary | ICD-10-CM | POA: Diagnosis present

## 2015-10-15 DIAGNOSIS — J449 Chronic obstructive pulmonary disease, unspecified: Secondary | ICD-10-CM

## 2015-10-15 DIAGNOSIS — R079 Chest pain, unspecified: Secondary | ICD-10-CM | POA: Diagnosis not present

## 2015-10-15 DIAGNOSIS — R059 Cough, unspecified: Secondary | ICD-10-CM

## 2015-10-15 HISTORY — DX: Synovial cyst of popliteal space (Baker), unspecified knee: M71.20

## 2015-10-15 HISTORY — DX: Chronic obstructive pulmonary disease, unspecified: J44.9

## 2015-10-15 HISTORY — DX: Heart failure, unspecified: I50.9

## 2015-10-15 LAB — CBC
HEMATOCRIT: 30 % — AB (ref 36.0–46.0)
Hemoglobin: 9.6 g/dL — ABNORMAL LOW (ref 12.0–15.0)
MCH: 28.6 pg (ref 26.0–34.0)
MCHC: 32 g/dL (ref 30.0–36.0)
MCV: 89.3 fL (ref 78.0–100.0)
Platelets: 302 10*3/uL (ref 150–400)
RBC: 3.36 MIL/uL — AB (ref 3.87–5.11)
RDW: 14.9 % (ref 11.5–15.5)
WBC: 9.8 10*3/uL (ref 4.0–10.5)

## 2015-10-15 LAB — BASIC METABOLIC PANEL
Anion gap: 7 (ref 5–15)
BUN: 47 mg/dL — ABNORMAL HIGH (ref 6–20)
CHLORIDE: 111 mmol/L (ref 101–111)
CO2: 22 mmol/L (ref 22–32)
Calcium: 8.8 mg/dL — ABNORMAL LOW (ref 8.9–10.3)
Creatinine, Ser: 2.2 mg/dL — ABNORMAL HIGH (ref 0.44–1.00)
GFR calc Af Amer: 22 mL/min — ABNORMAL LOW (ref >60)
GFR calc non Af Amer: 19 mL/min — ABNORMAL LOW (ref >60)
Glucose, Bld: 88 mg/dL (ref 65–99)
POTASSIUM: 4.4 mmol/L (ref 3.5–5.1)
SODIUM: 140 mmol/L (ref 135–145)

## 2015-10-15 LAB — TROPONIN I
Troponin I: 0.44 ng/mL — ABNORMAL HIGH (ref <0.031)
Troponin I: 0.42 ng/mL — ABNORMAL HIGH (ref <0.031)

## 2015-10-15 LAB — GLUCOSE, CAPILLARY: Glucose-Capillary: 90 mg/dL (ref 65–99)

## 2015-10-15 LAB — BRAIN NATRIURETIC PEPTIDE: B Natriuretic Peptide: 616 pg/mL — ABNORMAL HIGH (ref 0.0–100.0)

## 2015-10-15 LAB — PROTIME-INR
INR: 3.2 — ABNORMAL HIGH (ref 0.00–1.49)
Prothrombin Time: 32.1 seconds — ABNORMAL HIGH (ref 11.6–15.2)

## 2015-10-15 MED ORDER — LORATADINE 10 MG PO TABS
10.0000 mg | ORAL_TABLET | Freq: Every day | ORAL | Status: DC
  Administered 2015-10-16 – 2015-10-18 (×3): 10 mg via ORAL
  Filled 2015-10-15 (×3): qty 1

## 2015-10-15 MED ORDER — FUROSEMIDE 10 MG/ML IJ SOLN
40.0000 mg | Freq: Once | INTRAMUSCULAR | Status: DC
  Filled 2015-10-15: qty 4

## 2015-10-15 MED ORDER — NITROGLYCERIN 2 % TD OINT
1.0000 [in_us] | TOPICAL_OINTMENT | Freq: Four times a day (QID) | TRANSDERMAL | Status: DC
  Administered 2015-10-15 – 2015-10-18 (×11): 1 [in_us] via TOPICAL
  Filled 2015-10-15 (×10): qty 1

## 2015-10-15 MED ORDER — SODIUM CHLORIDE 0.9 % IV SOLN: 250.0000 mL | INTRAVENOUS | Status: DC | PRN

## 2015-10-15 MED ORDER — FUROSEMIDE 10 MG/ML IJ SOLN
20.0000 mg | Freq: Two times a day (BID) | INTRAMUSCULAR | Status: DC
  Administered 2015-10-16 – 2015-10-18 (×5): 20 mg via INTRAVENOUS
  Filled 2015-10-15 (×5): qty 2

## 2015-10-15 MED ORDER — OXYBUTYNIN CHLORIDE ER 5 MG PO TB24
5.0000 mg | ORAL_TABLET | Freq: Every day | ORAL | Status: DC
  Administered 2015-10-15 – 2015-10-17 (×3): 5 mg via ORAL
  Filled 2015-10-15 (×3): qty 1

## 2015-10-15 MED ORDER — SPIRONOLACTONE 25 MG PO TABS
25.0000 mg | ORAL_TABLET | Freq: Every day | ORAL | Status: DC
  Administered 2015-10-16: 25 mg via ORAL
  Filled 2015-10-15: qty 1

## 2015-10-15 MED ORDER — INSULIN DETEMIR 100 UNIT/ML ~~LOC~~ SOLN
43.0000 [IU] | Freq: Every day | SUBCUTANEOUS | Status: DC
  Administered 2015-10-15 – 2015-10-16 (×2): 43 [IU] via SUBCUTANEOUS
  Filled 2015-10-15 (×3): qty 0.43

## 2015-10-15 MED ORDER — NITROGLYCERIN 0.4 MG/SPRAY TL SOLN: 1.0000 | Freq: Once | Status: DC

## 2015-10-15 MED ORDER — FUROSEMIDE 10 MG/ML IJ SOLN
40.0000 mg | Freq: Once | INTRAMUSCULAR | Status: AC
  Administered 2015-10-15: 40 mg via INTRAVENOUS

## 2015-10-15 MED ORDER — FERROUS SULFATE 325 (65 FE) MG PO TABS
325.0000 mg | ORAL_TABLET | Freq: Every day | ORAL | Status: DC
  Administered 2015-10-16 – 2015-10-18 (×3): 325 mg via ORAL
  Filled 2015-10-15 (×3): qty 1

## 2015-10-15 MED ORDER — TRAMADOL HCL 50 MG PO TABS: 50.0000 mg | ORAL_TABLET | Freq: Four times a day (QID) | ORAL | Status: DC | PRN

## 2015-10-15 MED ORDER — HYDRALAZINE HCL 25 MG PO TABS
ORAL_TABLET | ORAL | Status: AC
  Filled 2015-10-15: qty 3

## 2015-10-15 MED ORDER — ATORVASTATIN CALCIUM 40 MG PO TABS
40.0000 mg | ORAL_TABLET | Freq: Every day | ORAL | Status: DC
  Administered 2015-10-15 – 2015-10-17 (×3): 40 mg via ORAL
  Filled 2015-10-15 (×3): qty 1

## 2015-10-15 MED ORDER — WARFARIN - PHARMACIST DOSING INPATIENT: Freq: Every day | Status: DC

## 2015-10-15 MED ORDER — SODIUM CHLORIDE 0.9% FLUSH
3.0000 mL | Freq: Two times a day (BID) | INTRAVENOUS | Status: DC
  Administered 2015-10-15 – 2015-10-18 (×5): 3 mL via INTRAVENOUS

## 2015-10-15 MED ORDER — ALBUTEROL SULFATE (2.5 MG/3ML) 0.083% IN NEBU
2.5000 mg | INHALATION_SOLUTION | Freq: Once | RESPIRATORY_TRACT | Status: AC
  Administered 2015-10-15: 2.5 mg via RESPIRATORY_TRACT
  Filled 2015-10-15: qty 3

## 2015-10-15 MED ORDER — ASPIRIN EC 81 MG PO TBEC
81.0000 mg | DELAYED_RELEASE_TABLET | Freq: Every morning | ORAL | Status: DC
  Administered 2015-10-16 – 2015-10-18 (×3): 81 mg via ORAL
  Filled 2015-10-15 (×3): qty 1

## 2015-10-15 MED ORDER — ALBUTEROL SULFATE (2.5 MG/3ML) 0.083% IN NEBU
3.0000 mL | INHALATION_SOLUTION | Freq: Four times a day (QID) | RESPIRATORY_TRACT | Status: DC | PRN
  Administered 2015-10-17: 3 mL via RESPIRATORY_TRACT
  Filled 2015-10-15: qty 3

## 2015-10-15 MED ORDER — NITROGLYCERIN 0.4 MG SL SUBL: 0.4000 mg | SUBLINGUAL_TABLET | SUBLINGUAL | Status: DC | PRN

## 2015-10-15 MED ORDER — OMEGA-3-ACID ETHYL ESTERS 1 G PO CAPS
1.0000 g | ORAL_CAPSULE | Freq: Two times a day (BID) | ORAL | Status: DC
  Administered 2015-10-15 – 2015-10-18 (×6): 1 g via ORAL
  Filled 2015-10-15 (×6): qty 1

## 2015-10-15 MED ORDER — VITAMIN D 1000 UNITS PO TABS
1000.0000 [IU] | ORAL_TABLET | Freq: Every morning | ORAL | Status: DC
  Administered 2015-10-16 – 2015-10-18 (×3): 1000 [IU] via ORAL
  Filled 2015-10-15 (×4): qty 1

## 2015-10-15 MED ORDER — IPRATROPIUM BROMIDE 0.02 % IN SOLN: 0.5000 mg | Freq: Once | RESPIRATORY_TRACT | Status: DC

## 2015-10-15 MED ORDER — INSULIN ASPART 100 UNIT/ML ~~LOC~~ SOLN: 0.0000 [IU] | Freq: Three times a day (TID) | SUBCUTANEOUS | Status: DC

## 2015-10-15 MED ORDER — ACETAMINOPHEN 325 MG PO TABS: 650.0000 mg | ORAL_TABLET | Freq: Four times a day (QID) | ORAL | Status: DC | PRN

## 2015-10-15 MED ORDER — CARVEDILOL 12.5 MG PO TABS
25.0000 mg | ORAL_TABLET | Freq: Two times a day (BID) | ORAL | Status: DC
  Administered 2015-10-15 – 2015-10-18 (×6): 25 mg via ORAL
  Filled 2015-10-15 (×6): qty 2

## 2015-10-15 MED ORDER — ACETAMINOPHEN 650 MG RE SUPP: 650.0000 mg | Freq: Four times a day (QID) | RECTAL | Status: DC | PRN

## 2015-10-15 MED ORDER — ALBUTEROL SULFATE (2.5 MG/3ML) 0.083% IN NEBU: 5.0000 mg | INHALATION_SOLUTION | Freq: Once | RESPIRATORY_TRACT | Status: DC

## 2015-10-15 MED ORDER — INSULIN ASPART 100 UNIT/ML ~~LOC~~ SOLN: 0.0000 [IU] | Freq: Every day | SUBCUTANEOUS | Status: DC

## 2015-10-15 MED ORDER — AMLODIPINE BESYLATE 5 MG PO TABS
10.0000 mg | ORAL_TABLET | Freq: Every morning | ORAL | Status: DC
  Administered 2015-10-16 – 2015-10-18 (×3): 10 mg via ORAL
  Filled 2015-10-15 (×3): qty 2

## 2015-10-15 MED ORDER — PANTOPRAZOLE SODIUM 40 MG PO TBEC
40.0000 mg | DELAYED_RELEASE_TABLET | Freq: Every day | ORAL | Status: DC
  Administered 2015-10-16 – 2015-10-18 (×3): 40 mg via ORAL
  Filled 2015-10-15 (×3): qty 1

## 2015-10-15 MED ORDER — OCUVITE-LUTEIN PO CAPS
1.0000 | ORAL_CAPSULE | Freq: Two times a day (BID) | ORAL | Status: DC
  Administered 2015-10-15 – 2015-10-18 (×6): 1 via ORAL
  Filled 2015-10-15 (×6): qty 1

## 2015-10-15 MED ORDER — SODIUM CHLORIDE 0.9% FLUSH
3.0000 mL | Freq: Two times a day (BID) | INTRAVENOUS | Status: DC
  Administered 2015-10-16 – 2015-10-18 (×3): 3 mL via INTRAVENOUS

## 2015-10-15 MED ORDER — SODIUM CHLORIDE 0.9% FLUSH: 3.0000 mL | INTRAVENOUS | Status: DC | PRN

## 2015-10-15 MED ORDER — HYDRALAZINE HCL 50 MG PO TABS
75.0000 mg | ORAL_TABLET | Freq: Three times a day (TID) | ORAL | Status: DC
  Administered 2015-10-15: 75 mg via ORAL
  Filled 2015-10-15 (×2): qty 1

## 2015-10-15 MED ORDER — IPRATROPIUM-ALBUTEROL 0.5-2.5 (3) MG/3ML IN SOLN
3.0000 mL | Freq: Once | RESPIRATORY_TRACT | Status: AC
  Administered 2015-10-15: 3 mL via RESPIRATORY_TRACT
  Filled 2015-10-15: qty 3

## 2015-10-15 NOTE — ED Provider Notes (Addendum)
CSN: [id number]     Arrival date & time 10/15/15  1646 History   First MD Initiated Contact with Patient 10/15/15 1655     Chief Complaint  Patient presents with  . Chest Pain   HPI Patient presents to the emergency room with complaints of shortness of breath. Patient has a history of congestive heart failure, COPD and atrial fibrillation. She recently traveled to [place] in while she was there she was admitted to the hospital for shortness of breath. This was a few weeks ago Patient states she was told her blood pressure was elevated and this contributed to fluid overload. She was started on a new blood pressure medication. Patient hasn't felt completely well over the last few weeks since that hospitalization. The patient states she took a nap this afternoon and when she woke up she felt acutely short of breath. She also had pain in her left chest. She denies any fevers. No coughing. Past Medical History  Diagnosis Date  . Atrial fibrillation ([place])   . Bradycardia   . Hypertension   . Hyperlipemia   . Diabetes mellitus without complication ([place])   . Sleep apnea     cpap - patient does not know settings   . Presence of permanent cardiac pacemaker   . Heart murmur   . Shortness of breath dyspnea     with exertion   . Stress incontinence   . GERD (gastroesophageal reflux disease)   . Arthritis   . Hepatitis     hx of Hep A - years ago   . COPD (chronic obstructive pulmonary disease) ([place])   . CHF (congestive heart failure) [facility])    Past Surgical History  Procedure Laterality Date  . Pacemaker insertion    . Cholecystectomy    . Bladder suspension    . Tubal ligation    . Robotic assisted total hysterectomy with bilateral salpingo oopherectomy Bilateral 08/16/2014    Procedure:  ROBOTIC ASSISTED TOTAL HYSTERECTOMY WITH BILATERAL SALPINGO OOPHORECTOMY AND LYMPHADENECTOMY;  Surgeon: Everitt Amber, MD;  Location: [facility];  Service: Gynecology;  Laterality: Bilateral;   Family History   Problem Relation Age of Onset  . Stomach cancer Father   . Colon cancer Brother    Social History  Substance Use Topics  . Smoking status: Never Smoker   . Smokeless tobacco: Never Used  . Alcohol Use: No   OB History    No data available     Review of Systems  All other systems reviewed and are negative.     Allergies  Sulfa antibiotics and Vasotec  Home Medications   Prior to Admission medications   Medication Sig Start Date End Date Taking? Authorizing Provider  acetaminophen (TYLENOL) 500 MG tablet Take 1,000 mg by mouth every 6 (six) hours as needed for moderate pain or headache.   Yes Historical Provider, MD  albuterol (PROVENTIL HFA;VENTOLIN HFA) 108 (90 BASE) MCG/ACT inhaler Inhale 1-2 puffs into the lungs every 6 (six) hours as needed for wheezing or shortness of breath.   Yes Historical Provider, MD  amLODipine (NORVASC) 10 MG tablet Take 10 mg by mouth every morning.    Yes Historical Provider, MD  aspirin EC 81 MG tablet Take 81 mg by mouth every morning.   Yes Historical Provider, MD  atorvastatin (LIPITOR) 40 MG tablet Take 40 mg by mouth at bedtime.   Yes Historical Provider, MD  carvedilol (COREG) 25 MG tablet Take 25 mg by mouth 2 (two) times daily with a  meal.   Yes Historical Provider, MD  cetirizine (ZYRTEC) 10 MG tablet Take 10 mg by mouth every morning.    Yes Historical Provider, MD  cholecalciferol (VITAMIN D) 1000 UNITS tablet Take 1,000 Units by mouth every morning.    Yes Historical Provider, MD  fenofibrate (TRICOR) 145 MG tablet Take 145 mg by mouth every morning.   Yes Historical Provider, MD  ferrous sulfate 325 (65 FE) MG tablet Take 325 mg by mouth daily with breakfast.   Yes Historical Provider, MD  furosemide (LASIX) 20 MG tablet Take 20 mg by mouth every Monday, Wednesday, and Friday.   Yes Historical Provider, MD  hydrALAZINE (APRESOLINE) 25 MG tablet Take 75 mg by mouth 3 (three) times daily.   Yes Historical Provider, MD  insulin detemir  (LEVEMIR) 100 UNIT/ML injection Inject 43 Units into the skin at bedtime.   Yes Historical Provider, MD  losartan (COZAAR) 100 MG tablet Take 100 mg by mouth every morning.    Yes Historical Provider, MD  Multiple Vitamins-Minerals (PRESERVISION AREDS) CAPS Take 1 capsule by mouth 2 (two) times daily.   Yes Historical Provider, MD  nitroGLYCERIN (NITROSTAT) 0.4 MG SL tablet Place 0.4 mg under the tongue every 5 (five) minutes as needed for chest pain.   Yes Historical Provider, MD  Omega-3 Fatty Acids (FISH OIL) 1000 MG CAPS Take 1,000 mg by mouth daily.    Yes Historical Provider, MD  omeprazole (PRILOSEC) 20 MG capsule Take 20 mg by mouth every morning.    Yes Historical Provider, MD  oxybutynin (DITROPAN-XL) 5 MG 24 hr tablet Take 5 mg by mouth at bedtime.   Yes Historical Provider, MD  traMADol (ULTRAM) 50 MG tablet Take 1 tablet (50 mg total) by mouth every 6 (six) hours as needed for moderate pain. 08/17/14  Yes Everitt Amber, MD  warfarin (COUMADIN) 3 MG tablet Take 3 mg by mouth See admin instructions. 3mg  all days but Sunday. **Do not take on Sundays*   Yes Historical Provider, MD   BP 167/98 mmHg  Pulse 63  Temp(Src) 97.8 F (36.6 C) (Axillary)  Resp 24  Ht 5\' 3"  (1.6 m)  Wt 90.2 kg  BMI 35.23 kg/m2  SpO2 97% Physical Exam  Constitutional: She appears distressed.  HENT:  Head: Normocephalic and atraumatic.  Right Ear: External ear normal.  Left Ear: External ear normal.  Eyes: Conjunctivae are normal. Right eye exhibits no discharge. Left eye exhibits no discharge. No scleral icterus.  Neck: Neck supple. No tracheal deviation present.  Cardiovascular: Normal rate, regular rhythm and intact distal pulses.   Pulmonary/Chest: No stridor. Tachypnea noted. She has wheezes. She has rales.  Distant breath sounds, crackles diffusely with inspiration, faint wheeze noted on expiration  Abdominal: Soft. Bowel sounds are normal. She exhibits no distension. There is no tenderness. There is no  rebound and no guarding.  Musculoskeletal: She exhibits edema. She exhibits no tenderness.  Edema bilateral lower extremities  Neurological: She is alert. She has normal strength. No cranial nerve deficit (no facial droop, extraocular movements intact, no slurred speech) or sensory deficit. She exhibits normal muscle tone. She displays no seizure activity. Coordination normal.  Skin: Skin is warm and dry. No rash noted. She is not diaphoretic.  Psychiatric: She has a normal mood and affect.  Nursing note and vitals reviewed.   ED Course  .Critical Care Performed by: Dorie Rank Authorized by: Dorie Rank Total critical care time: 30 minutes Critical care was time spent personally by me  on the following activities: discussions with consultants, examination of patient, ordering and performing treatments and interventions, obtaining history from patient or surrogate, ordering and review of laboratory studies, ordering and review of radiographic studies, re-evaluation of patient's condition and pulse oximetry.     Medications given in the ED Medications  nitroGLYCERIN (NITROLINGUAL) 0.4 MG/SPRAY spray 1 spray (not administered)  nitroGLYCERIN (NITROGLYN) 2 % ointment 1 inch (not administered)  furosemide (LASIX) injection 40 mg (not administered)  ipratropium-albuterol (DUONEB) 0.5-2.5 (3) MG/3ML nebulizer solution 3 mL (3 mLs Nebulization Given 10/15/15 1740)  albuterol (PROVENTIL) (2.5 MG/3ML) 0.083% nebulizer solution 2.5 mg (2.5 mg Nebulization Given 10/15/15 1740)     Labs Review Labs Reviewed  BASIC METABOLIC PANEL - Abnormal; Notable for the following:    BUN 47 (*)    Creatinine, Ser 2.20 (*)    Calcium 8.8 (*)    GFR calc non Af Amer 19 (*)    GFR calc Af Amer 22 (*)    All other components within normal limits  CBC - Abnormal; Notable for the following:    RBC 3.36 (*)    Hemoglobin 9.6 (*)    HCT 30.0 (*)    All other components within normal limits  TROPONIN I -  Abnormal; Notable for the following:    Troponin I 0.44 (*)    All other components within normal limits  BRAIN NATRIURETIC PEPTIDE - Abnormal; Notable for the following:    B Natriuretic Peptide 616.0 (*)    All other components within normal limits  PROTIME-INR - Abnormal; Notable for the following:    Prothrombin Time 32.1 (*)    INR 3.20 (*)    All other components within normal limits    Imaging Review Dg Chest 1 View  10/15/2015  CLINICAL DATA:  Worsening chest pain and shortness of breath today. EXAM: CHEST 1 VIEW COMPARISON:  08/30/2014 FINDINGS: Increased interstitial densities in both lungs, particularly in the lower lungs. Evidence for central peribronchial thickening. Heart size is within normal limits. Atherosclerotic calcifications at the aortic arch. Again noted is a dual-chamber left cardiac pacemaker. Trachea is midline. Mild blunting at the costophrenic angles and difficult to exclude small effusions. IMPRESSION: Enlarged bilateral interstitial lung markings. Findings are suggestive for pulmonary edema. Electronically Signed   By: Markus Daft M.D.   On: 10/15/2015 17:25   I have personally reviewed and evaluated these images and lab results as part of my medical decision-making.   EKG Interpretation   Date/Time:  Tuesday October 15 2015 16:56:23 EDT Ventricular Rate:  67 PR Interval:    QRS Duration: 101 QT Interval:  440 QTC Calculation: 468 R Axis:   78 Text Interpretation:  Normal sinus rhythm Low voltage, precordial leads  Probable anteroseptal infarct, old Baseline wander in lead(s) V1 , makes  comparison difficulty but doubt significant change Confirmed by Lida Berkery   MD-J, Antwane Grose [id number]) on 10/15/2015 5:15:10 PM      MDM   Final diagnoses:  Acute on chronic congestive heart failure, unspecified congestive heart failure type (HCC)  Chronic obstructive pulmonary disease, unspecified COPD type ([place])  Anemia, unspecified anemia type  Acute renal insufficiency   Elevated troponin    1908  patient is not currently having any chest pain. She's noted some improvement with the albuterol Atrovent treatment. Her laboratory tests suggest pulmonary edema and congestive heart failure. The patient's labs are also noticeable for worsening renal insufficiency, anemia, and an elevated troponin and BNP. It is possible her CHF could  be related to acute cardiac injury although the CHF could cause this mild troponin increase. .  Will need to continue to monitor her troponin.  Will need to admit to the hospital for further evaluation.    Dorie Rank, MD 10/15/15 1911  Added Crit Care  Time duration  Dorie Rank, MD 11/06/15 [phone]

## 2015-10-15 NOTE — ED Notes (Signed)
MD at bedside. 

## 2015-10-15 NOTE — ED Notes (Signed)
Pt reports worsening chest pain and shortness of breath today at 1330.

## 2015-10-15 NOTE — ED Notes (Signed)
sats 88 % on RM. Patient placed on 3 lpm of oxygen increased to 94%.

## 2015-10-15 NOTE — H&P (Signed)
TRH H&P   Patient Demographics:    Relda Gleason, is a 82 y.o. female  MRN: [id number]   DOB - [birth year]  Admit Date - 10/15/2015  Outpatient Primary MD for the patient is Chip Boer, PA-C  Referring MD/NP/PA: Marye Round  Outpatient Specialists:   Patient coming from: home  Chief Complaint  Patient presents with  . Chest Pain      HPI:    Asuzena Volcy  is a 82 y.o. female, w hypertension, hyperlipidemia, Pafib (Chadsvasc2=7), Dm2, who presents with complaints of dyspnea.     She recently traveled to [place] in while she was there she was admitted to the hospital for shortness of breath. This was a few weeks ago Patient states she was told her blood pressure was elevated and this contributed to fluid overload. She was started on a new blood pressure medication. Patient hasn't felt completely well over the last few weeks since that hospitalization. The patient states she took a nap this afternoon and when she woke up she felt acutely short of breath. She also had pain in her left chest. She denies any fevers. No coughing.  In Ed,  Pt had CXR which showed increase in vascular congestion.  Pt had slightly + trop.  Pt denies cp, pt had acute on chronic renal failure  Bun/ creat =47/2.20,  Baseline 1.5 4 weeks ago  Pt will be admitted for CHF.        Review of systems:    In addition to the HPI above,  No Fever-chills, No Headache, No changes with Vision or hearing, No problems swallowing food or Liquids, No Chest pain, No Abdominal pain, No Nausea or Vommitting, Bowel movements are regular, No Blood in stool or Urine, No dysuria, No new skin rashes or bruises, No new joints pains-aches,  No new weakness, tingling, numbness in any extremity, No recent weight gain or loss, No polyuria, polydypsia or polyphagia, No significant Mental Stressors.  A full 10 point Review of  Systems was done, except as stated above, all other Review of Systems were negative.   With Past History of the following :    Past Medical History  Diagnosis Date  . Atrial fibrillation ([place])   . Bradycardia   . Hypertension   . Hyperlipemia   . Diabetes mellitus without complication ([place])   . Sleep apnea     cpap - patient does not know settings   . Presence of permanent cardiac pacemaker   . Heart murmur   . Shortness of breath dyspnea     with exertion   . Stress incontinence   . GERD (gastroesophageal reflux disease)   . Arthritis   . Hepatitis     hx of Hep A - years ago   . COPD (chronic obstructive pulmonary disease) ([place])   . CHF (congestive heart failure) ([place])  Past Surgical History  Procedure Laterality Date  . Pacemaker insertion    . Cholecystectomy    . Bladder suspension    . Tubal ligation    . Robotic assisted total hysterectomy with bilateral salpingo oopherectomy Bilateral 08/16/2014    Procedure:  ROBOTIC ASSISTED TOTAL HYSTERECTOMY WITH BILATERAL SALPINGO OOPHORECTOMY AND LYMPHADENECTOMY;  Surgeon: Everitt Amber, MD;  Location: [facility];  Service: Gynecology;  Laterality: Bilateral;      Social History:     Social History  Substance Use Topics  . Smoking status: Never Smoker   . Smokeless tobacco: Never Used  . Alcohol Use: No     Lives - at home  Mobility - ambulates     Family History :     Family History  Problem Relation Age of Onset  . Stomach cancer Father   . Colon cancer Brother       Home Medications:   Prior to Admission medications   Medication Sig Start Date End Date Taking? Authorizing Provider  acetaminophen (TYLENOL) 500 MG tablet Take 1,000 mg by mouth every 6 (six) hours as needed for moderate pain or headache.   Yes Historical Provider, MD  albuterol (PROVENTIL HFA;VENTOLIN HFA) 108 (90 BASE) MCG/ACT inhaler Inhale 1-2 puffs into the lungs every 6 (six) hours as needed for wheezing or shortness of breath.    Yes Historical Provider, MD  amLODipine (NORVASC) 10 MG tablet Take 10 mg by mouth every morning.    Yes Historical Provider, MD  aspirin EC 81 MG tablet Take 81 mg by mouth every morning.   Yes Historical Provider, MD  atorvastatin (LIPITOR) 40 MG tablet Take 40 mg by mouth at bedtime.   Yes Historical Provider, MD  carvedilol (COREG) 25 MG tablet Take 25 mg by mouth 2 (two) times daily with a meal.   Yes Historical Provider, MD  cetirizine (ZYRTEC) 10 MG tablet Take 10 mg by mouth every morning.    Yes Historical Provider, MD  cholecalciferol (VITAMIN D) 1000 UNITS tablet Take 1,000 Units by mouth every morning.    Yes Historical Provider, MD  fenofibrate (TRICOR) 145 MG tablet Take 145 mg by mouth every morning.   Yes Historical Provider, MD  ferrous sulfate 325 (65 FE) MG tablet Take 325 mg by mouth daily with breakfast.   Yes Historical Provider, MD  furosemide (LASIX) 20 MG tablet Take 20 mg by mouth every Monday, Wednesday, and Friday.   Yes Historical Provider, MD  hydrALAZINE (APRESOLINE) 25 MG tablet Take 75 mg by mouth 3 (three) times daily.   Yes Historical Provider, MD  insulin detemir (LEVEMIR) 100 UNIT/ML injection Inject 43 Units into the skin at bedtime.   Yes Historical Provider, MD  losartan (COZAAR) 100 MG tablet Take 100 mg by mouth every morning.    Yes Historical Provider, MD  Multiple Vitamins-Minerals (PRESERVISION AREDS) CAPS Take 1 capsule by mouth 2 (two) times daily.   Yes Historical Provider, MD  nitroGLYCERIN (NITROSTAT) 0.4 MG SL tablet Place 0.4 mg under the tongue every 5 (five) minutes as needed for chest pain.   Yes Historical Provider, MD  Omega-3 Fatty Acids (FISH OIL) 1000 MG CAPS Take 1,000 mg by mouth daily.    Yes Historical Provider, MD  omeprazole (PRILOSEC) 20 MG capsule Take 20 mg by mouth every morning.    Yes Historical Provider, MD  oxybutynin (DITROPAN-XL) 5 MG 24 hr tablet Take 5 mg by mouth at bedtime.   Yes Historical Provider, MD  traMADol  (  ULTRAM) 50 MG tablet Take 1 tablet (50 mg total) by mouth every 6 (six) hours as needed for moderate pain. 08/17/14  Yes Everitt Amber, MD  warfarin (COUMADIN) 3 MG tablet Take 3 mg by mouth See admin instructions. 3mg  all days but Sunday. **Do not take on Sundays*   Yes Historical Provider, MD     Allergies:     Allergies  Allergen Reactions  . Sulfa Antibiotics Other (See Comments)    Does not remember.   Michail Sermon [Enalaprilat] Swelling    Tongue.      Physical Exam:   Vitals  Blood pressure 159/59, pulse 61, temperature 97.8 F (36.6 C), temperature source Axillary, resp. rate 23, height 5\' 3"  (1.6 m), weight 90.2 kg (198 lb 13.7 oz), SpO2 95 %.   1. General lying in bed in NAD,    2. Normal affect and insight, Not Suicidal or Homicidal, Awake Alert, Oriented X 3.  3. No F.N deficits, ALL C.Nerves Intact, Strength 5/5 all 4 extremities, Sensation intact all 4 extremities, Plantars down going.  4. Ears and Eyes appear Normal, Conjunctivae clear, PERRLA. Moist Oral Mucosa.  5. Supple Neck, + JVD, No cervical lymphadenopathy appriciated, No Carotid Bruits.  6. Symmetrical Chest wall movement, Good air movement bilaterally, CTAB.  7. RRR, No Gallops, Rubs or Murmurs, No Parasternal Heave.  8. Positive Bowel Sounds, Abdomen Soft, No tenderness, No organomegaly appriciated,No rebound -guarding or rigidity.  9.  No Cyanosis, Normal Skin Turgor, No Skin Rash or Bruise. + trace edema  10. Good muscle tone,  joints appear normal , no effusions, Normal ROM.  11. No Palpable Lymph Nodes in Neck or Axillae     Data Review:    CBC  Recent Labs Lab 10/15/15 1654  WBC 9.8  HGB 9.6*  HCT 30.0*  PLT 302  MCV 89.3  MCH 28.6  MCHC 32.0  RDW 14.9   ------------------------------------------------------------------------------------------------------------------  Chemistries   Recent Labs Lab 10/15/15 1730  NA 140  K 4.4  CL 111  CO2 22  GLUCOSE 88  BUN 47*    CREATININE 2.20*  CALCIUM 8.8*   ------------------------------------------------------------------------------------------------------------------ estimated creatinine clearance is 19.2 mL/min (by C-G formula based on Cr of 2.2). ------------------------------------------------------------------------------------------------------------------ No results for input(s): TSH, T4TOTAL, T3FREE, THYROIDAB in the last 72 hours.  Invalid input(s): FREET3  Coagulation profile  Recent Labs Lab 10/15/15 1730  INR 3.20*   ------------------------------------------------------------------------------------------------------------------- No results for input(s): DDIMER in the last 72 hours. -------------------------------------------------------------------------------------------------------------------  Cardiac Enzymes  Recent Labs Lab 10/15/15 1730  TROPONINI 0.44*   ------------------------------------------------------------------------------------------------------------------    Component Value Date/Time   BNP 616.0* 10/15/2015 1730     ---------------------------------------------------------------------------------------------------------------  Urinalysis    Component Value Date/Time   COLORURINE STRAW* 08/30/2014 1838   APPEARANCEUR HAZY* 08/30/2014 1838   LABSPEC 1.020 08/30/2014 1838   PHURINE 7.0 08/30/2014 1838   GLUCOSEU NEGATIVE 08/30/2014 1838   HGBUR TRACE* 08/30/2014 1838   BILIRUBINUR NEGATIVE 08/30/2014 1838   KETONESUR NEGATIVE 08/30/2014 1838   PROTEINUR 100* 08/30/2014 1838   UROBILINOGEN 0.2 08/30/2014 1838   NITRITE NEGATIVE 08/30/2014 1838   LEUKOCYTESUR NEGATIVE 08/30/2014 1838    ----------------------------------------------------------------------------------------------------------------   Imaging Results:    Dg Chest 1 View  10/15/2015  CLINICAL DATA:  Worsening chest pain and shortness of breath today. EXAM: CHEST 1 VIEW COMPARISON:   08/30/2014 FINDINGS: Increased interstitial densities in both lungs, particularly in the lower lungs. Evidence for central peribronchial thickening. Heart size is within normal limits. Atherosclerotic calcifications at  the aortic arch. Again noted is a dual-chamber left cardiac pacemaker. Trachea is midline. Mild blunting at the costophrenic angles and difficult to exclude small effusions. IMPRESSION: Enlarged bilateral interstitial lung markings. Findings are suggestive for pulmonary edema. Electronically Signed   By: Markus Daft M.D.   On: 10/15/2015 17:25       Assessment & Plan:    Active Problems:   CHF (congestive heart failure) (HCC)   Renal insufficiency    1. Dyspnea secondary to CHF  2. CHF (unknown EF) Tele Trop i q6h x3 Check ck, ck mb, lipid Pt already anticoagulated on coumadin Lasix 20mg  iv bid Start spironolactone 25mg  po qday Cont carvedilol  3. ARF on CRF Hold Lisinopril Pt may have a new baseline Consider  urine sodium, urine creatinine, urine eosinophls, renal ultrasound Check cmp in am  4.Pafib Coumadin pharmacy to dose, cont cavedilol.  5. Dm2 fsbs ac and qhs, iss  6.  Anemia Consider check ferritin, iron, tibc, b12, folate, tsh, spep, upep  DVT Prophylaxis on coumadin  AM Labs Ordered, also please review Full Orders  Family Communication: Admission, patients condition and plan of care including tests being ordered have been discussed with the patient  who indicate understanding and agree with the plan and Code Status.  Code Status FULL CODE  Likely DC to  home  Condition GUARDED    Consults called: cardiolog consult ordered in computer  Admission status: inpatient  Time spent in minutes : 71minutes   Jani Gravel M.D on 10/15/2015 at 7:32 PM  Between 7am to 7pm - Pager - [phone]. After 7pm go to [email or web address] - password [facility]  [facility] - Office  [phone]

## 2015-10-15 NOTE — Progress Notes (Signed)
ANTICOAGULATION CONSULT NOTE - Initial Consult  Pharmacy Consult for Coumadin Indication: atrial fibrillation  Allergies  Allergen Reactions  . Sulfa Antibiotics Other (See Comments)    Does not remember.   Michail Sermon [Enalaprilat] Swelling    Tongue.     Patient Measurements: Height: 5\' 3"  (160 cm) Weight: 198 lb 13.7 oz (90.2 kg) IBW/kg (Calculated) : 52.4  Vital Signs: Temp: 97.8 F (36.6 C) (06/20 1651) Temp Source: Axillary (06/20 1651) BP: 158/56 mmHg (06/20 1930) Pulse Rate: 61 (06/20 1930)  Labs:  Recent Labs  10/15/15 1654 10/15/15 1730  HGB 9.6*  --   HCT 30.0*  --   PLT 302  --   LABPROT  --  32.1*  INR  --  3.20*  CREATININE  --  2.20*  TROPONINI  --  0.44*    Estimated Creatinine Clearance: 19.2 mL/min (by C-G formula based on Cr of 2.2).   Medical History: Past Medical History  Diagnosis Date  . Atrial fibrillation ([place])   . Bradycardia   . Hypertension   . Hyperlipemia   . Diabetes mellitus without complication ([place])   . Sleep apnea     cpap - patient does not know settings   . Presence of permanent cardiac pacemaker   . Heart murmur   . Shortness of breath dyspnea     with exertion   . Stress incontinence   . GERD (gastroesophageal reflux disease)   . Arthritis   . Hepatitis     hx of Hep A - years ago   . COPD (chronic obstructive pulmonary disease) ([place])   . CHF (congestive heart failure) (HCC)     Medications:  See med rec  Assessment:  82 yo female presents with SOb. Patient has a history of CHF, COPD, and atrial Fibrillation. Consulted for continuation of coumadin. He takes 3mg  daily except none on Sundays. Patient has already taken dose today and INR is 3.2.   Goal of Therapy:  INR 2-3 Monitor platelets by anticoagulation protocol: Yes   Plan:  Coumadin already taken today PT/INR daily Monitor for S/S of bleeding  Isac Sarna, BS Vena Austria, BCPS Clinical Pharmacist Pager [phone] 10/15/2015,8:20 PM

## 2015-10-16 ENCOUNTER — Inpatient Hospital Stay (HOSPITAL_COMMUNITY): Payer: Medicare Other

## 2015-10-16 DIAGNOSIS — N179 Acute kidney failure, unspecified: Secondary | ICD-10-CM

## 2015-10-16 DIAGNOSIS — I25118 Atherosclerotic heart disease of native coronary artery with other forms of angina pectoris: Secondary | ICD-10-CM

## 2015-10-16 DIAGNOSIS — N189 Chronic kidney disease, unspecified: Secondary | ICD-10-CM

## 2015-10-16 DIAGNOSIS — Z950 Presence of cardiac pacemaker: Secondary | ICD-10-CM

## 2015-10-16 DIAGNOSIS — D62 Acute posthemorrhagic anemia: Secondary | ICD-10-CM

## 2015-10-16 DIAGNOSIS — N289 Disorder of kidney and ureter, unspecified: Secondary | ICD-10-CM

## 2015-10-16 DIAGNOSIS — I509 Heart failure, unspecified: Secondary | ICD-10-CM

## 2015-10-16 DIAGNOSIS — I5033 Acute on chronic diastolic (congestive) heart failure: Secondary | ICD-10-CM

## 2015-10-16 DIAGNOSIS — I248 Other forms of acute ischemic heart disease: Secondary | ICD-10-CM

## 2015-10-16 LAB — ECHOCARDIOGRAM COMPLETE
AO mean calculated velocity dopler: 155 cm/s
AOPV: 0.63 m/s
AOVTI: 66.1 cm
AV Area VTI: 1.59 cm2
AV Area mean vel: 1.58 cm2
AV area mean vel ind: 0.8 cm2/m2
AV peak Index: 0.81
AV vel: 1.36
AVA: 1.36 cm2
AVAREAVTIIND: 0.69 cm2/m2
AVCELMEANRAT: 0.62
AVG: 12 mmHg
AVLVOTPG: 9 mmHg
AVPG: 24 mmHg
AVPKVEL: 244 cm/s
CHL CUP AV VALUE AREA INDEX: 0.69
CHL CUP MV DEC (S): 197
EERAT: 28.93
EWDT: 197 ms
FS: 55 % — AB (ref 28–44)
Height: 63 in
IV/PV OW: 0.98
LADIAMINDEX: 2.49 cm/m2
LASIZE: 49 mm
LAVOL: 66.9 mL
LAVOLA4C: 59.9 mL
LAVOLIN: 34 mL/m2
LEFT ATRIUM END SYS DIAM: 49 mm
LV PW d: 9.76 mm — AB (ref 0.6–1.1)
LV SIMPSON'S DISK: 61
LV dias vol index: 25 mL/m2
LVDIAVOL: 48 mL (ref 46–106)
LVEEAVG: 28.93
LVEEMED: 28.93
LVELAT: 5.22 cm/s
LVOT VTI: 35.3 cm
LVOT area: 2.54 cm2
LVOT diameter: 18 mm
LVOT peak VTI: 0.53 cm
LVOT peak vel: 153 cm/s
LVOTSV: 90 mL
LVSYSVOL: 19 mL (ref 14–42)
LVSYSVOLIN: 10 mL/m2
MV pk A vel: 113 m/s
MV pk E vel: 151 m/s
MVPG: 9 mmHg
RV TAPSE: 25.2 mm
Reg peak vel: 330 cm/s
Stroke v: 29 ml
TDI e' lateral: 5.22
TDI e' medial: 5.77
TR max vel: 330 cm/s
VTI: 185 cm
Weight: 3358.05 oz

## 2015-10-16 LAB — GLUCOSE, CAPILLARY
GLUCOSE-CAPILLARY: 96 mg/dL (ref 65–99)
GLUCOSE-CAPILLARY: 135 mg/dL — AB (ref 65–99)
Glucose-Capillary: 137 mg/dL — ABNORMAL HIGH (ref 65–99)
Glucose-Capillary: 142 mg/dL — ABNORMAL HIGH (ref 65–99)
Glucose-Capillary: 179 mg/dL — ABNORMAL HIGH (ref 65–99)

## 2015-10-16 LAB — COMPREHENSIVE METABOLIC PANEL
ALK PHOS: 29 U/L — AB (ref 38–126)
ALT: 18 U/L (ref 14–54)
AST: 19 U/L (ref 15–41)
Albumin: 3.3 g/dL — ABNORMAL LOW (ref 3.5–5.0)
Anion gap: 6 (ref 5–15)
BUN: 47 mg/dL — AB (ref 6–20)
CALCIUM: 8.3 mg/dL — AB (ref 8.9–10.3)
CO2: 23 mmol/L (ref 22–32)
CREATININE: 2.3 mg/dL — AB (ref 0.44–1.00)
Chloride: 112 mmol/L — ABNORMAL HIGH (ref 101–111)
GFR, EST AFRICAN AMERICAN: 21 mL/min — AB (ref >60)
GFR, EST NON AFRICAN AMERICAN: 18 mL/min — AB (ref >60)
Glucose, Bld: 78 mg/dL (ref 65–99)
Potassium: 4.3 mmol/L (ref 3.5–5.1)
Sodium: 141 mmol/L (ref 135–145)
Total Bilirubin: 0.8 mg/dL (ref 0.3–1.2)
Total Protein: 6.2 g/dL — ABNORMAL LOW (ref 6.5–8.1)

## 2015-10-16 LAB — CBC
HCT: 24.6 % — ABNORMAL LOW (ref 36.0–46.0)
Hemoglobin: 7.9 g/dL — ABNORMAL LOW (ref 12.0–15.0)
MCH: 28.4 pg (ref 26.0–34.0)
MCHC: 32.1 g/dL (ref 30.0–36.0)
MCV: 88.5 fL (ref 78.0–100.0)
PLATELETS: 245 10*3/uL (ref 150–400)
RBC: 2.78 MIL/uL — AB (ref 3.87–5.11)
RDW: 14.7 % (ref 11.5–15.5)
WBC: 8.2 10*3/uL (ref 4.0–10.5)

## 2015-10-16 LAB — TROPONIN I
TROPONIN I: 0.49 ng/mL — AB (ref <0.031)
Troponin I: 0.56 ng/mL (ref <0.031)

## 2015-10-16 LAB — PROTIME-INR
INR: 3.61 — AB (ref 0.00–1.49)
PROTHROMBIN TIME: 35.2 s — AB (ref 11.6–15.2)

## 2015-10-16 LAB — MRSA PCR SCREENING: MRSA BY PCR: NEGATIVE

## 2015-10-16 MED ORDER — HYDRALAZINE HCL 25 MG PO TABS
75.0000 mg | ORAL_TABLET | Freq: Three times a day (TID) | ORAL | Status: DC
  Administered 2015-10-16 – 2015-10-18 (×7): 75 mg via ORAL
  Filled 2015-10-16 (×7): qty 3

## 2015-10-16 MED ORDER — HYDRALAZINE HCL 25 MG PO TABS: 75.0000 mg | ORAL_TABLET | Freq: Three times a day (TID) | ORAL | Status: DC

## 2015-10-16 MED ORDER — GUAIFENESIN-DM 100-10 MG/5ML PO SYRP
5.0000 mL | ORAL_SOLUTION | ORAL | Status: DC | PRN
  Administered 2015-10-16 – 2015-10-18 (×5): 5 mL via ORAL
  Filled 2015-10-16 (×5): qty 5

## 2015-10-16 NOTE — Care Management Important Message (Signed)
Important Message  Patient Details  Name: Lawanna Shiraishi MRN: [id number] Date of Birth: 1925-07-28   Medicare Important Message Given:  Yes    Sherald Barge, RN 10/16/2015, 11:25 AM

## 2015-10-16 NOTE — Care Management Note (Signed)
Case Management Note  Patient Details  Name: Summer Wong MRN: [id number] Date of Birth: 27-Sep-1925  Subjective/Objective:                  Pt is from home, lives alone and is ind with ADL's. Pt's family at bedside. Pt is ind with ADL's. Pt has a walker she uses when needed. Pt has CPAP but no home O2 PTA. Pt has PCP and drives herself to appointments. Pt plans to return home at DC. Pt's family has requested consideration of [place] RN for r/u at DC and home O2 assessment. Pt has no worked with [facility] services in the past and will need to chose [facility] agency at [place].   Action/Plan: Will cont to follow for DC planning.   Expected Discharge Date:    10/18/2015              Expected Discharge Plan:  [place]  In-House Referral:  NA  Discharge planning Services  CM Consult  Post Acute Care Choice:  Home Health Choice offered to:  Patient  DME Arranged:    DME Agency:     HH Arranged:  RN [place] Agency:     Status of Service:  In process, will continue to follow  If discussed at Long Length of Stay Meetings, dates discussed:    Additional Comments:  Sherald Barge, RN 10/16/2015, 4:09 PM

## 2015-10-16 NOTE — Progress Notes (Signed)
Initial Nutrition Assessment  DOCUMENTATION CODES:  Obesity unspecified  INTERVENTION:  Brief CHF education  NUTRITION DIAGNOSIS:  Inadequate oral intake related to Severe SOB as evidenced by Report of going 24 hours without eating.  GOAL:  Patient will meet greater than or equal to 90% of their needs  MONITOR:  PO intake, Labs, Weight trends, I & O's  REASON FOR ASSESSMENT:  Malnutrition Screening Tool    ASSESSMENT:  82 y/o female PMHx HTN, HLD, DM, GERD, COPD, CHF, Afib, presents with acute SOB and chest pain. Admitted for CHF exacerbation.   Pt reports she was eating very well up until 24 hours ago. She says yesterday she was just too tired, fatigue and SOB to eat anything. She took a Administrator, Vitamin D, and iron supplement at home. She didn't drink any nutritional supplements. She says she followed a low salt diet. However, she defined this as not using the salt shaker. She does report eating high sodium meals such as frozen meals.   RD gave very brief HF diet education. Explained the idea of the "Sodium budget" and how eating high sodium items likes frozen dinners, cans of soup, deli meats etc will "use up" most of her budget. Gave her goal of <2 liters fluid, but stated MD might recommend less.   Her UBW is ~198 lbs. However, she admits to not weighing herself very frequently. She reports having chronic BLE edema, but recently has noticed increased swelling in her legs.   At this time she reports her symptoms are improving. Her appetite is returning. She declined any supplements at this time. No n/v/c/d.   Labs reviewed:A1C in process. Anemia: h/h: 7.9/2.78   Recent Labs Lab 10/15/15 1730 10/16/15 0208  NA 140 141  K 4.4 4.3  CL 111 112*  CO2 22 23  BUN 47* 47*  CREATININE 2.20* 2.30*  CALCIUM 8.8* 8.3*  GLUCOSE 88 78    Diet Order:  Diet Heart Room service appropriate?: Yes; Fluid consistency:: Thin  Skin: Ecchymotic Arm, legs, buttocks.   Last BM:   Unknown  Height:  Ht Readings from Last 1 Encounters:  10/15/15 5\' 3"  (1.6 m)   Weight:  Wt Readings from Last 1 Encounters:  10/16/15 209 lb 14.1 oz (95.2 kg)   Wt Readings from Last 10 Encounters:  10/16/15 209 lb 14.1 oz (95.2 kg)  12/10/14 193 lb 6.4 oz (87.726 kg)  10/16/14 195 lb 14.4 oz (88.86 kg)  09/07/14 192 lb (87.091 kg)  08/30/14 192 lb (87.091 kg)  08/16/14 192 lb (87.091 kg)  08/09/14 192 lb (87.091 kg)  07/11/14 194 lb 11.2 oz (88.315 kg)  06/27/14 191 lb (86.637 kg)   Ideal Body Weight:  52.27 kg  BMI:  Body mass index is 37.19 kg/(m^2).  Estimated Nutritional Needs:  Kcal:  1400-1600 (15-17 kcal/kg bw) Protein:  50-60 gPro Fluid:  1.5 liters fluid  EDUCATION NEEDS:  Education needs addressed  Burtis Junes RD, LDN, CNSC Clinical Nutrition Pager: [phone] 10/16/2015 3:19 PM

## 2015-10-16 NOTE — Consult Note (Signed)
Cardiology Consultation   Patient ID: Summer Wong; [id number]; 1925-05-09   Admit date: 10/15/2015 Date of Consult: 10/16/2015  Referring MD: Dr. Jerilee Hoh Cardiologist: [facility] Dr. Gennette Pac Consulting Cardiologist: Dr. Bronson Ing  Patient Care Team: [name], PA-C as PCP - General (Family Medicine)    Reason for Consultation: CHF   History of Present Illness: Summer Wong is a 82 y.o. female with a hx of hypertension, diabetes, hyperlipidemia, atrial fibrillation/sick sinus syndrome status post dual-chamber pacemaker with generator change and cardioversion in 2016 maintained on carvedilol and Coumadin. Silent MI in 2006. No ischemia on stress test in 2015 echo in 2015 EF 55-60% and mildly dilated left atrium. She was last seen in [facility] by Dr. Gennette Pac April 2017 and was on antibiotics for cellulitis.  Patient recently traveled to [place] for a month and was admitted to the hospital for shortness of breath. She was told her blood pressure was elevated and contributed to fluid overload. She was started on hydralazine. Discharged 09/28/15. Travelled home Monday and ate fast food the whole way home. Awakened Tues with acute dyspnea and chest heaviness. She only gets chest tightness when she is in CHF. Denies exertional symptoms otherwise. Lives alone, but son next door. Very sedantary. chest x-ray that shows vascular congestion. Creatinine is 2.3 baseline of 1.5, troponins elevated but flat, d-dimer elevated, hemoglobin 7.9    Past Medical History  Diagnosis Date  . Atrial fibrillation ([place])   . Bradycardia   . Hypertension   . Hyperlipemia   . Diabetes mellitus without complication ([place])   . Sleep apnea     cpap - patient does not know settings   . Presence of permanent cardiac pacemaker   . Heart murmur   . Shortness of breath dyspnea     with exertion   . Stress incontinence   . GERD (gastroesophageal reflux disease)   . Arthritis   . Hepatitis     hx of Hep  A - years ago   . COPD (chronic obstructive pulmonary disease) ([place])   . CHF (congestive heart failure) ([place])   . Baker's cyst     Past Surgical History  Procedure Laterality Date  . Pacemaker insertion    . Cholecystectomy    . Bladder suspension    . Tubal ligation    . Robotic assisted total hysterectomy with bilateral salpingo oopherectomy Bilateral 08/16/2014    Procedure:  ROBOTIC ASSISTED TOTAL HYSTERECTOMY WITH BILATERAL SALPINGO OOPHORECTOMY AND LYMPHADENECTOMY;  Surgeon: Everitt Amber, MD;  Location: [facility] ORS;  Service: Gynecology;  Laterality: Bilateral;      Home Meds: Prior to Admission medications   Medication Sig Start Date End Date Taking? Authorizing Provider  acetaminophen (TYLENOL) 500 MG tablet Take 1,000 mg by mouth every 6 (six) hours as needed for moderate pain or headache.   Yes Historical Provider, MD  albuterol (PROVENTIL HFA;VENTOLIN HFA) 108 (90 BASE) MCG/ACT inhaler Inhale 1-2 puffs into the lungs every 6 (six) hours as needed for wheezing or shortness of breath.   Yes Historical Provider, MD  amLODipine (NORVASC) 10 MG tablet Take 10 mg by mouth every morning.    Yes Historical Provider, MD  aspirin EC 81 MG tablet Take 81 mg by mouth every morning.   Yes Historical Provider, MD  atorvastatin (LIPITOR) 40 MG tablet Take 40 mg by mouth at bedtime.   Yes Historical Provider, MD  carvedilol (COREG) 25 MG tablet Take 25 mg by mouth 2 (two) times daily with a meal.  Yes Historical Provider, MD  cetirizine (ZYRTEC) 10 MG tablet Take 10 mg by mouth every morning.    Yes Historical Provider, MD  cholecalciferol (VITAMIN D) 1000 UNITS tablet Take 1,000 Units by mouth every morning.    Yes Historical Provider, MD  fenofibrate (TRICOR) 145 MG tablet Take 145 mg by mouth every morning.   Yes Historical Provider, MD  ferrous sulfate 325 (65 FE) MG tablet Take 325 mg by mouth daily with breakfast.   Yes Historical Provider, MD  furosemide (LASIX) 20 MG tablet Take 20 mg by  mouth every Monday, Wednesday, and Friday.   Yes Historical Provider, MD  hydrALAZINE (APRESOLINE) 25 MG tablet Take 75 mg by mouth 3 (three) times daily.   Yes Historical Provider, MD  insulin detemir (LEVEMIR) 100 UNIT/ML injection Inject 43 Units into the skin at bedtime.   Yes Historical Provider, MD  losartan (COZAAR) 100 MG tablet Take 100 mg by mouth every morning.    Yes Historical Provider, MD  Multiple Vitamins-Minerals (PRESERVISION AREDS) CAPS Take 1 capsule by mouth 2 (two) times daily.   Yes Historical Provider, MD  nitroGLYCERIN (NITROSTAT) 0.4 MG SL tablet Place 0.4 mg under the tongue every 5 (five) minutes as needed for chest pain.   Yes Historical Provider, MD  Omega-3 Fatty Acids (FISH OIL) 1000 MG CAPS Take 1,000 mg by mouth daily.    Yes Historical Provider, MD  omeprazole (PRILOSEC) 20 MG capsule Take 20 mg by mouth every morning.    Yes Historical Provider, MD  oxybutynin (DITROPAN-XL) 5 MG 24 hr tablet Take 5 mg by mouth at bedtime.   Yes Historical Provider, MD  traMADol (ULTRAM) 50 MG tablet Take 1 tablet (50 mg total) by mouth every 6 (six) hours as needed for moderate pain. 08/17/14  Yes Everitt Amber, MD  warfarin (COUMADIN) 3 MG tablet Take 3 mg by mouth See admin instructions. 3mg  all days but Sunday. **Do not take on Sundays*   Yes Historical Provider, MD    Current Medications: . amLODipine  10 mg Oral q morning - 10a  . aspirin EC  81 mg Oral q morning - 10a  . atorvastatin  40 mg Oral QHS  . carvedilol  25 mg Oral BID WC  . cholecalciferol  1,000 Units Oral q morning - 10a  . ferrous sulfate  325 mg Oral Q breakfast  . furosemide  20 mg Intravenous BID  . hydrALAZINE  75 mg Oral TID  . insulin aspart  0-5 Units Subcutaneous QHS  . insulin aspart  0-9 Units Subcutaneous TID WC  . insulin detemir  43 Units Subcutaneous QHS  . loratadine  10 mg Oral Daily  . multivitamin-lutein  1 capsule Oral BID  . nitroGLYCERIN  1 inch Topical Q6H  . nitroGLYCERIN  1 spray  Sublingual Once  . omega-3 acid ethyl esters  1 g Oral BID  . oxybutynin  5 mg Oral QHS  . pantoprazole  40 mg Oral Daily  . sodium chloride flush  3 mL Intravenous Q12H  . sodium chloride flush  3 mL Intravenous Q12H  . spironolactone  25 mg Oral Daily  . Warfarin - Pharmacist Dosing Inpatient   Does not apply q1800     Allergies:    Allergies  Allergen Reactions  . Sulfa Antibiotics Other (See Comments)    Does not remember.   Michail Sermon [Enalaprilat] Swelling    Tongue.     Social History:   The patient  reports that  she has never smoked. She has never used smokeless tobacco. She reports that she does not drink alcohol or use illicit drugs.    Family History:   The patient's family history includes Colon cancer in her brother; Stomach cancer in her father.   ROS:  Please see the history of present illness.  Review of Systems  Constitution: Positive for weakness and malaise/fatigue.  Cardiovascular: Positive for dyspnea on exertion, leg swelling and orthopnea.  Respiratory: Positive for shortness of breath, sleep disturbances due to breathing and wheezing.   Hematologic/Lymphatic: Bruises/bleeds easily.   All other ROS reviewed and negative.      Vital Signs: Blood pressure 88/53, pulse 60, temperature 97.5 F (36.4 C), temperature source Oral, resp. rate 21, height 5\' 3"  (1.6 m), weight 209 lb 14.1 oz (95.2 kg), SpO2 100 %.   PHYSICAL EXAM: General:  Well nourished, well developed, in no acute distress  HEENT: normal Lymph: no adenopathy Neck: Increased JVD Endocrine:  No thryomegaly Vascular: No carotid bruits; FA pulses 2+ bilaterally without bruits Cardiac:  RRR; distant heart sounds.normal S1, S2; no murmur, rub, bruit, thrill, or heave Lungs:  Decreased breath sounds with scattered wheezing, and rales at bases Abd: soft, nontender, no hepatomegaly Ext: plus 1  Edema right greater than left, Good distal pulses bilaterally Musculoskeletal:  No deformities, BUE and  BLE strength normal and equal Skin: warm and dry Neuro:  CNs 2-12 intact, no focal abnormalities noted Psych:  Normal affect    EKG:  Normal sinus rhythm no acute change  Telemetry: Normal sinus rhythm Labs:  Recent Labs  10/15/15 1730 10/15/15 2038 10/16/15 0208 10/16/15 0859  TROPONINI 0.44* 0.42* 0.49* 0.56*   Lab Results  Component Value Date   WBC 8.2 10/16/2015   HGB 7.9* 10/16/2015   HCT 24.6* 10/16/2015   MCV 88.5 10/16/2015   PLT 245 10/16/2015    Recent Labs Lab 10/16/15 0208  NA 141  K 4.3  CL 112*  CO2 23  BUN 47*  CREATININE 2.30*  CALCIUM 8.3*  PROT 6.2*  BILITOT 0.8  ALKPHOS 29*  ALT 18  AST 19  GLUCOSE 78   No results found for: CHOL, HDL, LDLCALC, TRIG Lab Results  Component Value Date   DDIMER 3.47* 08/30/2014    Radiology/Studies:  Dg Chest 1 View  10/15/2015  CLINICAL DATA:  Worsening chest pain and shortness of breath today. EXAM: CHEST 1 VIEW COMPARISON:  08/30/2014 FINDINGS: Increased interstitial densities in both lungs, particularly in the lower lungs. Evidence for central peribronchial thickening. Heart size is within normal limits. Atherosclerotic calcifications at the aortic arch. Again noted is a dual-chamber left cardiac pacemaker. Trachea is midline. Mild blunting at the costophrenic angles and difficult to exclude small effusions. IMPRESSION: Enlarged bilateral interstitial lung markings. Findings are suggestive for pulmonary edema. Electronically Signed   By: Markus Daft M.D.   On: 10/15/2015 17:25   US Renal  10/16/2015  CLINICAL DATA:  Acute renal failure, worsening renal insufficiency EXAM: RENAL / URINARY TRACT ULTRASOUND COMPLETE COMPARISON:  12/14/2014 and 07/23/2014 FINDINGS: Right Kidney: Length: 9.6 cm. There is cortical thinning probable due to atrophy. No hydronephrosis. No renal calculi. No renal masses noted. Left Kidney: Length: 9.8 cm. There is cortical thinning probable due to atrophy. No hydronephrosis. No renal  calculi. There is hypoechoic cyst in upper pole measures 2.4 x 2.2 cm. On the prior CT scan this measures 2.3 cm. Bladder: Foley catheter is noted within decompressed urinary bladder. IMPRESSION: 1. Bilateral  renal cortical thinning probable due to atrophy. No hydronephrosis. No renal calculi. Stable cyst in upper pole of the left kidney. Decompressed urinary bladder with Foley catheter. Electronically Signed   By: Lahoma Crocker M.D.   On: 10/16/2015 10:57   Echo 03/02/2014 Permanent pacemaker implantation Normal left ventricular ejection fraction (55 - 60%) Degenerative mitral valve disease Mitral annular calcification Mitral regurgitation (mild) Dilated left atrium Aortic sclerosis Normal right ventricular contractile performance Dilated right atrium  Stress test 03/02/2014 No significant ischemia is noted on perfusion imaging - During stress: Global systolic function is normal. The ejection fraction was greater than 65%. The RV is dilated. - Mild coronary calcifications are noted  Electrophysiology 04/2014  11/05/2005 PM gen change and DC cardioversion.   Dual chamber PM placed for syncopal episodes in the ambulatory EKG monitor evidence to "severe bradycardia".    echo 2015 [place] Left Ventricle There is normal left ventricular chamber size, wall thickness and contraction. The visually estimated left ventricular ejection fraction is 55 - 60%. Underlying arrhythmia precludes accurate assessment of diastolic function. Mitral Valve There is annular calcification and the mitral leaflets are mildly thickened with equivocal leaflet prolapse. There is mild mitral regurgitation by color flow and continuous wave Doppler examinations. Left Atrium The left atrium is mildly dilated. Aortic Valve Suboptimally imaged aortic valve is mildly thickened and probably trileaflet with well-preserved excursion. There is no echocardiographic evidence of aortic regurgitation by color flow or  continuous wave Doppler examinations. There is no evidence of hemodynamically important aortic transvalvular gradient; the maximal instantaneous left ventricular outflow velocity is 1.5 m/s. Aorta The ascending thoracic aorta is normal in diameter at the level of the left ventricular outflow tract and sinuses of Valsalva; the aorta is suboptimally imaged above the sinotubular junction and at the transverse arch. Pulmonary Artery The pulmonary artery is suboptimally imaged. Pulmonic Valve The pulmonary valve is suboptimally imaged. There is no detectable pulmonary regurgitation by color flow Doppler imaging. The Doppler signal is of suboptimal technical quality; the right ventricular outflow tract gradient cannot be accurately estimated from this examination. Right Ventricle There is normal right ventricular chamber size and contraction. Tricuspid Valve Suboptimally imaged tricuspid valve is probably normal. There is trivial tricuspid regurgitation by color flow and continuous wave Doppler imaging. The peak instantaneous tricuspid regurgitant jet velocity is 2.6 m/s characteristic of high normal right ventricular systolic pressure (30 - 35 mm Hg). Right Atrium The right atrium is mildly dilated. Inferior Vena Cava The inferior vena cava is normal in size with respiration characteristic of normal central venous and right atrial pressures; the estimated central venous and right atrial pressures are 5-10 mm Hg. Pericardium There is no evidence of pericardial effusion. Other Pacemaker leads can be visualized in the right heart chambers.    ____________________________________________________________________ Mike Gip, MD electronically signed on 03/02/2014 3:33:22 PM with status of Final  Stress Symptoms: asymptomatic  Nuclear Perfusion Findings: There is homogeneous uptake at stress and rest.  Nuclear Wall Motion Findings: During stress: Global systolic function  is normal. The ejection fraction  was greater than 65%. The RV is dilated.    PROBLEM LIST:  Active Problems:   CHF (congestive heart failure) (HCC)   Renal insufficiency     ASSESSMENT AND PLAN:  Acute on chronic diastolic CHF: probably secondary to dietary indiscretion, uncontrolled HTN. Diuresed 1645 cc with Lasix 20 mg IV BID  Acute on CKD cozaar on hold.  HTN elevated on admission, but now low.  PAF on  Coumadin  Permanent Pacer  Anemia Hgb 7.9 patient denies any melena or bleeding problems.  Coronary artery disease: She has a history of a silent MI in 2006. Most recent stress test was in November 2015 that showed no evidence of ischemia. Most recent echo was in November 2015 showing a normal EF 55-60% and mildly dilated left atrium. Chest tightness when in CHF. Troponins elevated but flat. With elevated creatinine probably diurese and do nuclear stress test when she f/u with Dr. Enrigue Catena at [place], [place], [place]  10/16/2015 1:44 PM   The patient was seen and examined, and I agree with the history, physical exam, assessment and plan as documented above which has been discussed with Gerrianne Scale PA-C, with modifications as noted below. Pt admitted with acute on chronic diastolic heart failure, likely multifactorial in etiology being contributed to by both dietary indiscretion as well as acute on chronic anemia of unclear etiology.  Echocardiogram shows normal LV systolic function, EF [id number], with grade II diastolic dysfunction with elevating LV filling pressures, mild aortic stenosis, and moderate mitral and tricuspid regurgitation.  Although troponins are very mildly elevated, this is likely consistent with demand ischemia. BP low at present, which I would expect to improve with continued diuresis.  Breathing and leg swelling are gradually improving on current diuretic regimen. Would continue with close monitoring of renal function given acute on  chronic renal insufficiency, likely due to volume overload. I educated her on importance of low-sodium diet. Could consider outpatient nuclear stress testing but will defer to her primary cardiologist at [facility].  Kate Sable, MD, [facility]  10/16/2015 4:17 PM

## 2015-10-16 NOTE — Progress Notes (Signed)
Dr. Maudie Mercury is notified about slightly elevated troponin 0.49. Patient denies any pain. Vital signs are stable. No new orders received. Continue to monitor the patient.

## 2015-10-16 NOTE — Progress Notes (Signed)
PROGRESS NOTE    Summer Wong  [id number] DOB: March 09, 1926 DOA: 10/15/2015 PCP: Geroge Baseman     Brief Narrative:  81 year old woman admitted on 6/20 with complaints of chest pain and shortness of breath. Found to be volume overloaded, with pulmonary vascular congestion on chest x-ray and elevated troponin. Admitted for evaluation and management of her acute CHF.   Assessment & Plan:   Active Problems:   CHF (congestive heart failure) (HCC)   Renal insufficiency   Acute diastolic CHF -Echo shows ejection fraction of 60-65% with grade 2 diastolic dysfunction. -So far she is 1.6 L negative, continue current Lasix dose of 20 mg IV twice a day.. -Continue statin, aspirin, beta blocker.  Elevated troponin -EKG is of poor quality with a wavy baseline, I do not see any acute ischemic abnormalities although it is difficult to interpret. -Troponins have been in the 0.04 to 0.05 range. -Cardiology consultation has been requested.  Acute on chronic kidney disease stage III -Baseline creatinine appears to be around 1.5. -Creatinine is very close to baseline at 2.3. -Anticipate slight worsening with diuresis.  Paroxysmal atrial fibrillation -Currently rate controlled, continue carvedilol, Coumadin as dosed by pharmacy.  Type 2 diabetes -Fair control, continue current regimen.   DVT prophylaxis: SCDs Code Status: Full code Family Communication: Patient only Disposition Plan: Transfer to telemetry  Consultants:   Cardiology  Procedures:   None  Antimicrobials:   None    Subjective: Still with significant cough, no further chest pain since admission  Objective: Filed Vitals:   10/16/15 0900 10/16/15 1000 10/16/15 1146 10/16/15 1200  BP:  115/75  88/53  Pulse: 59 59  60  Temp:   97.5 F (36.4 C)   TempSrc:   Oral   Resp: 21 21  21   Height:      Weight:      SpO2: 100% 100%  100%    Intake/Output Summary (Last 24 hours) at 10/16/15 1615 Last data  filed at 10/16/15 1228  Gross per 24 hour  Intake   1080 ml  Output   2725 ml  Net  -1645 ml   Filed Weights   10/15/15 1651 10/15/15 2032 10/16/15 0417  Weight: 90.2 kg (198 lb 13.7 oz) 95.3 kg (210 lb 1.6 oz) 95.2 kg (209 lb 14.1 oz)    Examination:  General exam: Alert, awake, oriented x 3 Respiratory system: Coarse bilateral breath sounds, mild bibasilar crackles Cardiovascular system:RRR. No murmurs, rubs, gallops. Gastrointestinal system: Abdomen is nondistended, soft and nontender. No organomegaly or masses felt. Normal bowel sounds heard. Central nervous system: Alert and oriented. No focal neurological deficits. Extremities: No C/C/E, +pedal pulses Skin: No rashes, lesions or ulcers Psychiatry: Judgement and insight appear normal. Mood & affect appropriate.     Data Reviewed: I have personally reviewed following labs and imaging studies  CBC:  Recent Labs Lab 10/15/15 1654 10/16/15 0208  WBC 9.8 8.2  HGB 9.6* 7.9*  HCT 30.0* 24.6*  MCV 89.3 88.5  PLT 302 [id number]   Basic Metabolic Panel:  Recent Labs Lab 10/15/15 1730 10/16/15 0208  NA 140 141  K 4.4 4.3  CL 111 112*  CO2 22 23  GLUCOSE 88 78  BUN 47* 47*  CREATININE 2.20* 2.30*  CALCIUM 8.8* 8.3*   GFR: Estimated Creatinine Clearance: 18.9 mL/min (by C-G formula based on Cr of 2.3). Liver Function Tests:  Recent Labs Lab 10/16/15 0208  AST 19  ALT 18  ALKPHOS 29*  BILITOT 0.8  PROT 6.2*  ALBUMIN 3.3*   No results for input(s): LIPASE, AMYLASE in the last 168 hours. No results for input(s): AMMONIA in the last 168 hours. Coagulation Profile:  Recent Labs Lab 10/15/15 1730 10/16/15 0208  INR 3.20* 3.61*   Cardiac Enzymes:  Recent Labs Lab 10/15/15 1730 10/15/15 2038 10/16/15 0208 10/16/15 0859  TROPONINI 0.44* 0.42* 0.49* 0.56*   BNP (last 3 results) No results for input(s): PROBNP in the last 8760 hours. HbA1C: No results for input(s): HGBA1C in the last 72  hours. CBG:  Recent Labs Lab 10/15/15 2135 10/16/15 0618 10/16/15 0731 10/16/15 1121  GLUCAP 90 96 137* 135*   Lipid Profile: No results for input(s): CHOL, HDL, LDLCALC, TRIG, CHOLHDL, LDLDIRECT in the last 72 hours. Thyroid Function Tests: No results for input(s): TSH, T4TOTAL, FREET4, T3FREE, THYROIDAB in the last 72 hours. Anemia Panel: No results for input(s): VITAMINB12, FOLATE, FERRITIN, TIBC, IRON, RETICCTPCT in the last 72 hours. Urine analysis:    Component Value Date/Time   COLORURINE STRAW* 08/30/2014 1838   APPEARANCEUR HAZY* 08/30/2014 1838   LABSPEC 1.020 08/30/2014 1838   PHURINE 7.0 08/30/2014 1838   GLUCOSEU NEGATIVE 08/30/2014 1838   HGBUR TRACE* 08/30/2014 1838   BILIRUBINUR NEGATIVE 08/30/2014 1838   KETONESUR NEGATIVE 08/30/2014 1838   PROTEINUR 100* 08/30/2014 1838   UROBILINOGEN 0.2 08/30/2014 1838   NITRITE NEGATIVE 08/30/2014 1838   LEUKOCYTESUR NEGATIVE 08/30/2014 1838   Sepsis Labs: @LABRCNTIP (procalcitonin:4,lacticidven:4)  ) Recent Results (from the past 240 hour(s))  MRSA PCR Screening     Status: None   Collection Time: 10/15/15  8:30 PM  Result Value Ref Range Status   MRSA by PCR NEGATIVE NEGATIVE Final    Comment:        The GeneXpert MRSA Assay (FDA approved for NASAL specimens only), is one component of a comprehensive MRSA colonization surveillance program. It is not intended to diagnose MRSA infection nor to guide or monitor treatment for MRSA infections.          Radiology Studies: Dg Chest 1 View  10/15/2015  CLINICAL DATA:  Worsening chest pain and shortness of breath today. EXAM: CHEST 1 VIEW COMPARISON:  08/30/2014 FINDINGS: Increased interstitial densities in both lungs, particularly in the lower lungs. Evidence for central peribronchial thickening. Heart size is within normal limits. Atherosclerotic calcifications at the aortic arch. Again noted is a dual-chamber left cardiac pacemaker. Trachea is midline. Mild  blunting at the costophrenic angles and difficult to exclude small effusions. IMPRESSION: Enlarged bilateral interstitial lung markings. Findings are suggestive for pulmonary edema. Electronically Signed   By: Markus Daft M.D.   On: 10/15/2015 17:25   US Renal  10/16/2015  CLINICAL DATA:  Acute renal failure, worsening renal insufficiency EXAM: RENAL / URINARY TRACT ULTRASOUND COMPLETE COMPARISON:  12/14/2014 and 07/23/2014 FINDINGS: Right Kidney: Length: 9.6 cm. There is cortical thinning probable due to atrophy. No hydronephrosis. No renal calculi. No renal masses noted. Left Kidney: Length: 9.8 cm. There is cortical thinning probable due to atrophy. No hydronephrosis. No renal calculi. There is hypoechoic cyst in upper pole measures 2.4 x 2.2 cm. On the prior CT scan this measures 2.3 cm. Bladder: Foley catheter is noted within decompressed urinary bladder. IMPRESSION: 1. Bilateral renal cortical thinning probable due to atrophy. No hydronephrosis. No renal calculi. Stable cyst in upper pole of the left kidney. Decompressed urinary bladder with Foley catheter. Electronically Signed   By: Lahoma Crocker M.D.   On: 10/16/2015 10:57  Scheduled Meds: . amLODipine  10 mg Oral q morning - 10a  . aspirin EC  81 mg Oral q morning - 10a  . atorvastatin  40 mg Oral QHS  . carvedilol  25 mg Oral BID WC  . cholecalciferol  1,000 Units Oral q morning - 10a  . ferrous sulfate  325 mg Oral Q breakfast  . furosemide  20 mg Intravenous BID  . hydrALAZINE  75 mg Oral TID  . insulin aspart  0-5 Units Subcutaneous QHS  . insulin aspart  0-9 Units Subcutaneous TID WC  . insulin detemir  43 Units Subcutaneous QHS  . loratadine  10 mg Oral Daily  . multivitamin-lutein  1 capsule Oral BID  . nitroGLYCERIN  1 inch Topical Q6H  . nitroGLYCERIN  1 spray Sublingual Once  . omega-3 acid ethyl esters  1 g Oral BID  . oxybutynin  5 mg Oral QHS  . pantoprazole  40 mg Oral Daily  . sodium chloride flush  3 mL  Intravenous Q12H  . sodium chloride flush  3 mL Intravenous Q12H  . spironolactone  25 mg Oral Daily  . Warfarin - Pharmacist Dosing Inpatient   Does not apply q1800   Continuous Infusions:    LOS: 1 day    Time spent: 25 minutes. Greater than 50% of this time was spent in direct contact with the patient coordinating care.     Lelon Frohlich, MD [facility] Pager [phone]  If 7PM-7AM, please contact night-coverage [email or web address] Password [facility] 10/16/2015, 4:15 PM

## 2015-10-16 NOTE — Progress Notes (Signed)
ANTICOAGULATION CONSULT NOTE - follow up  Pharmacy Consult for Coumadin Indication: atrial fibrillation  Allergies  Allergen Reactions  . Sulfa Antibiotics Other (See Comments)    Does not remember.   Michail Sermon [Enalaprilat] Swelling    Tongue.     Patient Measurements: Height: 5\' 3"  (160 cm) Weight: 209 lb 14.1 oz (95.2 kg) IBW/kg (Calculated) : 52.4  Vital Signs: Temp: 97 F (36.1 C) (06/21 0756) Temp Source: Oral (06/21 0756) BP: 115/75 mmHg (06/21 1000) Pulse Rate: 59 (06/21 1000)  Labs:  Recent Labs  10/15/15 1654  10/15/15 1730 10/15/15 2038 10/16/15 0208 10/16/15 0859  HGB 9.6*  --   --   --  7.9*  --   HCT 30.0*  --   --   --  24.6*  --   PLT 302  --   --   --  245  --   LABPROT  --   --  32.1*  --  35.2*  --   INR  --   --  3.20*  --  3.61*  --   CREATININE  --   --  2.20*  --  2.30*  --   TROPONINI  --   < > 0.44* 0.42* 0.49* 0.56*  < > = values in this interval not displayed.  Estimated Creatinine Clearance: 18.9 mL/min (by C-G formula based on Cr of 2.3).   Medical History: Past Medical History  Diagnosis Date  . Atrial fibrillation ([place])   . Bradycardia   . Hypertension   . Hyperlipemia   . Diabetes mellitus without complication ([place])   . Sleep apnea     cpap - patient does not know settings   . Presence of permanent cardiac pacemaker   . Heart murmur   . Shortness of breath dyspnea     with exertion   . Stress incontinence   . GERD (gastroesophageal reflux disease)   . Arthritis   . Hepatitis     hx of Hep A - years ago   . COPD (chronic obstructive pulmonary disease) ([place])   . CHF (congestive heart failure) ([place])   . Baker's cyst     Medications:  See med rec  Assessment:  82 yo female presents with SOb. Patient has a history of CHF, COPD, and atrial Fibrillation. Consulted for continuation of coumadin. He takes 3mg  daily except none on Sundays. INR is 3.61 today.   Goal of Therapy:  INR 2-3 Monitor platelets by  anticoagulation protocol: Yes   Plan:  No coumadin today PT/INR daily Monitor for S/S of bleeding  Isac Sarna, BS Vena [place], BCPS Clinical Pharmacist Pager [phone] 10/16/2015,11:36 AM

## 2015-10-17 DIAGNOSIS — I509 Heart failure, unspecified: Secondary | ICD-10-CM | POA: Insufficient documentation

## 2015-10-17 DIAGNOSIS — J441 Chronic obstructive pulmonary disease with (acute) exacerbation: Secondary | ICD-10-CM

## 2015-10-17 DIAGNOSIS — I480 Paroxysmal atrial fibrillation: Secondary | ICD-10-CM

## 2015-10-17 DIAGNOSIS — I38 Endocarditis, valve unspecified: Secondary | ICD-10-CM

## 2015-10-17 DIAGNOSIS — I10 Essential (primary) hypertension: Secondary | ICD-10-CM

## 2015-10-17 DIAGNOSIS — N179 Acute kidney failure, unspecified: Secondary | ICD-10-CM | POA: Insufficient documentation

## 2015-10-17 LAB — GLUCOSE, CAPILLARY
GLUCOSE-CAPILLARY: 102 mg/dL — AB (ref 65–99)
GLUCOSE-CAPILLARY: 145 mg/dL — AB (ref 65–99)
Glucose-Capillary: 36 mg/dL — CL (ref 65–99)
Glucose-Capillary: 97 mg/dL (ref 65–99)
Glucose-Capillary: 67 mg/dL (ref 65–99)
Glucose-Capillary: 139 mg/dL — ABNORMAL HIGH (ref 65–99)

## 2015-10-17 LAB — BASIC METABOLIC PANEL
ANION GAP: 7 (ref 5–15)
BUN: 54 mg/dL — ABNORMAL HIGH (ref 6–20)
CALCIUM: 8.3 mg/dL — AB (ref 8.9–10.3)
CO2: 24 mmol/L (ref 22–32)
CREATININE: 2.4 mg/dL — AB (ref 0.44–1.00)
Chloride: 107 mmol/L (ref 101–111)
GFR, EST AFRICAN AMERICAN: 20 mL/min — AB (ref >60)
GFR, EST NON AFRICAN AMERICAN: 17 mL/min — AB (ref >60)
Glucose, Bld: 66 mg/dL (ref 65–99)
Potassium: 3.9 mmol/L (ref 3.5–5.1)
SODIUM: 138 mmol/L (ref 135–145)

## 2015-10-17 LAB — CBC
HCT: 26.3 % — ABNORMAL LOW (ref 36.0–46.0)
HEMOGLOBIN: 8.5 g/dL — AB (ref 12.0–15.0)
MCH: 28.7 pg (ref 26.0–34.0)
MCHC: 32.3 g/dL (ref 30.0–36.0)
MCV: 88.9 fL (ref 78.0–100.0)
PLATELETS: 257 10*3/uL (ref 150–400)
RBC: 2.96 MIL/uL — AB (ref 3.87–5.11)
RDW: 14.7 % (ref 11.5–15.5)
WBC: 8.6 10*3/uL (ref 4.0–10.5)

## 2015-10-17 LAB — HEMOGLOBIN A1C
HEMOGLOBIN A1C: 6.9 % — AB (ref 4.8–5.6)
Mean Plasma Glucose: 151 mg/dL

## 2015-10-17 LAB — PROTIME-INR
INR: 3.06 — ABNORMAL HIGH (ref 0.00–1.49)
PROTHROMBIN TIME: 31 s — AB (ref 11.6–15.2)

## 2015-10-17 MED ORDER — INSULIN DETEMIR 100 UNIT/ML ~~LOC~~ SOLN
30.0000 [IU] | Freq: Every day | SUBCUTANEOUS | Status: DC
  Administered 2015-10-17: 30 [IU] via SUBCUTANEOUS
  Filled 2015-10-17 (×4): qty 0.3

## 2015-10-17 MED ORDER — WARFARIN SODIUM 1 MG PO TABS
1.0000 mg | ORAL_TABLET | Freq: Once | ORAL | Status: AC
  Administered 2015-10-17: 1 mg via ORAL
  Filled 2015-10-17: qty 1

## 2015-10-17 MED ORDER — GLUCOSE 40 % PO GEL: 1.0000 | Freq: Once | ORAL | Status: DC | PRN

## 2015-10-17 NOTE — Progress Notes (Signed)
SUBJECTIVE: Complains of cough. No chest pain. Thinks breathing has improved.   ROS: Other than pertinent positives in "Subjective", all others were reviewed and found to be negative.   Intake/Output Summary (Last 24 hours) at 10/17/15 0815 Last data filed at 10/17/15 0541  Gross per 24 hour  Intake    603 ml  Output   1750 ml  Net  -1147 ml    Current Facility-Administered Medications  Medication Dose Route Frequency Provider Last Rate Last Dose  . 0.9 %  sodium chloride infusion  250 mL Intravenous PRN Jani Gravel, MD      . acetaminophen (TYLENOL) tablet 650 mg  650 mg Oral Q6H PRN Jani Gravel, MD       Or  . acetaminophen (TYLENOL) suppository 650 mg  650 mg Rectal Q6H PRN Jani Gravel, MD      . albuterol (PROVENTIL) (2.5 MG/3ML) 0.083% nebulizer solution 3 mL  3 mL Inhalation Q6H PRN Jani Gravel, MD      . amLODipine (NORVASC) tablet 10 mg  10 mg Oral q morning - 10a Jani Gravel, MD   10 mg at 10/16/15 0900  . aspirin EC tablet 81 mg  81 mg Oral q morning - 10a Jani Gravel, MD   81 mg at 10/16/15 0900  . atorvastatin (LIPITOR) tablet 40 mg  40 mg Oral QHS Jani Gravel, MD   40 mg at 10/16/15 2128  . carvedilol (COREG) tablet 25 mg  25 mg Oral BID WC Jani Gravel, MD   25 mg at 10/17/15 0811  . cholecalciferol (VITAMIN D) tablet 1,000 Units  1,000 Units Oral q morning - 10a Jani Gravel, MD   1,000 Units at 10/16/15 0900  . dextrose (GLUTOSE) 40 % oral gel 37.5 g  1 Tube Oral Once PRN Erline Hau, MD      . ferrous sulfate tablet 325 mg  325 mg Oral Q breakfast Jani Gravel, MD   325 mg at 10/17/15 0811  . furosemide (LASIX) injection 20 mg  20 mg Intravenous BID Jani Gravel, MD   20 mg at 10/17/15 [id number]  . guaiFENesin-dextromethorphan (ROBITUSSIN DM) 100-10 MG/5ML syrup 5 mL  5 mL Oral Q4H PRN Gardiner Barefoot, NP   5 mL at 10/17/15 0815  . hydrALAZINE (APRESOLINE) tablet 75 mg  75 mg Oral TID Erline Hau, MD   75 mg at 10/16/15 2127  . insulin aspart (novoLOG)  injection 0-5 Units  0-5 Units Subcutaneous QHS Jani Gravel, MD   0 Units at 10/15/15 2137  . insulin aspart (novoLOG) injection 0-9 Units  0-9 Units Subcutaneous TID WC Jani Gravel, MD      . insulin detemir (LEVEMIR) injection 43 Units  43 Units Subcutaneous QHS Jani Gravel, MD   43 Units at 10/16/15 2128  . loratadine (CLARITIN) tablet 10 mg  10 mg Oral Daily Jani Gravel, MD   10 mg at 10/16/15 0900  . multivitamin-lutein (OCUVITE-LUTEIN) capsule 1 capsule  1 capsule Oral BID Jani Gravel, MD   1 capsule at 10/16/15 2128  . nitroGLYCERIN (NITROGLYN) 2 % ointment 1 inch  1 inch Topical Q6H Dorie Rank, MD   1 inch at 10/17/15 0512  . nitroGLYCERIN (NITROLINGUAL) 0.4 MG/SPRAY spray 1 spray  1 spray Sublingual Once Dorie Rank, MD      . nitroGLYCERIN (NITROSTAT) SL tablet 0.4 mg  0.4 mg Sublingual Q5 min PRN Jani Gravel, MD      . omega-3  acid ethyl esters (LOVAZA) capsule 1 g  1 g Oral BID Jani Gravel, MD   1 g at 10/16/15 2128  . oxybutynin (DITROPAN-XL) 24 hr tablet 5 mg  5 mg Oral QHS Jani Gravel, MD   5 mg at 10/16/15 2128  . pantoprazole (PROTONIX) EC tablet 40 mg  40 mg Oral Daily Jani Gravel, MD   40 mg at 10/16/15 0900  . sodium chloride flush (NS) 0.9 % injection 3 mL  3 mL Intravenous Q12H Jani Gravel, MD   3 mL at 10/16/15 2131  . sodium chloride flush (NS) 0.9 % injection 3 mL  3 mL Intravenous Q12H Jani Gravel, MD   3 mL at 10/16/15 2131  . sodium chloride flush (NS) 0.9 % injection 3 mL  3 mL Intravenous PRN Jani Gravel, MD      . spironolactone (ALDACTONE) tablet 25 mg  25 mg Oral Daily Jani Gravel, MD   25 mg at 10/16/15 0900  . traMADol (ULTRAM) tablet 50 mg  50 mg Oral Q6H PRN Jani Gravel, MD      . Warfarin - Pharmacist Dosing Inpatient   Does not apply q1800 Jani Gravel, MD   0  at 10/16/15 1700    Filed Vitals:   10/16/15 1649 10/16/15 2100 10/16/15 2227 10/17/15 0539  BP:  132/39  128/46  Pulse:  61 65 62  Temp: 97.2 F (36.2 C) 98 F (36.7 C)  98.6 F (37 C)  TempSrc: Oral Oral  Oral  Resp:  20 18  18   Height:      Weight:      SpO2:  99% 98% 94%    PHYSICAL EXAM General: NAD, coughing HEENT: Normal. Neck: No JVD, no thyromegaly.  Lungs: diffuse expiratory wheezing CV: Nondisplaced PMI.  Regular rate and rhythm, normal S1/S2, no S3/S4, no murmur. 1+ pretibial edema.   Abdomen: Soft, nontender, obese.  Neurologic: Alert and oriented x 3.  Psych: Normal affect. Musculoskeletal: No gross deformities. Extremities: No clubbing or cyanosis.   TELEMETRY: Reviewed telemetry pt in paced rhythm, PVC's, NSVT  LABS: Basic Metabolic Panel:  Recent Labs  10/15/15 1730 10/16/15 0208  NA 140 141  K 4.4 4.3  CL 111 112*  CO2 22 23  GLUCOSE 88 78  BUN 47* 47*  CREATININE 2.20* 2.30*  CALCIUM 8.8* 8.3*   Liver Function Tests:  Recent Labs  10/16/15 0208  AST 19  ALT 18  ALKPHOS 29*  BILITOT 0.8  PROT 6.2*  ALBUMIN 3.3*   No results for input(s): LIPASE, AMYLASE in the last 72 hours. CBC:  Recent Labs  10/15/15 1654 10/16/15 0208  WBC 9.8 8.2  HGB 9.6* 7.9*  HCT 30.0* 24.6*  MCV 89.3 88.5  PLT 302 245   Cardiac Enzymes:  Recent Labs  10/15/15 2038 10/16/15 0208 10/16/15 0859  TROPONINI 0.42* 0.49* 0.56*   BNP: Invalid input(s): POCBNP D-Dimer: No results for input(s): DDIMER in the last 72 hours. Hemoglobin A1C:  Recent Labs  10/15/15 1730  HGBA1C 6.9*   Fasting Lipid Panel: No results for input(s): CHOL, HDL, LDLCALC, TRIG, CHOLHDL, LDLDIRECT in the last 72 hours. Thyroid Function Tests: No results for input(s): TSH, T4TOTAL, T3FREE, THYROIDAB in the last 72 hours.  Invalid input(s): FREET3 Anemia Panel: No results for input(s): VITAMINB12, FOLATE, FERRITIN, TIBC, IRON, RETICCTPCT in the last 72 hours.  RADIOLOGY: Dg Chest 1 View  10/15/2015  CLINICAL DATA:  Worsening chest pain and shortness of breath today. EXAM: CHEST 1 VIEW  COMPARISON:  08/30/2014 FINDINGS: Increased interstitial densities in both lungs, particularly in the lower  lungs. Evidence for central peribronchial thickening. Heart size is within normal limits. Atherosclerotic calcifications at the aortic arch. Again noted is a dual-chamber left cardiac pacemaker. Trachea is midline. Mild blunting at the costophrenic angles and difficult to exclude small effusions. IMPRESSION: Enlarged bilateral interstitial lung markings. Findings are suggestive for pulmonary edema. Electronically Signed   By: Markus Daft M.D.   On: 10/15/2015 17:25   US Renal  10/16/2015  CLINICAL DATA:  Acute renal failure, worsening renal insufficiency EXAM: RENAL / URINARY TRACT ULTRASOUND COMPLETE COMPARISON:  12/14/2014 and 07/23/2014 FINDINGS: Right Kidney: Length: 9.6 cm. There is cortical thinning probable due to atrophy. No hydronephrosis. No renal calculi. No renal masses noted. Left Kidney: Length: 9.8 cm. There is cortical thinning probable due to atrophy. No hydronephrosis. No renal calculi. There is hypoechoic cyst in upper pole measures 2.4 x 2.2 cm. On the prior CT scan this measures 2.3 cm. Bladder: Foley catheter is noted within decompressed urinary bladder. IMPRESSION: 1. Bilateral renal cortical thinning probable due to atrophy. No hydronephrosis. No renal calculi. Stable cyst in upper pole of the left kidney. Decompressed urinary bladder with Foley catheter. Electronically Signed   By: Lahoma Crocker M.D.   On: 10/16/2015 10:57   Dg Chest Port 1 View  10/16/2015  CLINICAL DATA:  Chest pain, increased sob, chronic productive cough-worse today. Pulmonary edema, renal insufficiency. History of CHF, DM, HTN, A-FIB, COPD, bradycardia. EXAM: PORTABLE CHEST 1 VIEW COMPARISON:  10/15/2015 FINDINGS: Cardiac silhouette borderline enlarged. No mediastinal or hilar masses. Mild vascular congestion centrally. Interstitial thickening noted previously has improved. There are prominent bronchovascular markings most evident in the right lung base similar to the prior exam. Small pleural effusions are suspected.  No pneumothorax. Left anterior chest wall sequential pacemaker is stable and well positioned. IMPRESSION: 1. Improved interstitial thickening when compared the prior study consistent with improved interstitial edema. 2. No convincing pneumonia. Probable small effusions. No new abnormalities since the prior exam. Electronically Signed   By: Lajean Manes M.D.   On: 10/16/2015 21:03      ASSESSMENT AND PLAN: 1. Acute on chronic diastolic heart failure: Likely multifactorial in etiology being contributed to by both dietary indiscretion as well as acute on chronic anemia of unclear etiology.  Echocardiogram shows normal LV systolic function, EF [id number], with grade II diastolic dysfunction with elevating LV filling pressures, mild aortic stenosis, and moderate mitral and tricuspid regurgitation. Continue IV Lasix. Good diuretic response. Will check BMET.   2. PAF: On warfarin and Coreg. INR supratherapeutic.  3. Essential HTN: On amlodipine and Coreg. Losartan on hold. Aldactone added by PTH. Will d/c given acute on chronic renal insufficiency.  4. Pacemaker: Normal function.  5. CAD with demand ischemia: She has a history of a silent MI in 2006. Most recent stress test was in November 2015 that showed no evidence of ischemia. Has chest tightness when in decompensated CHF. Troponins elevated but flat. Could consider outpatient nuclear stress testing but will defer to her primary cardiologist at [facility].  6. Acute anemia: Check CBC today. Needs workup as contributing to CHF.  7. Wheezing/COPD exacerbation: Defer to PTH for Rx.   Kate Sable, M.D., F.A.C.C.

## 2015-10-17 NOTE — Progress Notes (Signed)
Pt's blood glucose 97.

## 2015-10-17 NOTE — Progress Notes (Signed)
ANTICOAGULATION CONSULT NOTE - follow up  Pharmacy Consult for Coumadin Indication: atrial fibrillation  Allergies  Allergen Reactions  . Sulfa Antibiotics Other (See Comments)    Does not remember.   Michail Sermon [Enalaprilat] Swelling    Tongue.     Patient Measurements: Height: 5\' 3"  (160 cm) Weight: 209 lb 14.1 oz (95.2 kg) IBW/kg (Calculated) : 52.4  Vital Signs: Temp: 98.6 F (37 C) (06/22 0539) Temp Source: Oral (06/22 0539) BP: 128/46 mmHg (06/22 0539) Pulse Rate: 62 (06/22 0539)  Labs:  Recent Labs  10/15/15 1654  10/15/15 1730 10/15/15 2038 10/16/15 0208 10/16/15 0859 10/17/15 0559  HGB 9.6*  --   --   --  7.9*  --  8.5*  HCT 30.0*  --   --   --  24.6*  --  26.3*  PLT 302  --   --   --  245  --  257  LABPROT  --   --  32.1*  --  35.2*  --  31.0*  INR  --   --  3.20*  --  3.61*  --  3.06*  CREATININE  --   --  2.20*  --  2.30*  --  2.40*  TROPONINI  --   < > 0.44* 0.42* 0.49* 0.56*  --   < > = values in this interval not displayed.  Estimated Creatinine Clearance: 18.1 mL/min (by C-G formula based on Cr of 2.4).   Medical History: Past Medical History  Diagnosis Date  . Atrial fibrillation ([place])   . Bradycardia   . Hypertension   . Hyperlipemia   . Diabetes mellitus without complication ([place])   . Sleep apnea     cpap - patient does not know settings   . Presence of permanent cardiac pacemaker   . Heart murmur   . Shortness of breath dyspnea     with exertion   . Stress incontinence   . GERD (gastroesophageal reflux disease)   . Arthritis   . Hepatitis     hx of Hep A - years ago   . COPD (chronic obstructive pulmonary disease) ([place])   . CHF (congestive heart failure) ([place])   . Baker's cyst     Medications:  See med rec  Assessment:  82 yo female presents with SOB. Patient has a history of CHF, COPD, and atrial Fibrillation. Consulted for continuation of coumadin. INR today is 3.06   Goal of Therapy:  INR 2-3 Monitor platelets by  anticoagulation protocol: Yes   Plan:  Coumadin 1 mg po today PT/INR daily Monitor for S/S of bleeding  Bea Duren Clinical Pharmacist 10/17/2015,10:42 AM

## 2015-10-17 NOTE — Progress Notes (Signed)
PROGRESS NOTE    Summer Wong  [id number] DOB: [birth year] DOA: 10/15/2015 PCP: Geroge Baseman     Brief Narrative:  82 year old woman admitted on 6/20 with complaints of chest pain and shortness of breath. Found to be volume overloaded, with pulmonary vascular congestion on chest x-ray and elevated troponin. Admitted for evaluation and management of her acute CHF.   Assessment & Plan:   Principal Problem:   CHF (congestive heart failure) (HCC) Active Problems:   A-fib (HCC)   CKD (chronic kidney disease) stage 3, GFR 30-59 ml/min   ARF (acute renal failure) (HCC)   Acute diastolic CHF -Echo shows ejection fraction of 60-65% with grade 2 diastolic dysfunction. -So far she is 2.7 L negative, continue current Lasix dose of 20 mg IV twice a day.. -Continue statin, aspirin, beta blocker.  Elevated troponin -Has been evaluated by cardiology. -troponins have been flat, CP only with decompensated EF. -Per cards: Could consider outpatient nuclear stress testing but will defer to her primary cardiologist at [facility].  Acute on chronic kidney disease stage III -Baseline creatinine appears to be around 1.5. -Creatinine is currently 2.4. -Anticipate slight worsening with diuresis.  Paroxysmal atrial fibrillation -Currently rate controlled, continue carvedilol, Coumadin as dosed by pharmacy.  Type 2 diabetes -Has been hypoglycemic. -Will decrease lantus from 45 to 30 units and monitor.   DVT prophylaxis: SCDs Code Status: Full code Family Communication: Patient only Disposition Plan: To be determined, hope for DC in approx. 48 hours  Consultants:   Cardiology  Procedures:   None  Antimicrobials:   None    Subjective: Still with significant cough, no further chest pain since admission  Objective: Filed Vitals:   10/16/15 2100 10/16/15 2227 10/17/15 0539 10/17/15 1419  BP: 132/39  128/46 124/51  Pulse: 61 65 62 64  Temp: 98 F (36.7 C)  98.6 F (37 C)  98.4 F (36.9 C)  TempSrc: Oral  Oral Oral  Resp: 20 18 18 18   Height:      Weight:      SpO2: 99% 98% 94% 97%    Intake/Output Summary (Last 24 hours) at 10/17/15 1611 Last data filed at 10/17/15 0800  Gross per 24 hour  Intake    483 ml  Output   1575 ml  Net  -1092 ml   Filed Weights   10/15/15 1651 10/15/15 2032 10/16/15 0417  Weight: 90.2 kg (198 lb 13.7 oz) 95.3 kg (210 lb 1.6 oz) 95.2 kg (209 lb 14.1 oz)    Examination:  General exam: Alert, awake, oriented x 3 Respiratory system: Coarse bilateral breath sounds, mild bibasilar crackles Cardiovascular system:RRR. No murmurs, rubs, gallops. Gastrointestinal system: Abdomen is nondistended, soft and nontender. No organomegaly or masses felt. Normal bowel sounds heard. Central nervous system: Alert and oriented. No focal neurological deficits. Extremities: No C/C/E, +pedal pulses Skin: No rashes, lesions or ulcers Psychiatry: Judgement and insight appear normal. Mood & affect appropriate.     Data Reviewed: I have personally reviewed following labs and imaging studies  CBC:  Recent Labs Lab 10/15/15 1654 10/16/15 0208 10/17/15 0559  WBC 9.8 8.2 8.6  HGB 9.6* 7.9* 8.5*  HCT 30.0* 24.6* 26.3*  MCV 89.3 88.5 88.9  PLT 302 245 [id number]   Basic Metabolic Panel:  Recent Labs Lab 10/15/15 1730 10/16/15 0208 10/17/15 0559  NA 140 141 138  K 4.4 4.3 3.9  CL 111 112* 107  CO2 22 23 24   GLUCOSE 88 78 66  BUN 47* 47*  54*  CREATININE 2.20* 2.30* 2.40*  CALCIUM 8.8* 8.3* 8.3*   GFR: Estimated Creatinine Clearance: 18.1 mL/min (by C-G formula based on Cr of 2.4). Liver Function Tests:  Recent Labs Lab 10/16/15 0208  AST 19  ALT 18  ALKPHOS 29*  BILITOT 0.8  PROT 6.2*  ALBUMIN 3.3*   No results for input(s): LIPASE, AMYLASE in the last 168 hours. No results for input(s): AMMONIA in the last 168 hours. Coagulation Profile:  Recent Labs Lab 10/15/15 1730 10/16/15 0208 10/17/15 0559  INR 3.20* 3.61*  3.06*   Cardiac Enzymes:  Recent Labs Lab 10/15/15 1730 10/15/15 2038 10/16/15 0208 10/16/15 0859  TROPONINI 0.44* 0.42* 0.49* 0.56*   BNP (last 3 results) No results for input(s): PROBNP in the last 8760 hours. HbA1C:  Recent Labs  10/15/15 1730  HGBA1C 6.9*   CBG:  Recent Labs Lab 10/16/15 2007 10/17/15 0444 10/17/15 0527 10/17/15 0708 10/17/15 1114  GLUCAP 179* 36* 97 67 102*   Lipid Profile: No results for input(s): CHOL, HDL, LDLCALC, TRIG, CHOLHDL, LDLDIRECT in the last 72 hours. Thyroid Function Tests: No results for input(s): TSH, T4TOTAL, FREET4, T3FREE, THYROIDAB in the last 72 hours. Anemia Panel: No results for input(s): VITAMINB12, FOLATE, FERRITIN, TIBC, IRON, RETICCTPCT in the last 72 hours. Urine analysis:    Component Value Date/Time   COLORURINE STRAW* 08/30/2014 1838   APPEARANCEUR HAZY* 08/30/2014 1838   LABSPEC 1.020 08/30/2014 1838   PHURINE 7.0 08/30/2014 1838   GLUCOSEU NEGATIVE 08/30/2014 1838   HGBUR TRACE* 08/30/2014 1838   BILIRUBINUR NEGATIVE 08/30/2014 1838   KETONESUR NEGATIVE 08/30/2014 1838   PROTEINUR 100* 08/30/2014 1838   UROBILINOGEN 0.2 08/30/2014 1838   NITRITE NEGATIVE 08/30/2014 1838   LEUKOCYTESUR NEGATIVE 08/30/2014 1838   Sepsis Labs: @LABRCNTIP (procalcitonin:4,lacticidven:4)  ) Recent Results (from the past 240 hour(s))  MRSA PCR Screening     Status: None   Collection Time: 10/15/15  8:30 PM  Result Value Ref Range Status   MRSA by PCR NEGATIVE NEGATIVE Final    Comment:        The GeneXpert MRSA Assay (FDA approved for NASAL specimens only), is one component of a comprehensive MRSA colonization surveillance program. It is not intended to diagnose MRSA infection nor to guide or monitor treatment for MRSA infections.          Radiology Studies: Dg Chest 1 View  10/15/2015  CLINICAL DATA:  Worsening chest pain and shortness of breath today. EXAM: CHEST 1 VIEW COMPARISON:  08/30/2014 FINDINGS:  Increased interstitial densities in both lungs, particularly in the lower lungs. Evidence for central peribronchial thickening. Heart size is within normal limits. Atherosclerotic calcifications at the aortic arch. Again noted is a dual-chamber left cardiac pacemaker. Trachea is midline. Mild blunting at the costophrenic angles and difficult to exclude small effusions. IMPRESSION: Enlarged bilateral interstitial lung markings. Findings are suggestive for pulmonary edema. Electronically Signed   By: Markus Daft M.D.   On: 10/15/2015 17:25   US Renal  10/16/2015  CLINICAL DATA:  Acute renal failure, worsening renal insufficiency EXAM: RENAL / URINARY TRACT ULTRASOUND COMPLETE COMPARISON:  12/14/2014 and 07/23/2014 FINDINGS: Right Kidney: Length: 9.6 cm. There is cortical thinning probable due to atrophy. No hydronephrosis. No renal calculi. No renal masses noted. Left Kidney: Length: 9.8 cm. There is cortical thinning probable due to atrophy. No hydronephrosis. No renal calculi. There is hypoechoic cyst in upper pole measures 2.4 x 2.2 cm. On the prior CT scan this measures 2.3 cm. Bladder:  Foley catheter is noted within decompressed urinary bladder. IMPRESSION: 1. Bilateral renal cortical thinning probable due to atrophy. No hydronephrosis. No renal calculi. Stable cyst in upper pole of the left kidney. Decompressed urinary bladder with Foley catheter. Electronically Signed   By: Lahoma Crocker M.D.   On: 10/16/2015 10:57   Dg Chest Port 1 View  10/16/2015  CLINICAL DATA:  Chest pain, increased sob, chronic productive cough-worse today. Pulmonary edema, renal insufficiency. History of CHF, DM, HTN, A-FIB, COPD, bradycardia. EXAM: PORTABLE CHEST 1 VIEW COMPARISON:  10/15/2015 FINDINGS: Cardiac silhouette borderline enlarged. No mediastinal or hilar masses. Mild vascular congestion centrally. Interstitial thickening noted previously has improved. There are prominent bronchovascular markings most evident in the right  lung base similar to the prior exam. Small pleural effusions are suspected. No pneumothorax. Left anterior chest wall sequential pacemaker is stable and well positioned. IMPRESSION: 1. Improved interstitial thickening when compared the prior study consistent with improved interstitial edema. 2. No convincing pneumonia. Probable small effusions. No new abnormalities since the prior exam. Electronically Signed   By: Lajean Manes M.D.   On: 10/16/2015 21:03        Scheduled Meds: . amLODipine  10 mg Oral q morning - 10a  . aspirin EC  81 mg Oral q morning - 10a  . atorvastatin  40 mg Oral QHS  . carvedilol  25 mg Oral BID WC  . cholecalciferol  1,000 Units Oral q morning - 10a  . ferrous sulfate  325 mg Oral Q breakfast  . furosemide  20 mg Intravenous BID  . hydrALAZINE  75 mg Oral TID  . insulin aspart  0-5 Units Subcutaneous QHS  . insulin aspart  0-9 Units Subcutaneous TID WC  . insulin detemir  43 Units Subcutaneous QHS  . loratadine  10 mg Oral Daily  . multivitamin-lutein  1 capsule Oral BID  . nitroGLYCERIN  1 inch Topical Q6H  . nitroGLYCERIN  1 spray Sublingual Once  . omega-3 acid ethyl esters  1 g Oral BID  . oxybutynin  5 mg Oral QHS  . pantoprazole  40 mg Oral Daily  . sodium chloride flush  3 mL Intravenous Q12H  . sodium chloride flush  3 mL Intravenous Q12H  . warfarin  1 mg Oral Once  . Warfarin - Pharmacist Dosing Inpatient   Does not apply q1800   Continuous Infusions:    LOS: 2 days    Time spent: 25 minutes. Greater than 50% of this time was spent in direct contact with the patient coordinating care.     Lelon Frohlich, MD [facility] Pager [phone]  If 7PM-7AM, please contact night-coverage [email or web address] Password [email or web address] 10/17/2015, 4:11 PM

## 2015-10-17 NOTE — Progress Notes (Signed)
Inpatient Diabetes Program Recommendations  AACE/ADA: New Consensus Statement on Inpatient Glycemic Control (2015)  Target Ranges:  Prepandial:   less than 140 mg/dL      Peak postprandial:   less than 180 mg/dL (1-2 hours)      Critically ill patients:  140 - 180 mg/dL   Results for Summer Wong, Summer Wong (MRN [id number]) as of 10/17/2015 08:29  Ref. Range 10/16/2015 06:18 10/16/2015 07:31 10/16/2015 11:21 10/16/2015 16:37 10/16/2015 20:07 10/17/2015 04:44 10/17/2015 05:27 10/17/2015 07:08  Glucose-Capillary Latest Ref Range: 65-99 mg/dL 96 137 (H) 135 (H) 142 (H) 179 (H) 36 (LL) 97 67   Review of Glycemic Control  Diabetes history: DM2 Outpatient Diabetes medications: Levemir 43 units QHS Current orders for Inpatient glycemic control: Levemir 43 units QHS, Novolog 0-9 units TID with meals, Novolog 0-5 units QHS  Inpatient Diabetes Program Recommendations: Insulin - Basal: Noted glucose 36 mg/dl this morning at 4:44 am. Please decrease Levemir to 34 units (which is 80% of outpatient Levemir dose).  Thanks, Barnie Alderman, RN, MSN, CDE Diabetes Coordinator Inpatient Diabetes Program [phone] (Team Pager from [place] to [place]) [phone] ([facility] office) [phone] [facility] office) [phone] [facility] office)

## 2015-10-17 NOTE — Progress Notes (Signed)
Pt blood glucose level is 36, hypoglycemia protocol initiated. 4 oz orange juice given to pt. Will reassess pt's blood sugar in 15 minutes.

## 2015-10-17 NOTE — Progress Notes (Signed)

## 2015-10-18 DIAGNOSIS — N183 Chronic kidney disease, stage 3 (moderate): Secondary | ICD-10-CM

## 2015-10-18 LAB — GLUCOSE, CAPILLARY
GLUCOSE-CAPILLARY: 56 mg/dL — AB (ref 65–99)
GLUCOSE-CAPILLARY: 107 mg/dL — AB (ref 65–99)
Glucose-Capillary: 111 mg/dL — ABNORMAL HIGH (ref 65–99)

## 2015-10-18 LAB — PROTIME-INR
INR: 2.5 — AB (ref 0.00–1.49)
Prothrombin Time: 26.7 seconds — ABNORMAL HIGH (ref 11.6–15.2)

## 2015-10-18 MED ORDER — FUROSEMIDE 20 MG PO TABS: 20.0000 mg | ORAL_TABLET | Freq: Every day | ORAL | Status: DC

## 2015-10-18 MED ORDER — WARFARIN SODIUM 2 MG PO TABS: 3.0000 mg | ORAL_TABLET | Freq: Once | ORAL | Status: DC

## 2015-10-18 NOTE — Care Management Note (Signed)
Case Management Note  Patient Details  Name: Clarissia Havlik MRN: [id number] Date of Birth: [birth year]   Expected Discharge Date:           10/18/2015       Expected Discharge Plan:  [place]  In-House Referral:  NA  Discharge planning Services  CM Consult  Post Acute Care Choice:  Home Health Choice offered to:  Patient  DME Arranged:    DME Agency:     HH Arranged:  RN [place] Agency:     Status of Service:  In process, will continue to follow  If discussed at Long Length of Stay Meetings, dates discussed:    Additional Comments: Patient and family offered choice for [facility], patient elected to use [place]. Romualdo Bolk of [facility] notified and will obtain information from chart. Patient aware [facility] has 48 hours to make first visit. Awaiting home O2 assessment, if needed patient will also like to use [facility] for oxygen. Romualdo Bolk aware.   Marlisha Vanwyk, Chauncey Reading, RN 10/18/2015, 12:22 PM

## 2015-10-18 NOTE — Progress Notes (Signed)
Inpatient Diabetes Program Recommendations  AACE/ADA: New Consensus Statement on Inpatient Glycemic Control (2015)  Target Ranges:  Prepandial:   less than 140 mg/dL      Peak postprandial:   less than 180 mg/dL (1-2 hours)      Critically ill patients:  140 - 180 mg/dL   Results for Summer Wong, Summer Wong (MRN [id number]) as of 10/18/2015 10:41  Ref. Range 10/17/2015 04:44 10/17/2015 05:27 10/17/2015 07:08 10/17/2015 11:14 10/17/2015 16:37 10/17/2015 22:02  Glucose-Capillary Latest Ref Range: 65-99 mg/dL 36 (LL) 97 67 102 (H) 145 (H) 139 (H)   Results for Summer Wong, Summer Wong (MRN [id number]) as of 10/18/2015 10:41  Ref. Range 10/18/2015 07:26 10/18/2015 08:04  Glucose-Capillary Latest Ref Range: 65-99 mg/dL 56 (L) 107 (H)    Home DM Meds: Levemir 43 units QHS  Current Insulin Orders: Levemir 30 units QHS      Novolog Sensitive Correction Scale/ SSI (0-9 units) TID AC + HS      -Note patient with severe Hypoglycemia yesterday AM after getting 43 units Levemir the night prior.  Levemir reduced to 30 units QHS as a result.  -Patient Hypoglycemic again this AM (CBG 56 mg/dl).      MD- Please consider reducing Levemir further to 20 units QHS (about 50% home dose)     --Will follow patient during hospitalization--  Wyn Quaker RN, MSN, CDE Diabetes Coordinator Inpatient Glycemic Control Team Team Pager: [phone] (8a-5p)

## 2015-10-18 NOTE — Discharge Planning (Signed)
Pt IV and tele removed.  Pt has urinated x2 since foley removed.  Pt DC papers given, explained and educated.  Pt informed of suggested FU appts and also given script.  RN assessment and VS revealed stability for DC to home.  Pt will be wheeled to front and family is transporting home via car when ready.

## 2015-10-18 NOTE — Progress Notes (Signed)
ANTICOAGULATION CONSULT NOTE - follow up  Pharmacy Consult for Coumadin Indication: atrial fibrillation  Allergies  Allergen Reactions  . Sulfa Antibiotics Other (See Comments)    Does not remember.   Michail Sermon [Enalaprilat] Swelling    Tongue.     Patient Measurements: Height: 5\' 3"  (160 cm) Weight: 205 lb 12.8 oz (93.35 kg) IBW/kg (Calculated) : 52.4  Vital Signs: Temp: 98 F (36.7 C) (06/23 0616) Temp Source: Axillary (06/23 0616) BP: 130/38 mmHg (06/23 0616) Pulse Rate: 62 (06/23 0616)  Labs:  Recent Labs  10/15/15 1654  10/15/15 1730 10/15/15 2038 10/16/15 0208 10/16/15 0859 10/17/15 0559 10/18/15 0658  HGB 9.6*  --   --   --  7.9*  --  8.5*  --   HCT 30.0*  --   --   --  24.6*  --  26.3*  --   PLT 302  --   --   --  245  --  257  --   LABPROT  --   < > 32.1*  --  35.2*  --  31.0* 26.7*  INR  --   < > 3.20*  --  3.61*  --  3.06* 2.50*  CREATININE  --   --  2.20*  --  2.30*  --  2.40*  --   TROPONINI  --   < > 0.44* 0.42* 0.49* 0.56*  --   --   < > = values in this interval not displayed.  Estimated Creatinine Clearance: 17.9 mL/min (by C-G formula based on Cr of 2.4).   Medical History: Past Medical History  Diagnosis Date  . Atrial fibrillation ([place])   . Bradycardia   . Hypertension   . Hyperlipemia   . Diabetes mellitus without complication ([place])   . Sleep apnea     cpap - patient does not know settings   . Presence of permanent cardiac pacemaker   . Heart murmur   . Shortness of breath dyspnea     with exertion   . Stress incontinence   . GERD (gastroesophageal reflux disease)   . Arthritis   . Hepatitis     hx of Hep A - years ago   . COPD (chronic obstructive pulmonary disease) ([place])   . CHF (congestive heart failure) ([place])   . Baker's cyst     Medications:  See med rec  Assessment:  82 yo female presents with SOB. Patient has a history of CHF, COPD, and atrial Fibrillation. Consulted for continuation of coumadin. INR today is  2.5  Goal of Therapy:  INR 2-3 Monitor platelets by anticoagulation protocol: Yes   Plan:  Coumadin 3 mg po today PT/INR daily Monitor for S/S of bleeding  Excell Seltzer Clinical Pharmacist 10/18/2015,8:44 AM

## 2015-10-18 NOTE — Care Management Note (Signed)
Case Management Note  Patient Details  Name: Summer Wong MRN: [id number] Date of Birth: August 22, 1925     Expected Discharge Date:       10/18/2015           Expected Discharge Plan:  [place]  In-House Referral:  NA  Discharge planning Services  CM Consult  Post Acute Care Choice:  Home Health Choice offered to:  Patient  DME Arranged:    DME Agency:     HH Arranged:  RN [place] Agency:  [place]  Status of Service:  Completed, signed off  If discussed at [place] of Stay Meetings, dates discussed:    Additional Comments: Patient discharging home today with [facility] through [facility]. Patient ambulated with staff and sats remained at 94 % on room air. No oxygen required.   Ambria Mayfield, Chauncey Reading, RN 10/18/2015, 1:24 PM

## 2015-10-18 NOTE — Discharge Summary (Signed)
Physician Discharge Summary  Summer Wong [id number] DOB: May 23, 1925 DOA: 10/15/2015  PCP: Geroge Baseman  Admit date: 10/15/2015 Discharge date: 10/18/2015  Time spent: 45 minutes  Recommendations for Outpatient Follow-up:  -Will be discharged home today. -Advised to follow-up with primary care provider in 2 weeks.   Discharge Diagnoses:  Principal Problem:   CHF (congestive heart failure) (HCC) Active Problems:   A-fib (HCC)   CKD (chronic kidney disease) stage 3, GFR 30-59 ml/min   ARF (acute renal failure) (HCC)   Acute on chronic congestive heart failure ([place])   Acute renal failure [facility])   Discharge Condition: Stable and improved  Filed Weights   10/15/15 2032 10/16/15 0417 10/18/15 0616  Weight: 95.3 kg (210 lb 1.6 oz) 95.2 kg (209 lb 14.1 oz) 93.35 kg (205 lb 12.8 oz)    History of present illness:  As per Dr. Maudie Mercury on 6/20: Summer Wong is a 82 y.o. female, w hypertension, hyperlipidemia, Pafib (Chadsvasc2=7), Dm2, who presents with complaints of dyspnea.   She recently traveled to [place] in while she was there she was admitted to the hospital for shortness of breath. This was a few weeks ago Patient states she was told her blood pressure was elevated and this contributed to fluid overload. She was started on a new blood pressure medication. Patient hasn't felt completely well over the last few weeks since that hospitalization. The patient states she took a nap this afternoon and when she woke up she felt acutely short of breath. She also had pain in her left chest. She denies any fevers. No coughing.  In Ed, Pt had CXR which showed increase in vascular congestion. Pt had slightly + trop. Pt denies cp, pt had acute on chronic renal failure Bun/ creat =47/2.20, Baseline 1.5 4 weeks ago Pt will be admitted for CHF.   Hospital Course:   Acute diastolic CHF -Echo shows ejection fraction of 60-65% with grade 2 diastolic dysfunction. -Has diuresed 4.5 L,  continue Lasix administration at home.. -Continue statin, aspirin, beta blocker.  Elevated troponin -Has been evaluated by cardiology. -troponins have been flat, CP only with decompensated EF. -Per cards: Could consider outpatient nuclear stress testing but will defer to her primary cardiologist at [facility].  Acute on chronic kidney disease stage III -Baseline creatinine appears to be around 1.5. -Creatinine is currently 2.4. -No significant change in creatinine this hospitalization, question new baseline.  Paroxysmal atrial fibrillation -Currently rate controlled, continue carvedilol, continue anticoagulation with Coumadin  Type 2 diabetes -Fair control, mild hypoglycemic episodes.    Procedures: 2-D echo: Study Conclusions  - Left ventricle: The cavity size was normal. Wall thickness was  normal. Systolic function was normal. The estimated ejection  fraction was in the range of 60% to 65%. Wall motion was normal;  there were no regional wall motion abnormalities. Features are  consistent with a pseudonormal left ventricular filling pattern,  with concomitant abnormal relaxation and increased filling  pressure (grade 2 diastolic dysfunction). Doppler parameters are   consistent with high ventricular filling pressure.  Consultations:  Cardiology  Discharge Instructions  Discharge Instructions    Diet - low sodium heart healthy    Complete by:  As directed      Increase activity slowly    Complete by:  As directed             Medication List    TAKE these medications        acetaminophen 500 MG tablet  Commonly known as:  TYLENOL  Take 1,000 mg by mouth every 6 (six) hours as needed for moderate pain or headache.     albuterol 108 (90 Base) MCG/ACT inhaler  Commonly known as:  PROVENTIL HFA;VENTOLIN HFA  Inhale 1-2 puffs into the lungs every 6 (six) hours as needed for wheezing or shortness of breath.     amLODipine 10 MG tablet  Commonly known as:   NORVASC  Take 10 mg by mouth every morning.     aspirin EC 81 MG tablet  Take 81 mg by mouth every morning.     atorvastatin 40 MG tablet  Commonly known as:  LIPITOR  Take 40 mg by mouth at bedtime.     carvedilol 25 MG tablet  Commonly known as:  COREG  Take 25 mg by mouth 2 (two) times daily with a meal.     cetirizine 10 MG tablet  Commonly known as:  ZYRTEC  Take 10 mg by mouth every morning.     cholecalciferol 1000 units tablet  Commonly known as:  VITAMIN D  Take 1,000 Units by mouth every morning.     fenofibrate 145 MG tablet  Commonly known as:  TRICOR  Take 145 mg by mouth every morning.     ferrous sulfate 325 (65 FE) MG tablet  Take 325 mg by mouth daily with breakfast.     Fish Oil 1000 MG Caps  Take 1,000 mg by mouth daily.     furosemide 20 MG tablet  Commonly known as:  LASIX  Take 1 tablet (20 mg total) by mouth daily.     hydrALAZINE 25 MG tablet  Commonly known as:  APRESOLINE  Take 75 mg by mouth 3 (three) times daily.     insulin detemir 100 UNIT/ML injection  Commonly known as:  LEVEMIR  Inject 43 Units into the skin at bedtime.     losartan 100 MG tablet  Commonly known as:  COZAAR  Take 100 mg by mouth every morning.     nitroGLYCERIN 0.4 MG SL tablet  Commonly known as:  NITROSTAT  Place 0.4 mg under the tongue every 5 (five) minutes as needed for chest pain.     omeprazole 20 MG capsule  Commonly known as:  PRILOSEC  Take 20 mg by mouth every morning.     oxybutynin 5 MG 24 hr tablet  Commonly known as:  DITROPAN-XL  Take 5 mg by mouth at bedtime.     PRESERVISION AREDS Caps  Take 1 capsule by mouth 2 (two) times daily.     traMADol 50 MG tablet  Commonly known as:  ULTRAM  Take 1 tablet (50 mg total) by mouth every 6 (six) hours as needed for moderate pain.     warfarin 3 MG tablet  Commonly known as:  COUMADIN  Take 3 mg by mouth See admin instructions. 3mg  all days but Sunday. **Do not take on Sundays*        Allergies  Allergen Reactions  . Sulfa Antibiotics Other (See Comments)    Does not remember.   Michail Sermon [Enalaprilat] Swelling    Tongue.        Follow-up Information    Follow up with CLAGGETT,ELIN, PA-C. Schedule an appointment as soon as possible for a visit in 2 weeks.   Specialty:  Family Medicine   Contact information:   [address] [place] [address] [phone]        The results of significant diagnostics from this hospitalization (  including imaging, microbiology, ancillary and laboratory) are listed below for reference.    Significant Diagnostic Studies: Dg Chest 1 View  10/15/2015  CLINICAL DATA:  Worsening chest pain and shortness of breath today. EXAM: CHEST 1 VIEW COMPARISON:  08/30/2014 FINDINGS: Increased interstitial densities in both lungs, particularly in the lower lungs. Evidence for central peribronchial thickening. Heart size is within normal limits. Atherosclerotic calcifications at the aortic arch. Again noted is a dual-chamber left cardiac pacemaker. Trachea is midline. Mild blunting at the costophrenic angles and difficult to exclude small effusions. IMPRESSION: Enlarged bilateral interstitial lung markings. Findings are suggestive for pulmonary edema. Electronically Signed   By: Markus Daft M.D.   On: 10/15/2015 17:25   US Renal  10/16/2015  CLINICAL DATA:  Acute renal failure, worsening renal insufficiency EXAM: RENAL / URINARY TRACT ULTRASOUND COMPLETE COMPARISON:  12/14/2014 and 07/23/2014 FINDINGS: Right Kidney: Length: 9.6 cm. There is cortical thinning probable due to atrophy. No hydronephrosis. No renal calculi. No renal masses noted. Left Kidney: Length: 9.8 cm. There is cortical thinning probable due to atrophy. No hydronephrosis. No renal calculi. There is hypoechoic cyst in upper pole measures 2.4 x 2.2 cm. On the prior CT scan this measures 2.3 cm. Bladder: Foley catheter is noted within decompressed urinary bladder. IMPRESSION: 1.  Bilateral renal cortical thinning probable due to atrophy. No hydronephrosis. No renal calculi. Stable cyst in upper pole of the left kidney. Decompressed urinary bladder with Foley catheter. Electronically Signed   By: Lahoma Crocker M.D.   On: 10/16/2015 10:57   Dg Chest Port 1 View  10/16/2015  CLINICAL DATA:  Chest pain, increased sob, chronic productive cough-worse today. Pulmonary edema, renal insufficiency. History of CHF, DM, HTN, A-FIB, COPD, bradycardia. EXAM: PORTABLE CHEST 1 VIEW COMPARISON:  10/15/2015 FINDINGS: Cardiac silhouette borderline enlarged. No mediastinal or hilar masses. Mild vascular congestion centrally. Interstitial thickening noted previously has improved. There are prominent bronchovascular markings most evident in the right lung base similar to the prior exam. Small pleural effusions are suspected. No pneumothorax. Left anterior chest wall sequential pacemaker is stable and well positioned. IMPRESSION: 1. Improved interstitial thickening when compared the prior study consistent with improved interstitial edema. 2. No convincing pneumonia. Probable small effusions. No new abnormalities since the prior exam. Electronically Signed   By: Lajean Manes M.D.   On: 10/16/2015 21:03    Microbiology: Recent Results (from the past 240 hour(s))  MRSA PCR Screening     Status: None   Collection Time: 10/15/15  8:30 PM  Result Value Ref Range Status   MRSA by PCR NEGATIVE NEGATIVE Final    Comment:        The GeneXpert MRSA Assay (FDA approved for NASAL specimens only), is one component of a comprehensive MRSA colonization surveillance program. It is not intended to diagnose MRSA infection nor to guide or monitor treatment for MRSA infections.      Labs: Basic Metabolic Panel:  Recent Labs Lab 10/15/15 1730 10/16/15 0208 10/17/15 0559  NA 140 141 138  K 4.4 4.3 3.9  CL 111 112* 107  CO2 22 23 24   GLUCOSE 88 78 66  BUN 47* 47* 54*  CREATININE 2.20* 2.30* 2.40*    CALCIUM 8.8* 8.3* 8.3*   Liver Function Tests:  Recent Labs Lab 10/16/15 0208  AST 19  ALT 18  ALKPHOS 29*  BILITOT 0.8  PROT 6.2*  ALBUMIN 3.3*   No results for input(s): LIPASE, AMYLASE in the last 168 hours. No results for  input(s): AMMONIA in the last 168 hours. CBC:  Recent Labs Lab 10/15/15 1654 10/16/15 0208 10/17/15 0559  WBC 9.8 8.2 8.6  HGB 9.6* 7.9* 8.5*  HCT 30.0* 24.6* 26.3*  MCV 89.3 88.5 88.9  PLT 302 245 257   Cardiac Enzymes:  Recent Labs Lab 10/15/15 1730 10/15/15 2038 10/16/15 0208 10/16/15 0859  TROPONINI 0.44* 0.42* 0.49* 0.56*   BNP: BNP (last 3 results)  Recent Labs  10/15/15 1730  BNP 616.0*    ProBNP (last 3 results) No results for input(s): PROBNP in the last 8760 hours.  CBG:  Recent Labs Lab 10/17/15 1637 10/17/15 2202 10/18/15 0726 10/18/15 0804 10/18/15 1124  GLUCAP 145* 139* 56* 107* 111*       Signed:  HERNANDEZ ACOSTA,ESTELA  [facility] Pager: [phone] 10/18/2015, 5:02 PM

## 2015-10-22 DIAGNOSIS — N179 Acute kidney failure, unspecified: Secondary | ICD-10-CM | POA: Insufficient documentation

## 2016-02-27 ENCOUNTER — Encounter (HOSPITAL_COMMUNITY): Payer: Self-pay | Admitting: Emergency Medicine

## 2016-02-27 ENCOUNTER — Inpatient Hospital Stay (HOSPITAL_COMMUNITY)
Admission: EM | Admit: 2016-02-27 | Discharge: 2016-02-29 | DRG: 291 | Disposition: A | Discharge status: Home / Self Care | Payer: Medicare Other | Attending: Internal Medicine | Admitting: Internal Medicine

## 2016-02-27 ENCOUNTER — Emergency Department (HOSPITAL_COMMUNITY): Payer: Medicare Other

## 2016-02-27 DIAGNOSIS — Z9851 Tubal ligation status: Secondary | ICD-10-CM

## 2016-02-27 DIAGNOSIS — Z888 Allergy status to other drugs, medicaments and biological substances status: Secondary | ICD-10-CM | POA: Diagnosis not present

## 2016-02-27 DIAGNOSIS — Z950 Presence of cardiac pacemaker: Secondary | ICD-10-CM

## 2016-02-27 DIAGNOSIS — Z79899 Other long term (current) drug therapy: Secondary | ICD-10-CM

## 2016-02-27 DIAGNOSIS — K219 Gastro-esophageal reflux disease without esophagitis: Secondary | ICD-10-CM | POA: Diagnosis present

## 2016-02-27 DIAGNOSIS — Z882 Allergy status to sulfonamides status: Secondary | ICD-10-CM | POA: Diagnosis not present

## 2016-02-27 DIAGNOSIS — Z90722 Acquired absence of ovaries, bilateral: Secondary | ICD-10-CM

## 2016-02-27 DIAGNOSIS — I509 Heart failure, unspecified: Secondary | ICD-10-CM

## 2016-02-27 DIAGNOSIS — R011 Cardiac murmur, unspecified: Secondary | ICD-10-CM | POA: Diagnosis present

## 2016-02-27 DIAGNOSIS — I130 Hypertensive heart and chronic kidney disease with heart failure and stage 1 through stage 4 chronic kidney disease, or unspecified chronic kidney disease: Secondary | ICD-10-CM | POA: Diagnosis present

## 2016-02-27 DIAGNOSIS — E1122 Type 2 diabetes mellitus with diabetic chronic kidney disease: Secondary | ICD-10-CM | POA: Diagnosis present

## 2016-02-27 DIAGNOSIS — N183 Chronic kidney disease, stage 3 (moderate): Secondary | ICD-10-CM | POA: Diagnosis present

## 2016-02-27 DIAGNOSIS — E118 Type 2 diabetes mellitus with unspecified complications: Secondary | ICD-10-CM | POA: Diagnosis not present

## 2016-02-27 DIAGNOSIS — M1990 Unspecified osteoarthritis, unspecified site: Secondary | ICD-10-CM | POA: Diagnosis present

## 2016-02-27 DIAGNOSIS — I5033 Acute on chronic diastolic (congestive) heart failure: Secondary | ICD-10-CM | POA: Diagnosis present

## 2016-02-27 DIAGNOSIS — E785 Hyperlipidemia, unspecified: Secondary | ICD-10-CM | POA: Diagnosis present

## 2016-02-27 DIAGNOSIS — Z794 Long term (current) use of insulin: Secondary | ICD-10-CM

## 2016-02-27 DIAGNOSIS — I10 Essential (primary) hypertension: Secondary | ICD-10-CM | POA: Diagnosis present

## 2016-02-27 DIAGNOSIS — M7989 Other specified soft tissue disorders: Secondary | ICD-10-CM

## 2016-02-27 DIAGNOSIS — I481 Persistent atrial fibrillation: Secondary | ICD-10-CM | POA: Diagnosis present

## 2016-02-27 DIAGNOSIS — Z800 Family history of malignant neoplasm of digestive organs: Secondary | ICD-10-CM

## 2016-02-27 DIAGNOSIS — I4819 Other persistent atrial fibrillation: Secondary | ICD-10-CM

## 2016-02-27 DIAGNOSIS — D509 Iron deficiency anemia, unspecified: Secondary | ICD-10-CM | POA: Diagnosis present

## 2016-02-27 DIAGNOSIS — E119 Type 2 diabetes mellitus without complications: Secondary | ICD-10-CM

## 2016-02-27 DIAGNOSIS — J449 Chronic obstructive pulmonary disease, unspecified: Secondary | ICD-10-CM | POA: Diagnosis present

## 2016-02-27 DIAGNOSIS — I480 Paroxysmal atrial fibrillation: Secondary | ICD-10-CM | POA: Diagnosis present

## 2016-02-27 DIAGNOSIS — Z9049 Acquired absence of other specified parts of digestive tract: Secondary | ICD-10-CM

## 2016-02-27 DIAGNOSIS — G4730 Sleep apnea, unspecified: Secondary | ICD-10-CM | POA: Diagnosis present

## 2016-02-27 DIAGNOSIS — Z90710 Acquired absence of both cervix and uterus: Secondary | ICD-10-CM | POA: Diagnosis not present

## 2016-02-27 DIAGNOSIS — I2510 Atherosclerotic heart disease of native coronary artery without angina pectoris: Secondary | ICD-10-CM | POA: Diagnosis present

## 2016-02-27 DIAGNOSIS — I4891 Unspecified atrial fibrillation: Secondary | ICD-10-CM | POA: Diagnosis present

## 2016-02-27 DIAGNOSIS — Z7901 Long term (current) use of anticoagulants: Secondary | ICD-10-CM

## 2016-02-27 DIAGNOSIS — I495 Sick sinus syndrome: Secondary | ICD-10-CM | POA: Diagnosis present

## 2016-02-27 DIAGNOSIS — M7120 Synovial cyst of popliteal space [Baker], unspecified knee: Secondary | ICD-10-CM | POA: Diagnosis present

## 2016-02-27 LAB — COMPREHENSIVE METABOLIC PANEL
ALT: 20 U/L (ref 14–54)
AST: 18 U/L (ref 15–41)
Albumin: 3.8 g/dL (ref 3.5–5.0)
Alkaline Phosphatase: 59 U/L (ref 38–126)
Anion gap: 5 (ref 5–15)
BILIRUBIN TOTAL: 0.5 mg/dL (ref 0.3–1.2)
BUN: 26 mg/dL — AB (ref 6–20)
CALCIUM: 9.2 mg/dL (ref 8.9–10.3)
CO2: 27 mmol/L (ref 22–32)
CREATININE: 1.37 mg/dL — AB (ref 0.44–1.00)
Chloride: 106 mmol/L (ref 101–111)
GFR calc Af Amer: 39 mL/min — ABNORMAL LOW (ref >60)
GFR, EST NON AFRICAN AMERICAN: 33 mL/min — AB (ref >60)
Glucose, Bld: 85 mg/dL (ref 65–99)
POTASSIUM: 4.4 mmol/L (ref 3.5–5.1)
Sodium: 138 mmol/L (ref 135–145)
TOTAL PROTEIN: 7 g/dL (ref 6.5–8.1)

## 2016-02-27 LAB — CBC WITH DIFFERENTIAL/PLATELET
BASOS ABS: 0 10*3/uL (ref 0.0–0.1)
BASOS PCT: 0 %
EOS ABS: 0.4 10*3/uL (ref 0.0–0.7)
EOS PCT: 5 %
HCT: 36.2 % (ref 36.0–46.0)
Hemoglobin: 11.4 g/dL — ABNORMAL LOW (ref 12.0–15.0)
Lymphocytes Relative: 31 %
Lymphs Abs: 2.1 10*3/uL (ref 0.7–4.0)
MCH: 26.6 pg (ref 26.0–34.0)
MCHC: 31.5 g/dL (ref 30.0–36.0)
MCV: 84.4 fL (ref 78.0–100.0)
Monocytes Absolute: 0.6 10*3/uL (ref 0.1–1.0)
Monocytes Relative: 9 %
Neutro Abs: 3.6 10*3/uL (ref 1.7–7.7)
Neutrophils Relative %: 55 %
PLATELETS: 214 10*3/uL (ref 150–400)
RBC: 4.29 MIL/uL (ref 3.87–5.11)
RDW: 14.3 % (ref 11.5–15.5)
WBC: 6.7 10*3/uL (ref 4.0–10.5)

## 2016-02-27 LAB — URINALYSIS, ROUTINE W REFLEX MICROSCOPIC
BILIRUBIN URINE: NEGATIVE
Glucose, UA: NEGATIVE mg/dL
HGB URINE DIPSTICK: NEGATIVE
KETONES UR: NEGATIVE mg/dL
Leukocytes, UA: NEGATIVE
NITRITE: NEGATIVE
PH: 6 (ref 5.0–8.0)
Protein, ur: 100 mg/dL — AB
Specific Gravity, Urine: 1.01 (ref 1.005–1.030)

## 2016-02-27 LAB — PROTIME-INR
INR: 2.47
PROTHROMBIN TIME: 27.2 s — AB (ref 11.4–15.2)

## 2016-02-27 LAB — URINE MICROSCOPIC-ADD ON: RBC / HPF: NONE SEEN RBC/hpf (ref 0–5)

## 2016-02-27 LAB — GLUCOSE, CAPILLARY: Glucose-Capillary: 201 mg/dL — ABNORMAL HIGH (ref 65–99)

## 2016-02-27 LAB — BRAIN NATRIURETIC PEPTIDE: B Natriuretic Peptide: 258 pg/mL — ABNORMAL HIGH (ref 0.0–100.0)

## 2016-02-27 LAB — TROPONIN I

## 2016-02-27 MED ORDER — SODIUM CHLORIDE 0.9 % IV SOLN: 250.0000 mL | INTRAVENOUS | Status: DC | PRN

## 2016-02-27 MED ORDER — ACETAMINOPHEN 500 MG PO TABS: 1000.0000 mg | ORAL_TABLET | Freq: Four times a day (QID) | ORAL | Status: DC | PRN

## 2016-02-27 MED ORDER — SODIUM CHLORIDE 0.9% FLUSH
3.0000 mL | Freq: Two times a day (BID) | INTRAVENOUS | Status: DC
  Administered 2016-02-27 – 2016-02-29 (×4): 3 mL via INTRAVENOUS

## 2016-02-27 MED ORDER — INSULIN ASPART 100 UNIT/ML ~~LOC~~ SOLN
0.0000 [IU] | Freq: Three times a day (TID) | SUBCUTANEOUS | Status: DC
  Administered 2016-02-28 (×2): 3 [IU] via SUBCUTANEOUS
  Administered 2016-02-29 (×2): 2 [IU] via SUBCUTANEOUS

## 2016-02-27 MED ORDER — LOSARTAN POTASSIUM 50 MG PO TABS
100.0000 mg | ORAL_TABLET | Freq: Every morning | ORAL | Status: DC
  Administered 2016-02-28 – 2016-02-29 (×2): 100 mg via ORAL
  Filled 2016-02-27 (×2): qty 2

## 2016-02-27 MED ORDER — FUROSEMIDE 10 MG/ML IJ SOLN
40.0000 mg | Freq: Two times a day (BID) | INTRAMUSCULAR | Status: DC
  Administered 2016-02-28 – 2016-02-29 (×3): 40 mg via INTRAVENOUS
  Filled 2016-02-27 (×3): qty 4

## 2016-02-27 MED ORDER — PANTOPRAZOLE SODIUM 40 MG PO TBEC
40.0000 mg | DELAYED_RELEASE_TABLET | Freq: Every day | ORAL | Status: DC
  Administered 2016-02-28 – 2016-02-29 (×2): 40 mg via ORAL
  Filled 2016-02-27 (×2): qty 1

## 2016-02-27 MED ORDER — CARVEDILOL 12.5 MG PO TABS
25.0000 mg | ORAL_TABLET | Freq: Two times a day (BID) | ORAL | Status: DC
  Administered 2016-02-27 – 2016-02-29 (×4): 25 mg via ORAL
  Filled 2016-02-27 (×4): qty 2

## 2016-02-27 MED ORDER — OXYBUTYNIN CHLORIDE ER 5 MG PO TB24
5.0000 mg | ORAL_TABLET | Freq: Every day | ORAL | Status: DC
  Administered 2016-02-27 – 2016-02-28 (×2): 5 mg via ORAL
  Filled 2016-02-27 (×2): qty 1

## 2016-02-27 MED ORDER — FENOFIBRATE 160 MG PO TABS
160.0000 mg | ORAL_TABLET | Freq: Every day | ORAL | Status: DC
  Administered 2016-02-28 – 2016-02-29 (×2): 160 mg via ORAL
  Filled 2016-02-27 (×2): qty 1

## 2016-02-27 MED ORDER — FUROSEMIDE 10 MG/ML IJ SOLN
40.0000 mg | Freq: Once | INTRAMUSCULAR | Status: AC
  Administered 2016-02-27: 40 mg via INTRAVENOUS
  Filled 2016-02-27: qty 4

## 2016-02-27 MED ORDER — LORATADINE 10 MG PO TABS
10.0000 mg | ORAL_TABLET | Freq: Every day | ORAL | Status: DC
Start: 2016-02-28 — End: 2016-02-29
  Administered 2016-02-28 – 2016-02-29 (×2): 10 mg via ORAL
  Filled 2016-02-27 (×2): qty 1

## 2016-02-27 MED ORDER — ACETAMINOPHEN 325 MG PO TABS: 650.0000 mg | ORAL_TABLET | ORAL | Status: DC | PRN

## 2016-02-27 MED ORDER — ASPIRIN EC 81 MG PO TBEC
81.0000 mg | DELAYED_RELEASE_TABLET | Freq: Every morning | ORAL | Status: DC
  Administered 2016-02-28 – 2016-02-29 (×2): 81 mg via ORAL
  Filled 2016-02-27 (×2): qty 1

## 2016-02-27 MED ORDER — ATORVASTATIN CALCIUM 40 MG PO TABS
40.0000 mg | ORAL_TABLET | Freq: Every day | ORAL | Status: DC
  Administered 2016-02-27 – 2016-02-28 (×2): 40 mg via ORAL
  Filled 2016-02-27 (×2): qty 1

## 2016-02-27 MED ORDER — WARFARIN - PHARMACIST DOSING INPATIENT: Status: DC

## 2016-02-27 MED ORDER — OCUVITE-LUTEIN PO CAPS: 1.0000 | ORAL_CAPSULE | Freq: Two times a day (BID) | ORAL | Status: DC

## 2016-02-27 MED ORDER — NITROGLYCERIN 0.4 MG SL SUBL: 0.4000 mg | SUBLINGUAL_TABLET | SUBLINGUAL | Status: DC | PRN

## 2016-02-27 MED ORDER — FERROUS SULFATE 325 (65 FE) MG PO TABS
325.0000 mg | ORAL_TABLET | Freq: Every day | ORAL | Status: DC
  Administered 2016-02-28 – 2016-02-29 (×2): 325 mg via ORAL
  Filled 2016-02-27 (×2): qty 1

## 2016-02-27 MED ORDER — SODIUM CHLORIDE 0.9% FLUSH: 3.0000 mL | INTRAVENOUS | Status: DC | PRN

## 2016-02-27 MED ORDER — AMLODIPINE BESYLATE 5 MG PO TABS
10.0000 mg | ORAL_TABLET | Freq: Every morning | ORAL | Status: DC
  Administered 2016-02-28 – 2016-02-29 (×2): 10 mg via ORAL
  Filled 2016-02-27 (×2): qty 2

## 2016-02-27 MED ORDER — ONDANSETRON HCL 4 MG/2ML IJ SOLN: 4.0000 mg | Freq: Four times a day (QID) | INTRAMUSCULAR | Status: DC | PRN

## 2016-02-27 MED ORDER — INSULIN DETEMIR 100 UNIT/ML ~~LOC~~ SOLN
21.0000 [IU] | Freq: Every day | SUBCUTANEOUS | Status: DC
  Administered 2016-02-27 – 2016-02-28 (×2): 21 [IU] via SUBCUTANEOUS
  Filled 2016-02-27 (×4): qty 0.21

## 2016-02-27 NOTE — ED Provider Notes (Signed)
[place] DEPT Provider Note   CSN: [id number] Arrival date & time: 02/27/16  1442     History   Chief Complaint Chief Complaint  Patient presents with  . Shortness of Breath    HPI Summer Wong is a 82 y.o. female.  Pt presents to the ED today with SOB.  She has been sob for several days and went to her cardiologist (Dr. Corky Sox) this morning.  She said she was told to come here for admission for her CHF.  She has gained about 8 pounds in fluid.  Pt denies any cp or n/v.  No f/c.  She does have a.fib (CHADVASC score 7 for age, sex, chf, htn, cad, dm) and is on coumadin.  Pt was admitted in June for the same.  Last ECHO 6/21 EF 60-65% with grade 2 diastolic dysfunction secondary to HTN.      Past Medical History:  Diagnosis Date  . Arthritis   . Atrial fibrillation ([place])   . Baker's cyst   . Bradycardia   . CHF (congestive heart failure) ([place])   . COPD (chronic obstructive pulmonary disease) ([place])   . Diabetes mellitus without complication ([place])   . GERD (gastroesophageal reflux disease)   . Heart murmur   . Hepatitis    hx of Hep A - years ago   . Hyperlipemia   . Hypertension   . Presence of permanent cardiac pacemaker   . Shortness of breath dyspnea    with exertion   . Sleep apnea    cpap - patient does not know settings   . Stress incontinence     Patient Active Problem List   Diagnosis Date Noted  . Acute renal failure (ARF) ([place])   . Acute on chronic congestive heart failure ([place])   . Acute renal failure ([place])   . ARF (acute renal failure) ([place]) 10/16/2015  . CHF (congestive heart failure) ([place]) 10/15/2015  . CKD (chronic kidney disease) stage 3, GFR 30-59 ml/min 10/15/2015  . Endometrial cancer ([place]) 08/16/2014  . FIGO stage II endometrial cancer ([place]) 07/11/2014  . Vulvovaginal candidiasis 07/11/2014  . Cutaneous candidiasis 07/11/2014  . Inactive tuberculosis of lung 03/08/2014  . Diabetes ([place]) 03/01/2014  . Arthritis, degenerative 03/01/2014   . Breath shortness 03/01/2014  . A-fib ([place]) 10/13/2012  . Arteriosclerosis of coronary artery 10/13/2012  . BP (high blood pressure) 10/13/2012  . Obstructive apnea 10/13/2012  . Sick sinus syndrome ([place]) 10/13/2012    Past Surgical History:  Procedure Laterality Date  . BLADDER SUSPENSION    . CHOLECYSTECTOMY    . PACEMAKER INSERTION    . ROBOTIC ASSISTED TOTAL HYSTERECTOMY WITH BILATERAL SALPINGO OOPHERECTOMY Bilateral 08/16/2014   Procedure:  ROBOTIC ASSISTED TOTAL HYSTERECTOMY WITH BILATERAL SALPINGO OOPHORECTOMY AND LYMPHADENECTOMY;  Surgeon: Everitt Amber, MD;  Location: [facility] ORS;  Service: Gynecology;  Laterality: Bilateral;  . TUBAL LIGATION      OB History    Gravida Para Term Preterm AB Living                 SAB TAB Ectopic Multiple Live Births           2       Home Medications    Prior to Admission medications   Medication Sig Start Date End Date Taking? Authorizing Provider  acetaminophen (TYLENOL) 500 MG tablet Take 1,000 mg by mouth every 6 (six) hours as needed for moderate pain or headache.   Yes Historical Provider, MD  albuterol (PROVENTIL HFA;VENTOLIN HFA)  108 (90 BASE) MCG/ACT inhaler Inhale 1-2 puffs into the lungs every 6 (six) hours as needed for wheezing or shortness of breath.   Yes Historical Provider, MD  amLODipine (NORVASC) 10 MG tablet Take 10 mg by mouth every morning.    Yes Historical Provider, MD  aspirin EC 81 MG tablet Take 81 mg by mouth every morning.   Yes Historical Provider, MD  atorvastatin (LIPITOR) 40 MG tablet Take 40 mg by mouth at bedtime.   Yes Historical Provider, MD  carvedilol (COREG) 25 MG tablet Take 25 mg by mouth 2 (two) times daily with a meal.   Yes Historical Provider, MD  cetirizine (ZYRTEC) 10 MG tablet Take 10 mg by mouth every morning.    Yes Historical Provider, MD  cholecalciferol (VITAMIN D) 1000 UNITS tablet Take 1,000 Units by mouth every morning.    Yes Historical Provider, MD  fenofibrate (TRICOR) 145 MG tablet  Take 145 mg by mouth every morning.   Yes Historical Provider, MD  ferrous sulfate 325 (65 FE) MG tablet Take 325 mg by mouth daily with breakfast.   Yes Historical Provider, MD  furosemide (LASIX) 20 MG tablet Take 1 tablet (20 mg total) by mouth daily. 10/18/15  Yes Erline Hau, MD  insulin detemir (LEVEMIR) 100 UNIT/ML injection Inject 43 Units into the skin at bedtime.   Yes Historical Provider, MD  losartan (COZAAR) 100 MG tablet Take 100 mg by mouth every morning.    Yes Historical Provider, MD  Multiple Vitamins-Minerals (PRESERVISION AREDS) CAPS Take 1 capsule by mouth 2 (two) times daily.   Yes Historical Provider, MD  Omega-3 Fatty Acids (FISH OIL) 1000 MG CAPS Take 1,000 mg by mouth daily.    Yes Historical Provider, MD  omeprazole (PRILOSEC) 20 MG capsule Take 20 mg by mouth every morning.    Yes Historical Provider, MD  oxybutynin (DITROPAN-XL) 5 MG 24 hr tablet Take 5 mg by mouth at bedtime.   Yes Historical Provider, MD  traMADol (ULTRAM) 50 MG tablet Take 1 tablet (50 mg total) by mouth every 6 (six) hours as needed for moderate pain. 08/17/14  Yes Everitt Amber, MD  warfarin (COUMADIN) 4 MG tablet Take 4 mg by mouth daily.   Yes Historical Provider, MD  nitroGLYCERIN (NITROSTAT) 0.4 MG SL tablet Place 0.4 mg under the tongue every 5 (five) minutes as needed for chest pain.    Historical Provider, MD    Family History Family History  Problem Relation Age of Onset  . Stomach cancer Father   . Colon cancer Brother     Social History Social History  Substance Use Topics  . Smoking status: Never Smoker  . Smokeless tobacco: Never Used  . Alcohol use No     Allergies   Enalapril maleate; Sulfa antibiotics; and Vasotec [enalaprilat]   Review of Systems Review of Systems  Respiratory: Positive for shortness of breath.   All other systems reviewed and are negative.    Physical Exam Updated Vital Signs BP (!) 151/108   Pulse 90   Temp 97.5 F (36.4 C)  (Oral)   Resp 16   Ht 5\' 3"  (1.6 m)   Wt 200 lb (90.7 kg)   SpO2 94%   BMI 35.43 kg/m   Physical Exam  Constitutional: She is oriented to person, place, and time. She appears well-developed and well-nourished.  HENT:  Head: Normocephalic and atraumatic.  Right Ear: External ear normal.  Left Ear: External ear normal.  Nose: Nose  normal.  Mouth/Throat: Oropharynx is clear and moist.  Eyes: Conjunctivae and EOM are normal. Pupils are equal, round, and reactive to light.  Neck: Normal range of motion. Neck supple.  Cardiovascular: Normal rate, normal heart sounds and intact distal pulses.  An irregularly irregular rhythm present.  Pulmonary/Chest: Tachypnea noted. She has rales.  Abdominal: Soft. Bowel sounds are normal.  Musculoskeletal: She exhibits edema.  Neurological: She is alert and oriented to person, place, and time.  Skin: Skin is warm.  Psychiatric: She has a normal mood and affect. Her behavior is normal. Judgment and thought content normal.  Nursing note and vitals reviewed.    ED Treatments / Results  Labs (all labs ordered are listed, but only abnormal results are displayed) Labs Reviewed  COMPREHENSIVE METABOLIC PANEL - Abnormal; Notable for the following:       Result Value   BUN 26 (*)    Creatinine, Ser 1.37 (*)    GFR calc non Af Amer 33 (*)    GFR calc Af Amer 39 (*)    All other components within normal limits  CBC WITH DIFFERENTIAL/PLATELET - Abnormal; Notable for the following:    Hemoglobin 11.4 (*)    All other components within normal limits  BRAIN NATRIURETIC PEPTIDE - Abnormal; Notable for the following:    B Natriuretic Peptide 258.0 (*)    All other components within normal limits  URINALYSIS, ROUTINE W REFLEX MICROSCOPIC (NOT AT [facility]) - Abnormal; Notable for the following:    Protein, ur 100 (*)    All other components within normal limits  PROTIME-INR - Abnormal; Notable for the following:    Prothrombin Time 27.2 (*)    All other  components within normal limits  URINE MICROSCOPIC-ADD ON - Abnormal; Notable for the following:    Squamous Epithelial / LPF 0-5 (*)    Bacteria, UA RARE (*)    Casts GRANULAR CAST (*)    All other components within normal limits  TROPONIN I    EKG  EKG Interpretation  Date/Time:  Thursday February 27 2016 14:54:13 EDT Ventricular Rate:  93 PR Interval:    QRS Duration: 93 QT Interval:  320 QTC Calculation: 421 R Axis:   59 Text Interpretation:  Afib/flut and V-paced complexes No further rhythm analysis attempted due to paced rhythm Nonspecific repol abnormality, diffuse leads atrial fibrillation with occasional ventricularly paced rhythm Confirmed by LIU MD, DANA [phone]) on 02/27/2016 3:04:25 PM       Radiology Dg Chest 2 View  Result Date: 02/27/2016 CLINICAL DATA:  Nonproductive cough, shortness of Breath EXAM: CHEST  2 VIEW COMPARISON:  10/16/2015 FINDINGS: Cardiomediastinal silhouette stress that cardiomegaly again noted. 2 leads cardiac pacemaker again noted with leads in right atrium and right ventricle. Mild perihilar interstitial prominence bilateral without convincing pulmonary edema. Minimal bronchitic changes. No segmental infiltrate. IMPRESSION: Mild perihilar interstitial prominence bilateral without convincing pulmonary edema. Minimal bronchitic changes. No segmental infiltrate. Electronically Signed   By: Lahoma Crocker M.D.   On: 02/27/2016 16:04    Procedures Procedures (including critical care time)  Medications Ordered in ED Medications  furosemide (LASIX) injection 40 mg (40 mg Intravenous Given 02/27/16 1609)     Initial Impression / Assessment and Plan / ED Course  I have reviewed the triage vital signs and the nursing notes.  Pertinent labs & imaging results that were available during my care of the patient were reviewed by me and considered in my medical decision making (see chart for details).  Clinical Course   Pt is oxygenating well when sitting  still, but gets very sob with mvmt.  Pt given IV lasix here.  Pt d/w Dr. Tamala Julian ([facility]) for admission.  Final Clinical Impressions(s) / ED Diagnoses   Final diagnoses:  Acute on chronic diastolic congestive heart failure (HCC)  Persistent atrial fibrillation [facility])    New Prescriptions New Prescriptions   No medications on file     Isla Pence, MD 02/27/16 1736

## 2016-02-27 NOTE — H&P (Signed)
History and Physical    Summer Wong [id number] DOB: 1925/11/01 DOA: 02/27/2016  Referring MD/NP/PA: Dr. Gilford Raid PCP: Chip Boer, PA-C  Patient coming from: Cardiologist Office Dr.  Laurel Dimmer Complaint: Shortness of breath  HPI: Summer Wong is a 82 y.o. female with medical history significant of HTN, HLD, DM type II, diastolic CHF last echocardiogram in 09/2015 EF 60-65% with grade 2 diastolic dysfunction, CKD stage III, PAF on Coumadin, SSS s/p dual pacemaker, and CAD; who presents with complaints of shortness of breath.  Patient notes symptoms significantly worse over the last week or so. Shortness of breath worsened with any exertion. Associated symptoms included nonproductive cough. Patient had been traveling to [place] approximately 2 weeks ago and when she came back was noted to have significant right leg swelling and cellulitis. She was given antibiotics and cream to apply on the leg. Denies any chest pain, diarrhea, nausea, vomiting, fever, chills, or upset stomach. Today, she had gone to a routine follow-up with her cardiologist office. At the office visit she was noted to be up 8 pounds, and have respiratory distress on physical exam for which which she was recommended to come in to the emergency department for further evaluation. Last admitted to [facility]  in 09/2015 with similar symptoms. Patient's daughter-in-law is present at bedside and also notes that the patient's kidney function had previously been very low secondary to over diuresis reporting a GFR as low as 17. They had recently followed up with her kidney specialist and her kidney function had overall improved and she just wanted to make sure that we were  careful with her kidneys.  ED Course: For admission to the emergency department patient was seen to be afebrile, pulse up to 102, respirations up to 32, blood pressures 145/87 - 177/71, all other vitals relatively within normal limits. Lab work revealed hemoglobin  11.4,  BUN 26, creatinine 1.37, INR 2.47, troponin <0.03, and BNP 255. Urinalysis was negative. Chest x-ray revealed mild perihilar interstitial prominence without clear signs of pulmonary edema. Patient was given 40 mg of Lasix IV while in the ED.   Review of Systems: As per HPI otherwise 10 point review of systems negative.   Past Medical History:  Diagnosis Date  . Arthritis   . Atrial fibrillation ([place])   . Baker's cyst   . Bradycardia   . CHF (congestive heart failure) ([place])   . COPD (chronic obstructive pulmonary disease) ([place])   . Diabetes mellitus without complication ([place])   . GERD (gastroesophageal reflux disease)   . Heart murmur   . Hepatitis    hx of Hep A - years ago   . Hyperlipemia   . Hypertension   . Presence of permanent cardiac pacemaker   . Shortness of breath dyspnea    with exertion   . Sleep apnea    cpap - patient does not know settings   . Stress incontinence     Past Surgical History:  Procedure Laterality Date  . BLADDER SUSPENSION    . CHOLECYSTECTOMY    . PACEMAKER INSERTION    . ROBOTIC ASSISTED TOTAL HYSTERECTOMY WITH BILATERAL SALPINGO OOPHERECTOMY Bilateral 08/16/2014   Procedure:  ROBOTIC ASSISTED TOTAL HYSTERECTOMY WITH BILATERAL SALPINGO OOPHORECTOMY AND LYMPHADENECTOMY;  Surgeon: Everitt Amber, MD;  Location: [facility];  Service: Gynecology;  Laterality: Bilateral;  . TUBAL LIGATION       reports that she has never smoked. She has never used smokeless tobacco. She reports that she does not drink alcohol or use  drugs.  Allergies  Allergen Reactions  . Enalapril Maleate Swelling    Throat swelling  . Sulfa Antibiotics Other (See Comments)    Does not remember.   Michail Sermon [Enalaprilat] Swelling    Tongue.     Family History  Problem Relation Age of Onset  . Stomach cancer Father   . Colon cancer Brother     Prior to Admission medications   Medication Sig Start Date End Date Taking? Authorizing Provider  acetaminophen (TYLENOL) 500  MG tablet Take 1,000 mg by mouth every 6 (six) hours as needed for moderate pain or headache.   Yes Historical Provider, MD  albuterol (PROVENTIL HFA;VENTOLIN HFA) 108 (90 BASE) MCG/ACT inhaler Inhale 1-2 puffs into the lungs every 6 (six) hours as needed for wheezing or shortness of breath.   Yes Historical Provider, MD  amLODipine (NORVASC) 10 MG tablet Take 10 mg by mouth every morning.    Yes Historical Provider, MD  aspirin EC 81 MG tablet Take 81 mg by mouth every morning.   Yes Historical Provider, MD  atorvastatin (LIPITOR) 40 MG tablet Take 40 mg by mouth at bedtime.   Yes Historical Provider, MD  carvedilol (COREG) 25 MG tablet Take 25 mg by mouth 2 (two) times daily with a meal.   Yes Historical Provider, MD  cetirizine (ZYRTEC) 10 MG tablet Take 10 mg by mouth every morning.    Yes Historical Provider, MD  cholecalciferol (VITAMIN D) 1000 UNITS tablet Take 1,000 Units by mouth every morning.    Yes Historical Provider, MD  fenofibrate (TRICOR) 145 MG tablet Take 145 mg by mouth every morning.   Yes Historical Provider, MD  ferrous sulfate 325 (65 FE) MG tablet Take 325 mg by mouth daily with breakfast.   Yes Historical Provider, MD  furosemide (LASIX) 20 MG tablet Take 1 tablet (20 mg total) by mouth daily. 10/18/15  Yes Erline Hau, MD  insulin detemir (LEVEMIR) 100 UNIT/ML injection Inject 43 Units into the skin at bedtime.   Yes Historical Provider, MD  losartan (COZAAR) 100 MG tablet Take 100 mg by mouth every morning.    Yes Historical Provider, MD  Multiple Vitamins-Minerals (PRESERVISION AREDS) CAPS Take 1 capsule by mouth 2 (two) times daily.   Yes Historical Provider, MD  Omega-3 Fatty Acids (FISH OIL) 1000 MG CAPS Take 1,000 mg by mouth daily.    Yes Historical Provider, MD  omeprazole (PRILOSEC) 20 MG capsule Take 20 mg by mouth every morning.    Yes Historical Provider, MD  oxybutynin (DITROPAN-XL) 5 MG 24 hr tablet Take 5 mg by mouth at bedtime.   Yes Historical  Provider, MD  traMADol (ULTRAM) 50 MG tablet Take 1 tablet (50 mg total) by mouth every 6 (six) hours as needed for moderate pain. 08/17/14  Yes Everitt Amber, MD  warfarin (COUMADIN) 4 MG tablet Take 4 mg by mouth daily.   Yes Historical Provider, MD  nitroGLYCERIN (NITROSTAT) 0.4 MG SL tablet Place 0.4 mg under the tongue every 5 (five) minutes as needed for chest pain.    Historical Provider, MD    Physical Exam:   Constitutional: Elderly female in moderate respiratory distress  Vitals:   02/27/16 1645 02/27/16 1700 02/27/16 1730 02/27/16 1800  BP: 177/71 (!) 151/108 151/83 145/87  Pulse: 65 90 81 84  Resp: 26 16 (!) 32 20  Temp:      TempSrc:      SpO2: 96% 94% 93% 94%  Weight:  Height:       Eyes: PERRL, lids and conjunctivae normal ENMT: Mucous membranes are moist. Posterior pharynx clear of any exudate or lesions.Normal dentition.  Neck: normal, supple, no masses, no thyromegaly Respiratory: Crackles and rales appreciated in both lung fields. Patient with normal respiratory effort no accessory muscle usage. Cardiovascular: Irregular irregular heart rhythm. +SEM. +1 pitting edema on the bilateral lower extremities. 2+ pedal pulses. No carotid bruits.  Abdomen: no tenderness, no masses palpated. No hepatosplenomegaly. Bowel sounds positive.  Musculoskeletal: no clubbing / cyanosis. No joint deformity upper and lower extremities. Good ROM, no contractures. Normal muscle tone.  Skin: no rashes, lesions, ulcers. No induration Neurologic: CN 2-12 grossly intact. Sensation intact, DTR normal. Strength 5/5 in all 4.  Psychiatric: Normal judgment and insight. Alert and oriented x 3. Normal mood.     Labs on Admission: I have personally reviewed following labs and imaging studies  CBC:  Recent Labs Lab 02/27/16 1525  WBC 6.7  NEUTROABS 3.6  HGB 11.4*  HCT 36.2  MCV 84.4  PLT Q000111Q   Basic Metabolic Panel:  Recent Labs Lab 02/27/16 1607  NA 138  K 4.4  CL 106  CO2 27   GLUCOSE 85  BUN 26*  CREATININE 1.37*  CALCIUM 9.2   GFR: Estimated Creatinine Clearance: 30.3 mL/min (by C-G formula based on SCr of 1.37 mg/dL (H)). Liver Function Tests:  Recent Labs Lab 02/27/16 1607  AST 18  ALT 20  ALKPHOS 59  BILITOT 0.5  PROT 7.0  ALBUMIN 3.8   No results for input(s): LIPASE, AMYLASE in the last 168 hours. No results for input(s): AMMONIA in the last 168 hours. Coagulation Profile:  Recent Labs Lab 02/27/16 1531  INR 2.47   Cardiac Enzymes:  Recent Labs Lab 02/27/16 1607  TROPONINI <0.03   BNP (last 3 results) No results for input(s): PROBNP in the last 8760 hours. HbA1C: No results for input(s): HGBA1C in the last 72 hours. CBG: No results for input(s): GLUCAP in the last 168 hours. Lipid Profile: No results for input(s): CHOL, HDL, LDLCALC, TRIG, CHOLHDL, LDLDIRECT in the last 72 hours. Thyroid Function Tests: No results for input(s): TSH, T4TOTAL, FREET4, T3FREE, THYROIDAB in the last 72 hours. Anemia Panel: No results for input(s): VITAMINB12, FOLATE, FERRITIN, TIBC, IRON, RETICCTPCT in the last 72 hours. Urine analysis:    Component Value Date/Time   COLORURINE YELLOW 02/27/2016 1522   APPEARANCEUR CLEAR 02/27/2016 1522   LABSPEC 1.010 02/27/2016 1522   PHURINE 6.0 02/27/2016 1522   GLUCOSEU NEGATIVE 02/27/2016 1522   HGBUR NEGATIVE 02/27/2016 1522   BILIRUBINUR NEGATIVE 02/27/2016 1522   KETONESUR NEGATIVE 02/27/2016 1522   PROTEINUR 100 (A) 02/27/2016 1522   UROBILINOGEN 0.2 08/30/2014 1838   NITRITE NEGATIVE 02/27/2016 1522   LEUKOCYTESUR NEGATIVE 02/27/2016 1522   Sepsis Labs: No results found for this or any previous visit (from the past 240 hour(s)).   Radiological Exams on Admission: Dg Chest 2 View  Result Date: 02/27/2016 CLINICAL DATA:  Nonproductive cough, shortness of Breath EXAM: CHEST  2 VIEW COMPARISON:  10/16/2015 FINDINGS: Cardiomediastinal silhouette stress that cardiomegaly again noted. 2 leads  cardiac pacemaker again noted with leads in right atrium and right ventricle. Mild perihilar interstitial prominence bilateral without convincing pulmonary edema. Minimal bronchitic changes. No segmental infiltrate. IMPRESSION: Mild perihilar interstitial prominence bilateral without convincing pulmonary edema. Minimal bronchitic changes. No segmental infiltrate. Electronically Signed   By: Lahoma Crocker M.D.   On: 02/27/2016 16:04    EKG:  Independently reviewed. Atrial fibrillation with ventricular pacing  Assessment/Plan Diastolic CHF (congestive heart failure): Acute on chronic. Patient presents with a 8 pound weight over the last week. echocardiogram appeared to have - Admit to a telemetry bed  - Strict ins and outs - Lasix 40 mg IV every 12 hours as tolerated - Continue Coreg and losartan - We'll need to follow-up patient's cardiologist  Paroxysmal atrial fibrillation on chronic anticoagulation: Acute on Chronic. Patient on Coumadin with therapeutic INR at 2.47 on admission. Chadsvasc score = 7 - Pharmacy to dose Coumadin  Left leg swelling/cellulitis - Check vascular duplex Doppler ultrasound of lower extremity  Chronic kidney disease stage III: Patient overall kidney function improved from previous as creatinine down from 2.4 to 1.37 on admission. - Continue to monitor  Essential hypertension - Continue amlodipine, losartan, and Coreg  Diabetes mellitus type 2 - Hypoglycemic protocol - Decreased home Levemir to 21 units subcutaneous daily at bedtime after repeat blood glucose elevated to 201 after patient ate [employer] meal. - CBGs every before meals and at bedtime with sliding scale insulin for now  - Adjust regimen as needed  Dyslipidemia  - Continue TriCor and atorvastatin   Iron deficiency anemia : Chronic hemoglobin 11.4 on admission. - Continue daily iron supplementation   SSS s/p dual-chamber pacemaker - Continue to monitor  GERD - Pharmacy substitution of  Protonix for omeprazole  DVT prophylaxis: on Coumadin Code Status: Full  Family Communication:  Disposition Plan:  Consults called:  Admission status: Inpatient telemetry  Norval Morton MD [facility] Pager [phone]  If 7PM-7AM, please contact night-coverage [email or web address] Password [facility]  02/27/2016, 6:39 PM

## 2016-02-27 NOTE — ED Triage Notes (Signed)
Patient has been short of breath several days and presented to cardiologist this morning and was sent to the ED for admission for CHF.

## 2016-02-27 NOTE — ED Notes (Signed)
Report given, they will call when room 306 is clean and ready

## 2016-02-27 NOTE — ED Notes (Signed)
Pt back from x-ray.

## 2016-02-27 NOTE — Progress Notes (Signed)
[place] for Warfarin (home med) Indication: atrial fibrillation  Allergies  Allergen Reactions  . Enalapril Maleate Swelling    Throat swelling  . Sulfa Antibiotics Other (See Comments)    Does not remember.   Michail Sermon [Enalaprilat] Swelling    Tongue.     Patient Measurements: Height: 5\' 3"  (160 cm) Weight: 200 lb (90.7 kg) IBW/kg (Calculated) : 52.4   Vital Signs: Temp: 97.5 F (36.4 C) (11/02 1453) Temp Source: Oral (11/02 1453) BP: 155/65 (11/02 1845) Pulse Rate: 63 (11/02 1845)  Labs:  Recent Labs  02/27/16 1525 02/27/16 1531 02/27/16 1607  HGB 11.4*  --   --   HCT 36.2  --   --   PLT 214  --   --   LABPROT  --  27.2*  --   INR  --  2.47  --   CREATININE  --   --  1.37*  TROPONINI  --   --  <0.03    Estimated Creatinine Clearance: 30.3 mL/min (by C-G formula based on SCr of 1.37 mg/dL (H)).   Medical History: Past Medical History:  Diagnosis Date  . Arthritis   . Atrial fibrillation ([place])   . Baker's cyst   . Bradycardia   . CHF (congestive heart failure) ([place])   . COPD (chronic obstructive pulmonary disease) ([place])   . Diabetes mellitus without complication ([place])   . GERD (gastroesophageal reflux disease)   . Heart murmur   . Hepatitis    hx of Hep A - years ago   . Hyperlipemia   . Hypertension   . Presence of permanent cardiac pacemaker   . Shortness of breath dyspnea    with exertion   . Sleep apnea    cpap - patient does not know settings   . Stress incontinence     Medications:   (Not in a hospital admission)  Med reviewed, home dose 4mg  Daily.  Assessment: Okay for Protocol, dose already given today.  INR at goal.  Goal of Therapy:  INR 2-3   Plan:  No further dosing tonight.  Daily PT/INR. Monitor for signs and symptoms of bleeding.     Biagio Quint R 02/27/2016,7:39 PM

## 2016-02-27 NOTE — ED Notes (Signed)
Patient ambulatory to restroom with steady gait with assistance

## 2016-02-27 NOTE — ED Notes (Signed)
MD at the bedside  

## 2016-02-27 NOTE — ED Notes (Signed)
Pt ambulatory to the bathroom, steady gait, family at the bedside

## 2016-02-28 ENCOUNTER — Inpatient Hospital Stay (HOSPITAL_COMMUNITY): Payer: Medicare Other

## 2016-02-28 DIAGNOSIS — I5033 Acute on chronic diastolic (congestive) heart failure: Secondary | ICD-10-CM

## 2016-02-28 DIAGNOSIS — N183 Chronic kidney disease, stage 3 (moderate): Secondary | ICD-10-CM

## 2016-02-28 DIAGNOSIS — Z794 Long term (current) use of insulin: Secondary | ICD-10-CM

## 2016-02-28 DIAGNOSIS — E118 Type 2 diabetes mellitus with unspecified complications: Secondary | ICD-10-CM

## 2016-02-28 DIAGNOSIS — I495 Sick sinus syndrome: Secondary | ICD-10-CM

## 2016-02-28 DIAGNOSIS — I10 Essential (primary) hypertension: Secondary | ICD-10-CM

## 2016-02-28 DIAGNOSIS — I480 Paroxysmal atrial fibrillation: Secondary | ICD-10-CM

## 2016-02-28 LAB — BASIC METABOLIC PANEL
ANION GAP: 7 (ref 5–15)
BUN: 31 mg/dL — ABNORMAL HIGH (ref 6–20)
CHLORIDE: 103 mmol/L (ref 101–111)
CO2: 29 mmol/L (ref 22–32)
CREATININE: 1.51 mg/dL — AB (ref 0.44–1.00)
Calcium: 8.8 mg/dL — ABNORMAL LOW (ref 8.9–10.3)
GFR calc non Af Amer: 30 mL/min — ABNORMAL LOW (ref >60)
GFR, EST AFRICAN AMERICAN: 34 mL/min — AB (ref >60)
Glucose, Bld: 93 mg/dL (ref 65–99)
Potassium: 4.1 mmol/L (ref 3.5–5.1)
Sodium: 139 mmol/L (ref 135–145)

## 2016-02-28 LAB — GLUCOSE, CAPILLARY
GLUCOSE-CAPILLARY: 87 mg/dL (ref 65–99)
GLUCOSE-CAPILLARY: 165 mg/dL — AB (ref 65–99)
GLUCOSE-CAPILLARY: 197 mg/dL — AB (ref 65–99)
Glucose-Capillary: 174 mg/dL — ABNORMAL HIGH (ref 65–99)

## 2016-02-28 LAB — PROTIME-INR
INR: 2.36
Prothrombin Time: 26.2 seconds — ABNORMAL HIGH (ref 11.4–15.2)

## 2016-02-28 MED ORDER — WARFARIN SODIUM 2 MG PO TABS
4.0000 mg | ORAL_TABLET | Freq: Once | ORAL | Status: AC
  Administered 2016-02-28: 4 mg via ORAL
  Filled 2016-02-28: qty 2

## 2016-02-28 MED ORDER — OCUVITE-LUTEIN PO CAPS
1.0000 | ORAL_CAPSULE | Freq: Every day | ORAL | Status: DC
  Administered 2016-02-28 – 2016-02-29 (×2): 1 via ORAL
  Filled 2016-02-28 (×2): qty 1

## 2016-02-28 NOTE — Progress Notes (Signed)
PROGRESS NOTE    Summer Wong  [id number] DOB: Mar 16, 1926 DOA: 02/27/2016 PCP: Geroge Baseman    Brief Narrative:  51 yof with a history of HTN, HLD, DM type 2, and diastolic CHF, CKD stage 3, PAF on coumadin, SSS, and CAD, presents with complaints of shortness of breath. During PCP visit she was noted to be ip 8 pounds, she was referred for admission for volume overload. While in the ED, she was noted to have a BNP of 258.0 and a hemoglobin of 11.4. She was started on lasix for volume overload and admitted for further evaluation of congestive heart failure.   Assessment & Plan:   Principal Problem:   CHF exacerbation ([place]) Active Problems:   A-fib (H[facility])   Diabetes ([place])   Sick sinus syndrome (H[facility])   CKD (chronic kidney disease) stage 3, GFR 30-59 ml/min   Essential hypertension  1. Acute on chronic diastolic CHF. Patient presents from PCP in volume overload. Recent echocardiogram showed an EF of 60-65% with grade two diastolic dysfunction. We'll continue IV lasix, coreg, and losartan. Continue to monitor intake and output. She has had good urine output thus far and symptomatically is feeling better. If she continues to improve, can consider transitioning to oral diuretics tomorrow. 2. PAF. Patient is chronically anticoagulated on coumadin, will continue. INR is within normal limits. CHADSVASC score=7. Continue coreg. Continue to monitor.  3. Left leg erythema. Chronic. Does not appear to be infectious. Afebrile and no leukocytosis. Possibly venous stasis. Venous dopplers negative for DVT. Continue to monitor 4. CKD stage III. Patients creatinine appears to be at baseline. Will monitor closely in the setting of diuresis.  5. Iron deficiency anemia. Hemoglobin 11.4. Continue iron supplement.  6. HTN. Continue amlodipine, losartan, and coreg.  7. DM type 2. Continue SSI and levemir.  8.  HLD. Continue statin.  9. GERD. Continue Protonix.  10. SSS s/p dual-chamber pacemaker. Noted.    DVT prophylaxis: Coumadin  Code Status: FULL  Family Communication: son at bedside Disposition Plan: Discharge home once improved.    Consultants:   None   Procedures:   None   Antimicrobials:   None    Subjective: Breathing is improving. Feels that swelling is improving  Objective: Vitals:   02/27/16 1845 02/27/16 2006 02/27/16 2200 02/28/16 0653  BP: 155/65 (!) 135/53 (!) 148/53 (!) 120/57  Pulse: 63 84 99 83  Resp: 20 20 20 20   Temp:  98.7 F (37.1 C) 97.6 F (36.4 C) 98.1 F (36.7 C)  TempSrc:  Oral Oral Oral  SpO2: 95% 96% 95% 95%  Weight:  88.9 kg (195 lb 15.8 oz)  87.4 kg (192 lb 11.2 oz)  Height:  5\' 3"  (1.6 m)      Intake/Output Summary (Last 24 hours) at 02/28/16 0837 Last data filed at 02/28/16 0730  Gross per 24 hour  Intake                0 ml  Output             4703 ml  Net            -4703 ml   Filed Weights   02/27/16 1453 02/27/16 2006 02/28/16 0653  Weight: 90.7 kg (200 lb) 88.9 kg (195 lb 15.8 oz) 87.4 kg (192 lb 11.2 oz)    Examination:  General exam: Appears calm and comfortable  Respiratory system: Clear to auscultation. Respiratory effort normal. Cardiovascular system: S1 & S2 heard, RRR. No JVD, murmurs, rubs,  gallops or clicks. 1+ pedal edema. Gastrointestinal system: Abdomen is nondistended, soft and nontender. No organomegaly or masses felt. Normal bowel sounds heard. Central nervous system: Alert and oriented. No focal neurological deficits. Extremities: Symmetric 5 x 5 power. Skin: No rashes, lesions or ulcers Psychiatry: Judgement and insight appear normal. Mood & affect appropriate.     Data Reviewed: I have personally reviewed following labs and imaging studies  CBC:  Recent Labs Lab 02/27/16 1525  WBC 6.7  NEUTROABS 3.6  HGB 11.4*  HCT 36.2  MCV 84.4  PLT Q000111Q   Basic Metabolic Panel:  Recent Labs Lab 02/27/16 1607 02/28/16 0622  NA 138 139  K 4.4 4.1  CL 106 103  CO2 27 29  GLUCOSE 85 93  BUN  26* 31*  CREATININE 1.37* 1.51*  CALCIUM 9.2 8.8*   GFR: Estimated Creatinine Clearance: 27 mL/min (by C-G formula based on SCr of 1.51 mg/dL (H)). Liver Function Tests:  Recent Labs Lab 02/27/16 1607  AST 18  ALT 20  ALKPHOS 59  BILITOT 0.5  PROT 7.0  ALBUMIN 3.8   No results for input(s): LIPASE, AMYLASE in the last 168 hours. No results for input(s): AMMONIA in the last 168 hours. Coagulation Profile:  Recent Labs Lab 02/27/16 1531 02/28/16 0622  INR 2.47 2.36   Cardiac Enzymes:  Recent Labs Lab 02/27/16 1607  TROPONINI <0.03   BNP (last 3 results) No results for input(s): PROBNP in the last 8760 hours. HbA1C: No results for input(s): HGBA1C in the last 72 hours. CBG:  Recent Labs Lab 02/27/16 2122 02/28/16 0735  GLUCAP 201* 87   Lipid Profile: No results for input(s): CHOL, HDL, LDLCALC, TRIG, CHOLHDL, LDLDIRECT in the last 72 hours. Thyroid Function Tests: No results for input(s): TSH, T4TOTAL, FREET4, T3FREE, THYROIDAB in the last 72 hours. Anemia Panel: No results for input(s): VITAMINB12, FOLATE, FERRITIN, TIBC, IRON, RETICCTPCT in the last 72 hours. Sepsis Labs: No results for input(s): PROCALCITON, LATICACIDVEN in the last 168 hours.  No results found for this or any previous visit (from the past 240 hour(s)).       Radiology Studies: Dg Chest 2 View  Result Date: 02/27/2016 CLINICAL DATA:  Nonproductive cough, shortness of Breath EXAM: CHEST  2 VIEW COMPARISON:  10/16/2015 FINDINGS: Cardiomediastinal silhouette stress that cardiomegaly again noted. 2 leads cardiac pacemaker again noted with leads in right atrium and right ventricle. Mild perihilar interstitial prominence bilateral without convincing pulmonary edema. Minimal bronchitic changes. No segmental infiltrate. IMPRESSION: Mild perihilar interstitial prominence bilateral without convincing pulmonary edema. Minimal bronchitic changes. No segmental infiltrate. Electronically Signed    By: Lahoma Crocker M.D.   On: 02/27/2016 16:04        Scheduled Meds: . amLODipine  10 mg Oral q morning - 10a  . aspirin EC  81 mg Oral q morning - 10a  . atorvastatin  40 mg Oral QHS  . carvedilol  25 mg Oral BID WC  . fenofibrate  160 mg Oral Daily  . ferrous sulfate  325 mg Oral Q breakfast  . furosemide  40 mg Intravenous Q12H  . insulin aspart  0-15 Units Subcutaneous TID WC  . insulin detemir  21 Units Subcutaneous QHS  . loratadine  10 mg Oral Daily  . losartan  100 mg Oral q morning - 10a  . multivitamin-lutein  1 capsule Oral Daily  . oxybutynin  5 mg Oral QHS  . pantoprazole  40 mg Oral Daily  . sodium chloride flush  3 mL Intravenous Q12H  . Warfarin - Pharmacist Dosing Inpatient   Does not apply Q24H   Continuous Infusions:    LOS: 1 day    Time spent: 25 minutes     Kathie Dike, MD [facility] If 7PM-7AM, please contact night-coverage [email or web address] Password [facility] 02/28/2016, 8:37 AM

## 2016-02-28 NOTE — Care Management Important Message (Signed)
Important Message  Patient Details  Name: Summer Wong MRN: [id number] Date of Birth: October 13, 1925   Medicare Important Message Given:  Yes    Sherald Barge, RN 02/28/2016, 10:44 AM

## 2016-02-28 NOTE — Care Management Note (Signed)
Case Management Note  Patient Details  Name: Summer Wong MRN: [id number] Date of Birth: 1925-07-04  Subjective/Objective:                  Pt admitted with CHF exacerbation. She lives alone and is ind with ADL's. She has son and DIL who lives next door. She [facility]CP [facility]. She drives herself to appointments. She has no difficulty affording her medications, she dispenses them herself. She has scale and weighs herself daily. She ambulates with no assistive devices but has cane and walker if she needs them. She has used [facility] in the past after hospitalizations but does not feel they will be helpful time. Pt plans to return home with self care. Pt is not on supplemental oxygen at home PTA, she will need to wean from oxygen or have home O2 assessment prior to DC.   Action/Plan: No CM needs anticipated.   Expected Discharge Date:     03/02/2016             Expected Discharge Plan:  Home/Self Care  In-House Referral:  NA  Discharge planning Services  CM Consult  Post Acute Care Choice:  NA Choice offered to:  NA  Status of Service:  Completed, signed off   Sherald Barge, RN 02/28/2016, 10:45 AM

## 2016-02-28 NOTE — Progress Notes (Signed)
[place] for Warfarin (home med) Indication: atrial fibrillation  Allergies  Allergen Reactions  . Enalapril Maleate Swelling    Throat swelling  . Sulfa Antibiotics Other (See Comments)    Does not remember.   Michail Sermon [Enalaprilat] Swelling    Tongue.     Patient Measurements: Height: 5\' 3"  (160 cm) Weight: 192 lb 11.2 oz (87.4 kg) IBW/kg (Calculated) : 52.4   Vital Signs: Temp: 98.1 F (36.7 C) (11/03 0653) Temp Source: Oral (11/03 0653) BP: 120/57 (11/03 0653) Pulse Rate: 83 (11/03 0653)  Labs:  Recent Labs  02/27/16 1525 02/27/16 1531 02/27/16 1607 02/28/16 0622  HGB 11.4*  --   --   --   HCT 36.2  --   --   --   PLT 214  --   --   --   LABPROT  --  27.2*  --  26.2*  INR  --  2.47  --  2.36  CREATININE  --   --  1.37* 1.51*  TROPONINI  --   --  <0.03  --     Estimated Creatinine Clearance: 27 mL/min (by C-G formula based on SCr of 1.51 mg/dL (H)).   Medical History: Past Medical History:  Diagnosis Date  . Arthritis   . Atrial fibrillation ([place])   . Baker's cyst   . Bradycardia   . CHF (congestive heart failure) ([place])   . COPD (chronic obstructive pulmonary disease) ([place])   . Diabetes mellitus without complication ([place])   . GERD (gastroesophageal reflux disease)   . Heart murmur   . Hepatitis    hx of Hep A - years ago   . Hyperlipemia   . Hypertension   . Presence of permanent cardiac pacemaker   . Shortness of breath dyspnea    with exertion   . Sleep apnea    cpap - patient does not know settings   . Stress incontinence     Medications:  Prescriptions Prior to Admission  Medication Sig Dispense Refill Last Dose  . acetaminophen (TYLENOL) 500 MG tablet Take 1,000 mg by mouth every 6 (six) hours as needed for moderate pain or headache.   Past Month at Unknown time  . albuterol (PROVENTIL HFA;VENTOLIN HFA) 108 (90 BASE) MCG/ACT inhaler Inhale 1-2 puffs into the lungs every 6 (six) hours as needed  for wheezing or shortness of breath.   02/27/2016 at Unknown time  . amLODipine (NORVASC) 10 MG tablet Take 10 mg by mouth every morning.    02/27/2016 at Unknown time  . aspirin EC 81 MG tablet Take 81 mg by mouth every morning.   02/27/2016 at Unknown time  . atorvastatin (LIPITOR) 40 MG tablet Take 40 mg by mouth at bedtime.   02/27/2016 at Unknown time  . carvedilol (COREG) 25 MG tablet Take 25 mg by mouth 2 (two) times daily with a meal.   02/27/2016 at Unknown time  . cetirizine (ZYRTEC) 10 MG tablet Take 10 mg by mouth every morning.    02/27/2016 at Unknown time  . cholecalciferol (VITAMIN D) 1000 UNITS tablet Take 1,000 Units by mouth every morning.    02/27/2016 at Unknown time  . fenofibrate (TRICOR) 145 MG tablet Take 145 mg by mouth every morning.   02/27/2016 at Unknown time  . ferrous sulfate 325 (65 FE) MG tablet Take 325 mg by mouth daily with breakfast.   02/27/2016 at Unknown time  . furosemide (LASIX) 20 MG tablet Take 1 tablet (  20 mg total) by mouth daily. 30 tablet 2 02/26/2016 at Unknown time  . insulin detemir (LEVEMIR) 100 UNIT/ML injection Inject 43 Units into the skin at bedtime.   02/26/2016 at Unknown time  . losartan (COZAAR) 100 MG tablet Take 100 mg by mouth every morning.    02/27/2016 at Unknown time  . Multiple Vitamins-Minerals (PRESERVISION AREDS) CAPS Take 1 capsule by mouth 2 (two) times daily.   02/27/2016 at Unknown time  . Omega-3 Fatty Acids (FISH OIL) 1000 MG CAPS Take 1,000 mg by mouth daily.    02/27/2016 at Unknown time  . omeprazole (PRILOSEC) 20 MG capsule Take 20 mg by mouth every morning.    02/27/2016 at Unknown time  . oxybutynin (DITROPAN-XL) 5 MG 24 hr tablet Take 5 mg by mouth at bedtime.   02/27/2016 at Unknown time  . traMADol (ULTRAM) 50 MG tablet Take 1 tablet (50 mg total) by mouth every 6 (six) hours as needed for moderate pain. 30 tablet 0 unknown  . warfarin (COUMADIN) 4 MG tablet Take 4 mg by mouth daily.   02/27/2016 at 800  . nitroGLYCERIN  (NITROSTAT) 0.4 MG SL tablet Place 0.4 mg under the tongue every 5 (five) minutes as needed for chest pain.   Not Taking at Unknown time    Med reviewed, home dose 4mg  Daily.  Assessment: Okay for Protocol, INR at goal.  No bleeding reported.  Goal of Therapy:  INR 2-3   Plan:  Coumadin 4mg  today x 1 (per home regimen) Daily PT/INR. Monitor for signs and symptoms of bleeding.   [place] Crane, Jaylend Reiland A 02/28/2016,10:56 AM

## 2016-02-29 LAB — BASIC METABOLIC PANEL
ANION GAP: 10 (ref 5–15)
BUN: 43 mg/dL — AB (ref 6–20)
CO2: 27 mmol/L (ref 22–32)
Calcium: 8.5 mg/dL — ABNORMAL LOW (ref 8.9–10.3)
Chloride: 102 mmol/L (ref 101–111)
Creatinine, Ser: 2.04 mg/dL — ABNORMAL HIGH (ref 0.44–1.00)
GFR calc Af Amer: 24 mL/min — ABNORMAL LOW (ref >60)
GFR, EST NON AFRICAN AMERICAN: 21 mL/min — AB (ref >60)
GLUCOSE: 114 mg/dL — AB (ref 65–99)
POTASSIUM: 4 mmol/L (ref 3.5–5.1)
Sodium: 139 mmol/L (ref 135–145)

## 2016-02-29 LAB — CBC
HEMATOCRIT: 33.2 % — AB (ref 36.0–46.0)
Hemoglobin: 10.8 g/dL — ABNORMAL LOW (ref 12.0–15.0)
MCH: 27.2 pg (ref 26.0–34.0)
MCHC: 32.5 g/dL (ref 30.0–36.0)
MCV: 83.6 fL (ref 78.0–100.0)
PLATELETS: 222 10*3/uL (ref 150–400)
RBC: 3.97 MIL/uL (ref 3.87–5.11)
RDW: 14.3 % (ref 11.5–15.5)
WBC: 5.9 10*3/uL (ref 4.0–10.5)

## 2016-02-29 LAB — PROTIME-INR
INR: 2.29
Prothrombin Time: 25.6 seconds — ABNORMAL HIGH (ref 11.4–15.2)

## 2016-02-29 LAB — GLUCOSE, CAPILLARY
GLUCOSE-CAPILLARY: 138 mg/dL — AB (ref 65–99)
GLUCOSE-CAPILLARY: 148 mg/dL — AB (ref 65–99)

## 2016-02-29 MED ORDER — WARFARIN SODIUM 2 MG PO TABS: 4.0000 mg | ORAL_TABLET | Freq: Once | ORAL | Status: DC

## 2016-02-29 MED ORDER — FUROSEMIDE 20 MG PO TABS: 20.0000 mg | ORAL_TABLET | Freq: Every day | ORAL | 2 refills | Status: DC

## 2016-02-29 NOTE — Progress Notes (Signed)
Patient discharged home.  IV removed - WNL.  Reviewed DC instructions and meds, patient able to verbalize understanding of them and CHF management at home regarding daily weighing and diet.  No questions at this time.  Instructed to follow up with PCP in 1-2 weeks.  Patient assisted off unit in NAD

## 2016-02-29 NOTE — Discharge Summary (Signed)
-+Physician Discharge Summary  Summer Wong [id number] DOB: 09/16/1925 DOA: 02/27/2016  PCP: Geroge Baseman  Admit date: 02/27/2016 Discharge date: 02/29/2016   Admitted From: Home Disposition:  Home  Recommendations for Outpatient Follow-up:  1. Follow up with PCP in 1-2 weeks  Home Health: No Equipment/Devices: None  Discharge Condition: None CODE STATUS: Full Diet recommendation: Carb Modified  Brief/Interim Summary: 15 yof with a history of HTN, HLD, DM type 2, and diastolic CHF, CKD stage 3, PAF on coumadin, SSS, and CAD, presents with complaints of shortness of breath. During PCP visit she was noted to be up 8 pounds, she was referred for admission for volume overload. While in the ED, she was noted to have a BNP of 258.0 and a hemoglobin of 11.4. She was started on lasix for volume overload and admitted for further management of congestive heart failure. Marland Kitchen  Discharge Diagnoses:  Principal Problem:   CHF exacerbation ([place]) Active Problems:   A-fib ([place])   Diabetes ([place])   Sick sinus syndrome ([facility])   CKD (chronic kidney disease) stage 3, GFR 30-59 ml/min   Essential hypertension  1. Acute on chronic diastolic CHF. Patient presents from PCP in volume overload. Recent echocardiogram showed an EF of 60-65% with grade two diastolic dysfunction. She has had good urine output thus far and symptomatically is feeling better. She was diuresed with intravenous lasix. She was taking lasix prn at home PTA. Will change lasix to 20mg  daily. She is not a candidate for ACE inhibitor due to renal dysfunction. She is feeling much better today.  2. PAF. Patient is chronically anticoagulated on coumadin, will continue. INR is within normal limits. CHADSVASC score=7. Continue coreg. Continue to monitor.  3. Left leg erythema. Chronic. Does not appear to be infectious. Afebrile and no leukocytosis. Possibly venous stasis. Venous dopplers negative for DVT. Improved with diuresis 4. CKD stage  III. Creatinine has trended up with diuresis. Will hold lasix for 2 days. Hold losartan until follow up with cardiologist. Repeat bmet on follow up 5. Iron deficiency anemia. Hemoglobin 10.8. Stable. 6. HTN. Stable. Continue amlodipine, and coreg.  7. DM type 2. Stable. Continue outpatient regimen. 8. HLD. Continue statin.  9. GERD. Continue Protonix.  10. SSS s/p dual-chamber pacemaker. Noted.    Consultants:   None   Procedures:   None   Antimicrobials:   None   Discharge Instructions  Discharge Instructions    Diet - low sodium heart healthy    Complete by:  As directed    Face-to-face encounter (required for Medicare/Medicaid patients)    Complete by:  As directed    I MEMON,JEHANZEB certify that this patient is under my care and that I, or a nurse practitioner or physician's assistant working with me, had a face-to-face encounter that meets the physician face-to-face encounter requirements with this patient on 02/29/2016. The encounter with the patient was in whole, or in part for the following medical condition(s) which is the primary reason for home health care (List medical condition): chf exacerbation   The encounter with the patient was in whole, or in part, for the following medical condition, which is the primary reason for home health care:  chf exacerbation   I certify that, based on my findings, the following services are medically necessary home health services:  Nursing   Reason for Medically Necessary Home Health Services:  Skilled Nursing- Change/Decline in Patient Status   My clinical findings support the need for the above services:  Shortness of breath  with activity   Further, I certify that my clinical findings support that this patient is homebound due to:  Shortness of Breath with activity   Home Health    Complete by:  As directed    To provide the following care/treatments:  RN   Increase activity slowly    Complete by:  As directed         Medication List    STOP taking these medications   losartan 100 MG tablet Commonly known as:  COZAAR     TAKE these medications   acetaminophen 500 MG tablet Commonly known as:  TYLENOL Take 1,000 mg by mouth every 6 (six) hours as needed for moderate pain or headache.   albuterol 108 (90 Base) MCG/ACT inhaler Commonly known as:  PROVENTIL HFA;VENTOLIN HFA Inhale 1-2 puffs into the lungs every 6 (six) hours as needed for wheezing or shortness of breath.   amLODipine 10 MG tablet Commonly known as:  NORVASC Take 10 mg by mouth every morning.   aspirin EC 81 MG tablet Take 81 mg by mouth every morning.   atorvastatin 40 MG tablet Commonly known as:  LIPITOR Take 40 mg by mouth at bedtime.   carvedilol 25 MG tablet Commonly known as:  COREG Take 25 mg by mouth 2 (two) times daily with a meal.   cetirizine 10 MG tablet Commonly known as:  ZYRTEC Take 10 mg by mouth every morning.   cholecalciferol 1000 units tablet Commonly known as:  VITAMIN D Take 1,000 Units by mouth every morning.   fenofibrate 145 MG tablet Commonly known as:  TRICOR Take 145 mg by mouth every morning.   ferrous sulfate 325 (65 FE) MG tablet Take 325 mg by mouth daily with breakfast.   Fish Oil 1000 MG Caps Take 1,000 mg by mouth daily.   furosemide 20 MG tablet Commonly known as:  LASIX Take 1 tablet (20 mg total) by mouth daily.   insulin detemir 100 UNIT/ML injection Commonly known as:  LEVEMIR Inject 43 Units into the skin at bedtime.   nitroGLYCERIN 0.4 MG SL tablet Commonly known as:  NITROSTAT Place 0.4 mg under the tongue every 5 (five) minutes as needed for chest pain.   omeprazole 20 MG capsule Commonly known as:  PRILOSEC Take 20 mg by mouth every morning.   oxybutynin 5 MG 24 hr tablet Commonly known as:  DITROPAN-XL Take 5 mg by mouth at bedtime.   PRESERVISION AREDS Caps Take 1 capsule by mouth 2 (two) times daily.   traMADol 50 MG tablet Commonly known as:   ULTRAM Take 1 tablet (50 mg total) by mouth every 6 (six) hours as needed for moderate pain.   warfarin 4 MG tablet Commonly known as:  COUMADIN Take 4 mg by mouth daily.      Follow-up Information    CLAGGETT,ELIN, PA-C Follow up in 1 week(s).   Specialty:  Family Medicine Contact information: [address] [place] [address] [phone]          Allergies  Allergen Reactions  . Enalapril Maleate Swelling    Throat swelling  . Sulfa Antibiotics Other (See Comments)    Does not remember.   Michail Sermon [Enalaprilat] Swelling    Tongue.       Procedures/Studies: Dg Chest 2 View  Result Date: 02/27/2016 CLINICAL DATA:  Nonproductive cough, shortness of Breath EXAM: CHEST  2 VIEW COMPARISON:  10/16/2015 FINDINGS: Cardiomediastinal silhouette stress that cardiomegaly again noted. 2 leads cardiac  pacemaker again noted with leads in right atrium and right ventricle. Mild perihilar interstitial prominence bilateral without convincing pulmonary edema. Minimal bronchitic changes. No segmental infiltrate. IMPRESSION: Mild perihilar interstitial prominence bilateral without convincing pulmonary edema. Minimal bronchitic changes. No segmental infiltrate. Electronically Signed   By: Lahoma Crocker M.D.   On: 02/27/2016 16:04   US Venous Img Lower Unilateral Right  Result Date: 02/28/2016 CLINICAL DATA:  82 year old female with a history of cellulitis EXAM: RIGHT LOWER EXTREMITY VENOUS DOPPLER ULTRASOUND TECHNIQUE: Gray-scale sonography with graded compression, as well as color Doppler and duplex ultrasound were performed to evaluate the lower extremity deep venous systems from the level of the common femoral vein and including the common femoral, femoral, profunda femoral, popliteal and calf veins including the posterior tibial, peroneal and gastrocnemius veins when visible. The superficial great saphenous vein was also interrogated. Spectral Doppler was utilized to evaluate flow at rest  and with distal augmentation maneuvers in the common femoral, femoral and popliteal veins. COMPARISON:  None. FINDINGS: Contralateral Common Femoral Vein: Respiratory phasicity is normal and symmetric with the symptomatic side. No evidence of thrombus. Normal compressibility. Common Femoral Vein: No evidence of thrombus. Normal compressibility, respiratory phasicity and response to augmentation. Saphenofemoral Junction: No evidence of thrombus. Normal compressibility and flow on color Doppler imaging. Profunda Femoral Vein: No evidence of thrombus. Normal compressibility and flow on color Doppler imaging. Femoral Vein: No evidence of thrombus. Normal compressibility, respiratory phasicity and response to augmentation. Popliteal Vein: No evidence of thrombus. Normal compressibility, respiratory phasicity and response to augmentation. Calf Veins: No evidence of thrombus. Normal compressibility and flow on color Doppler imaging. Superficial Great Saphenous Vein: No evidence of thrombus. Normal compressibility and flow on color Doppler imaging. Other Findings: Anechoic structure in the right popliteal region likely Baker's cyst measures 4.7 cm x 1.0 cm x 3.6 cm Edema of the right lower extremity. IMPRESSION: Sonographic survey right lower extremity negative for DVT. Likely right sided Baker's cyst. Right lower extremity edema. Signed, Dulcy Fanny. Earleen Newport, DO [facility] Radiology Electronically Signed   By: Corrie Mckusick D.O.   On: 02/28/2016 09:25       Subjective: Shortness of breath improved. No chest pain  Discharge Exam: Vitals:   02/28/16 2212 02/29/16 0523  BP:  (!) 127/58  Pulse: 83 79  Resp: 18 18  Temp:  98 F (36.7 C)   Vitals:   02/28/16 1441 02/28/16 2043 02/28/16 2212 02/29/16 0523  BP: (!) 125/53 122/61  (!) 127/58  Pulse: (!) 20 82 83 79  Resp: 20 20 18 18   Temp: 98.5 F (36.9 C) 98.2 F (36.8 C)  98 F (36.7 C)  TempSrc: Oral Oral   Oral  SpO2: 94% 95% 93% 94%  Weight:    87.2 kg (192 lb 3.9 oz)  Height:        General: Pt is alert, awake, not in acute distress Cardiovascular: RRR, S1/S2 +, no rubs, no gallops Respiratory: CTA bilaterally, no wheezing, no rhonchi Abdominal: Soft, NT, ND, bowel sounds + Extremities: trace edema, no cyanosis    The results of significant diagnostics from this hospitalization (including imaging, microbiology, ancillary and laboratory) are listed below for reference.     Microbiology: No results found for this or any previous visit (from the past 240 hour(s)).   Labs: BNP (last 3 results)  Recent Labs  10/15/15 1730 02/27/16 1525  BNP 616.0* 123XX123*   Basic Metabolic Panel:  Recent Labs Lab 02/27/16 1607  02/28/16 0622 02/29/16 0513  NA 138 139 139  K 4.4 4.1 4.0  CL 106 103 102  CO2 27 29 27   GLUCOSE 85 93 114*  BUN 26* 31* 43*  CREATININE 1.37* 1.51* 2.04*  CALCIUM 9.2 8.8* 8.5*   Liver Function Tests:  Recent Labs Lab 02/27/16 1607  AST 18  ALT 20  ALKPHOS 59  BILITOT 0.5  PROT 7.0  ALBUMIN 3.8   No results for input(s): LIPASE, AMYLASE in the last 168 hours. No results for input(s): AMMONIA in the last 168 hours. CBC:  Recent Labs Lab 02/27/16 1525 02/29/16 0513  WBC 6.7 5.9  NEUTROABS 3.6  --   HGB 11.4* 10.8*  HCT 36.2 33.2*  MCV 84.4 83.6  PLT 214 222   Cardiac Enzymes:  Recent Labs Lab 02/27/16 1607  TROPONINI <0.03   BNP: Invalid input(s): POCBNP CBG:  Recent Labs Lab 02/28/16 0735 02/28/16 1147 02/28/16 1638 02/28/16 2042 02/29/16 0746  GLUCAP 87 174* 165* 197* 138*   D-Dimer No results for input(s): DDIMER in the last 72 hours. Hgb A1c No results for input(s): HGBA1C in the last 72 hours. Lipid Profile No results for input(s): CHOL, HDL, LDLCALC, TRIG, CHOLHDL, LDLDIRECT in the last 72 hours. Thyroid function studies No results for input(s): TSH, T4TOTAL, T3FREE, THYROIDAB in the last 72 hours.  Invalid  input(s): FREET3 Anemia work up No results for input(s): VITAMINB12, FOLATE, FERRITIN, TIBC, IRON, RETICCTPCT in the last 72 hours. Urinalysis    Component Value Date/Time   COLORURINE YELLOW 02/27/2016 1522   APPEARANCEUR CLEAR 02/27/2016 1522   LABSPEC 1.010 02/27/2016 1522   PHURINE 6.0 02/27/2016 1522   GLUCOSEU NEGATIVE 02/27/2016 1522   HGBUR NEGATIVE 02/27/2016 1522   BILIRUBINUR NEGATIVE 02/27/2016 1522   KETONESUR NEGATIVE 02/27/2016 1522   PROTEINUR 100 (A) 02/27/2016 1522   UROBILINOGEN 0.2 08/30/2014 1838   NITRITE NEGATIVE 02/27/2016 1522   LEUKOCYTESUR NEGATIVE 02/27/2016 1522   Sepsis Labs Invalid input(s): PROCALCITONIN,  WBC,  LACTICIDVEN Microbiology No results found for this or any previous visit (from the past 240 hour(s)).   Time coordinating discharge: Over 30 minutes  SIGNED:   Kathie Dike, MD  [facility] 02/29/2016, 11:18 AM If 7PM-7AM, please contact night-coverage [email or web address] Password [email or web address]

## 2016-02-29 NOTE — Progress Notes (Signed)
CM received [place] orders and submitted to [facility] for review.

## 2016-02-29 NOTE — Progress Notes (Signed)
[place] for Warfarin (home med) Indication: atrial fibrillation  Allergies  Allergen Reactions  . Enalapril Maleate Swelling    Throat swelling  . Sulfa Antibiotics Other (See Comments)    Does not remember.   Michail Sermon [Enalaprilat] Swelling    Tongue.    Patient Measurements: Height: 5\' 3"  (160 cm) Weight: 192 lb 3.9 oz (87.2 kg) IBW/kg (Calculated) : 52.4  Vital Signs: Temp: 98 F (36.7 C) (11/04 0523) Temp Source: Oral (11/04 0523) BP: 127/58 (11/04 0523) Pulse Rate: 79 (11/04 0523)  Labs:  Recent Labs  02/27/16 1525 02/27/16 1531 02/27/16 1607 02/28/16 0622 02/29/16 0513  HGB 11.4*  --   --   --  10.8*  HCT 36.2  --   --   --  33.2*  PLT 214  --   --   --  222  LABPROT  --  27.2*  --  26.2* 25.6*  INR  --  2.47  --  2.36 2.29  CREATININE  --   --  1.37* 1.51* 2.04*  TROPONINI  --   --  <0.03  --   --    Estimated Creatinine Clearance: 20 mL/min (by C-G formula based on SCr of 2.04 mg/dL (H)).  Medical History: Past Medical History:  Diagnosis Date  . Arthritis   . Atrial fibrillation ([place])   . Baker's cyst   . Bradycardia   . CHF (congestive heart failure) ([place])   . COPD (chronic obstructive pulmonary disease) ([place])   . Diabetes mellitus without complication ([place])   . GERD (gastroesophageal reflux disease)   . Heart murmur   . Hepatitis    hx of Hep A - years ago   . Hyperlipemia   . Hypertension   . Presence of permanent cardiac pacemaker   . Shortness of breath dyspnea    with exertion   . Sleep apnea    cpap - patient does not know settings   . Stress incontinence    Medications:  Prescriptions Prior to Admission  Medication Sig Dispense Refill Last Dose  . acetaminophen (TYLENOL) 500 MG tablet Take 1,000 mg by mouth every 6 (six) hours as needed for moderate pain or headache.   Past Month at Unknown time  . albuterol (PROVENTIL HFA;VENTOLIN HFA) 108 (90 BASE) MCG/ACT inhaler Inhale 1-2 puffs into  the lungs every 6 (six) hours as needed for wheezing or shortness of breath.   02/27/2016 at Unknown time  . amLODipine (NORVASC) 10 MG tablet Take 10 mg by mouth every morning.    02/27/2016 at Unknown time  . aspirin EC 81 MG tablet Take 81 mg by mouth every morning.   02/27/2016 at Unknown time  . atorvastatin (LIPITOR) 40 MG tablet Take 40 mg by mouth at bedtime.   02/27/2016 at Unknown time  . carvedilol (COREG) 25 MG tablet Take 25 mg by mouth 2 (two) times daily with a meal.   02/27/2016 at Unknown time  . cetirizine (ZYRTEC) 10 MG tablet Take 10 mg by mouth every morning.    02/27/2016 at Unknown time  . cholecalciferol (VITAMIN D) 1000 UNITS tablet Take 1,000 Units by mouth every morning.    02/27/2016 at Unknown time  . fenofibrate (TRICOR) 145 MG tablet Take 145 mg by mouth every morning.   02/27/2016 at Unknown time  . ferrous sulfate 325 (65 FE) MG tablet Take 325 mg by mouth daily with breakfast.   02/27/2016 at Unknown time  . furosemide (LASIX)  20 MG tablet Take 1 tablet (20 mg total) by mouth daily. 30 tablet 2 02/26/2016 at Unknown time  . insulin detemir (LEVEMIR) 100 UNIT/ML injection Inject 43 Units into the skin at bedtime.   02/26/2016 at Unknown time  . losartan (COZAAR) 100 MG tablet Take 100 mg by mouth every morning.    02/27/2016 at Unknown time  . Multiple Vitamins-Minerals (PRESERVISION AREDS) CAPS Take 1 capsule by mouth 2 (two) times daily.   02/27/2016 at Unknown time  . Omega-3 Fatty Acids (FISH OIL) 1000 MG CAPS Take 1,000 mg by mouth daily.    02/27/2016 at Unknown time  . omeprazole (PRILOSEC) 20 MG capsule Take 20 mg by mouth every morning.    02/27/2016 at Unknown time  . oxybutynin (DITROPAN-XL) 5 MG 24 hr tablet Take 5 mg by mouth at bedtime.   02/27/2016 at Unknown time  . traMADol (ULTRAM) 50 MG tablet Take 1 tablet (50 mg total) by mouth every 6 (six) hours as needed for moderate pain. 30 tablet 0 unknown  . warfarin (COUMADIN) 4 MG tablet Take 4 mg by mouth daily.    02/27/2016 at 800  . nitroGLYCERIN (NITROSTAT) 0.4 MG SL tablet Place 0.4 mg under the tongue every 5 (five) minutes as needed for chest pain.   Not Taking at Unknown time    Med reviewed, home dose 4mg  Daily.  Assessment: Okay for Protocol, INR at goal.  No bleeding reported.  Goal of Therapy:  INR 2-3   Plan:  Coumadin 4mg  today x 1 (per home regimen) Daily PT/INR. Monitor for signs and symptoms of bleeding.   [place] Crane, Johnn Krasowski A 02/29/2016,10:06 AM

## 2016-05-20 ENCOUNTER — Other Ambulatory Visit (HOSPITAL_COMMUNITY)
Admission: AD | Admit: 2016-05-20 | Discharge: 2016-05-20 | Disposition: A | Discharge status: Home / Self Care | Payer: Medicare Other | Source: Skilled Nursing Facility | Attending: Emergency Medicine | Admitting: Emergency Medicine

## 2016-05-20 DIAGNOSIS — Z5181 Encounter for therapeutic drug level monitoring: Secondary | ICD-10-CM | POA: Diagnosis present

## 2016-05-20 LAB — PROTIME-INR
INR: 3.25
Prothrombin Time: 33.9 seconds — ABNORMAL HIGH (ref 11.4–15.2)

## 2016-11-02 ENCOUNTER — Inpatient Hospital Stay (HOSPITAL_COMMUNITY)
Admission: EM | Admit: 2016-11-02 | Discharge: 2016-11-06 | DRG: 291 | Disposition: A | Discharge status: Home Health | Payer: Medicare Other | Attending: Internal Medicine | Admitting: Internal Medicine

## 2016-11-02 ENCOUNTER — Encounter (HOSPITAL_COMMUNITY): Payer: Self-pay

## 2016-11-02 ENCOUNTER — Inpatient Hospital Stay (HOSPITAL_COMMUNITY): Payer: Medicare Other

## 2016-11-02 ENCOUNTER — Emergency Department (HOSPITAL_COMMUNITY): Payer: Medicare Other

## 2016-11-02 DIAGNOSIS — I482 Chronic atrial fibrillation: Secondary | ICD-10-CM | POA: Diagnosis present

## 2016-11-02 DIAGNOSIS — I10 Essential (primary) hypertension: Secondary | ICD-10-CM | POA: Diagnosis not present

## 2016-11-02 DIAGNOSIS — E1122 Type 2 diabetes mellitus with diabetic chronic kidney disease: Secondary | ICD-10-CM | POA: Diagnosis present

## 2016-11-02 DIAGNOSIS — H3530 Unspecified macular degeneration: Secondary | ICD-10-CM | POA: Diagnosis present

## 2016-11-02 DIAGNOSIS — I360 Nonrheumatic tricuspid (valve) stenosis: Secondary | ICD-10-CM

## 2016-11-02 DIAGNOSIS — I495 Sick sinus syndrome: Secondary | ICD-10-CM | POA: Diagnosis present

## 2016-11-02 DIAGNOSIS — R0600 Dyspnea, unspecified: Secondary | ICD-10-CM

## 2016-11-02 DIAGNOSIS — I130 Hypertensive heart and chronic kidney disease with heart failure and stage 1 through stage 4 chronic kidney disease, or unspecified chronic kidney disease: Secondary | ICD-10-CM | POA: Diagnosis not present

## 2016-11-02 DIAGNOSIS — I2510 Atherosclerotic heart disease of native coronary artery without angina pectoris: Secondary | ICD-10-CM

## 2016-11-02 DIAGNOSIS — I5033 Acute on chronic diastolic (congestive) heart failure: Secondary | ICD-10-CM | POA: Diagnosis present

## 2016-11-02 DIAGNOSIS — R791 Abnormal coagulation profile: Secondary | ICD-10-CM | POA: Diagnosis present

## 2016-11-02 DIAGNOSIS — E1165 Type 2 diabetes mellitus with hyperglycemia: Secondary | ICD-10-CM

## 2016-11-02 DIAGNOSIS — I252 Old myocardial infarction: Secondary | ICD-10-CM

## 2016-11-02 DIAGNOSIS — N390 Urinary tract infection, site not specified: Secondary | ICD-10-CM | POA: Diagnosis present

## 2016-11-02 DIAGNOSIS — K5900 Constipation, unspecified: Secondary | ICD-10-CM | POA: Diagnosis present

## 2016-11-02 DIAGNOSIS — E876 Hypokalemia: Secondary | ICD-10-CM | POA: Diagnosis present

## 2016-11-02 DIAGNOSIS — G4733 Obstructive sleep apnea (adult) (pediatric): Secondary | ICD-10-CM | POA: Diagnosis present

## 2016-11-02 DIAGNOSIS — Z79899 Other long term (current) drug therapy: Secondary | ICD-10-CM

## 2016-11-02 DIAGNOSIS — Z950 Presence of cardiac pacemaker: Secondary | ICD-10-CM

## 2016-11-02 DIAGNOSIS — N183 Chronic kidney disease, stage 3 (moderate): Secondary | ICD-10-CM | POA: Diagnosis not present

## 2016-11-02 DIAGNOSIS — Z7982 Long term (current) use of aspirin: Secondary | ICD-10-CM

## 2016-11-02 DIAGNOSIS — Z794 Long term (current) use of insulin: Secondary | ICD-10-CM | POA: Diagnosis not present

## 2016-11-02 DIAGNOSIS — E785 Hyperlipidemia, unspecified: Secondary | ICD-10-CM | POA: Diagnosis present

## 2016-11-02 DIAGNOSIS — I209 Angina pectoris, unspecified: Secondary | ICD-10-CM | POA: Diagnosis present

## 2016-11-02 DIAGNOSIS — Z7901 Long term (current) use of anticoagulants: Secondary | ICD-10-CM | POA: Diagnosis not present

## 2016-11-02 DIAGNOSIS — I25119 Atherosclerotic heart disease of native coronary artery with unspecified angina pectoris: Secondary | ICD-10-CM | POA: Diagnosis not present

## 2016-11-02 DIAGNOSIS — R072 Precordial pain: Secondary | ICD-10-CM

## 2016-11-02 DIAGNOSIS — Z800 Family history of malignant neoplasm of digestive organs: Secondary | ICD-10-CM | POA: Diagnosis not present

## 2016-11-02 DIAGNOSIS — R0789 Other chest pain: Secondary | ICD-10-CM | POA: Diagnosis not present

## 2016-11-02 DIAGNOSIS — J449 Chronic obstructive pulmonary disease, unspecified: Secondary | ICD-10-CM | POA: Diagnosis present

## 2016-11-02 DIAGNOSIS — I481 Persistent atrial fibrillation: Secondary | ICD-10-CM | POA: Diagnosis present

## 2016-11-02 DIAGNOSIS — N184 Chronic kidney disease, stage 4 (severe): Secondary | ICD-10-CM | POA: Diagnosis present

## 2016-11-02 DIAGNOSIS — Z888 Allergy status to other drugs, medicaments and biological substances status: Secondary | ICD-10-CM | POA: Diagnosis not present

## 2016-11-02 DIAGNOSIS — I208 Other forms of angina pectoris: Secondary | ICD-10-CM | POA: Diagnosis not present

## 2016-11-02 DIAGNOSIS — R109 Unspecified abdominal pain: Secondary | ICD-10-CM

## 2016-11-02 HISTORY — DX: Malignant (primary) neoplasm, unspecified: C80.1

## 2016-11-02 HISTORY — DX: Disorder of kidney and ureter, unspecified: N28.9

## 2016-11-02 HISTORY — DX: Unspecified macular degeneration: H35.30

## 2016-11-02 HISTORY — DX: Type 2 diabetes mellitus without complications: E11.9

## 2016-11-02 HISTORY — DX: Unspecified asthma, uncomplicated: J45.909

## 2016-11-02 HISTORY — DX: Latent tuberculosis: Z22.7

## 2016-11-02 HISTORY — DX: Chronic diastolic (congestive) heart failure: I50.32

## 2016-11-02 HISTORY — DX: Essential (primary) hypertension: I10

## 2016-11-02 HISTORY — DX: Obstructive sleep apnea (adult) (pediatric): G47.33

## 2016-11-02 HISTORY — DX: Dependence on other enabling machines and devices: Z99.89

## 2016-11-02 HISTORY — DX: Sick sinus syndrome: I49.5

## 2016-11-02 HISTORY — DX: Personal history of other infectious and parasitic diseases: Z86.19

## 2016-11-02 HISTORY — DX: Presence of cardiac pacemaker: Z95.0

## 2016-11-02 HISTORY — DX: Acute myocardial infarction, unspecified: I21.9

## 2016-11-02 LAB — CBC WITH DIFFERENTIAL/PLATELET
BASOS PCT: 0 %
Basophils Absolute: 0 10*3/uL (ref 0.0–0.1)
Eosinophils Absolute: 0.3 10*3/uL (ref 0.0–0.7)
Eosinophils Relative: 3 %
HEMATOCRIT: 39.1 % (ref 36.0–46.0)
HEMOGLOBIN: 12.6 g/dL (ref 12.0–15.0)
LYMPHS ABS: 2.3 10*3/uL (ref 0.7–4.0)
Lymphocytes Relative: 27 %
MCH: 26.9 pg (ref 26.0–34.0)
MCHC: 32.2 g/dL (ref 30.0–36.0)
MCV: 83.5 fL (ref 78.0–100.0)
MONO ABS: 0.7 10*3/uL (ref 0.1–1.0)
MONOS PCT: 8 %
NEUTROS PCT: 62 %
Neutro Abs: 5.2 10*3/uL (ref 1.7–7.7)
Platelets: 227 10*3/uL (ref 150–400)
RBC: 4.68 MIL/uL (ref 3.87–5.11)
RDW: 15.2 % (ref 11.5–15.5)
WBC: 8.6 10*3/uL (ref 4.0–10.5)

## 2016-11-02 LAB — BASIC METABOLIC PANEL
ANION GAP: 9 (ref 5–15)
BUN: 36 mg/dL — AB (ref 6–20)
CO2: 30 mmol/L (ref 22–32)
Calcium: 9.3 mg/dL (ref 8.9–10.3)
Chloride: 102 mmol/L (ref 101–111)
Creatinine, Ser: 1.55 mg/dL — ABNORMAL HIGH (ref 0.44–1.00)
GFR calc Af Amer: 33 mL/min — ABNORMAL LOW (ref >60)
GFR, EST NON AFRICAN AMERICAN: 29 mL/min — AB (ref >60)
GLUCOSE: 163 mg/dL — AB (ref 65–99)
POTASSIUM: 3.8 mmol/L (ref 3.5–5.1)
Sodium: 141 mmol/L (ref 135–145)

## 2016-11-02 LAB — GLUCOSE, CAPILLARY
Glucose-Capillary: 139 mg/dL — ABNORMAL HIGH (ref 65–99)
Glucose-Capillary: 129 mg/dL — ABNORMAL HIGH (ref 65–99)
Glucose-Capillary: 121 mg/dL — ABNORMAL HIGH (ref 65–99)

## 2016-11-02 LAB — TROPONIN I
Troponin I: <0.03 ng/mL (ref <0.03)
Troponin I: <0.03 ng/mL (ref <0.03)

## 2016-11-02 LAB — PROTIME-INR
INR: 5.4
Prothrombin Time: 50.9 seconds — ABNORMAL HIGH (ref 11.4–15.2)

## 2016-11-02 LAB — ECHOCARDIOGRAM COMPLETE
HEIGHTINCHES: 63 in
Weight: 3135.82 oz

## 2016-11-02 LAB — BRAIN NATRIURETIC PEPTIDE: B NATRIURETIC PEPTIDE 5: 239 pg/mL — AB (ref 0.0–100.0)

## 2016-11-02 MED ORDER — OMEGA-3-ACID ETHYL ESTERS 1 G PO CAPS
1.0000 g | ORAL_CAPSULE | Freq: Every day | ORAL | Status: DC
  Administered 2016-11-03 – 2016-11-06 (×4): 1 g via ORAL
  Filled 2016-11-02 (×4): qty 1

## 2016-11-02 MED ORDER — INSULIN ASPART 100 UNIT/ML ~~LOC~~ SOLN
0.0000 [IU] | Freq: Every day | SUBCUTANEOUS | Status: DC
  Administered 2016-11-03: 3 [IU] via SUBCUTANEOUS

## 2016-11-02 MED ORDER — OXYBUTYNIN CHLORIDE ER 5 MG PO TB24
5.0000 mg | ORAL_TABLET | Freq: Every day | ORAL | Status: DC
  Administered 2016-11-02 – 2016-11-05 (×4): 5 mg via ORAL
  Filled 2016-11-02 (×4): qty 1

## 2016-11-02 MED ORDER — INSULIN GLARGINE 100 UNIT/ML ~~LOC~~ SOLN
30.0000 [IU] | Freq: Every day | SUBCUTANEOUS | Status: DC
  Administered 2016-11-02 – 2016-11-05 (×4): 30 [IU] via SUBCUTANEOUS
  Filled 2016-11-02 (×7): qty 0.3

## 2016-11-02 MED ORDER — ASPIRIN 81 MG PO CHEW
324.0000 mg | CHEWABLE_TABLET | Freq: Once | ORAL | Status: AC
  Administered 2016-11-02: 324 mg via ORAL
  Filled 2016-11-02: qty 4

## 2016-11-02 MED ORDER — ALBUTEROL SULFATE (2.5 MG/3ML) 0.083% IN NEBU: 3.0000 mL | INHALATION_SOLUTION | Freq: Four times a day (QID) | RESPIRATORY_TRACT | Status: DC | PRN

## 2016-11-02 MED ORDER — SODIUM CHLORIDE 0.9 % IV SOLN: 250.0000 mL | INTRAVENOUS | Status: DC | PRN

## 2016-11-02 MED ORDER — OCUVITE-LUTEIN PO CAPS
1.0000 | ORAL_CAPSULE | Freq: Two times a day (BID) | ORAL | Status: DC
  Administered 2016-11-02 – 2016-11-06 (×8): 1 via ORAL
  Filled 2016-11-02 (×8): qty 1

## 2016-11-02 MED ORDER — MORPHINE SULFATE (PF) 2 MG/ML IV SOLN
2.0000 mg | INTRAVENOUS | Status: AC | PRN
  Administered 2016-11-04 (×2): 2 mg via INTRAVENOUS
  Filled 2016-11-02 (×2): qty 1

## 2016-11-02 MED ORDER — ACETAMINOPHEN 500 MG PO TABS: 1000.0000 mg | ORAL_TABLET | Freq: Four times a day (QID) | ORAL | Status: DC | PRN

## 2016-11-02 MED ORDER — NITROGLYCERIN 0.4 MG SL SUBL
0.4000 mg | SUBLINGUAL_TABLET | SUBLINGUAL | Status: DC | PRN
  Administered 2016-11-02: 0.4 mg via SUBLINGUAL
  Filled 2016-11-02: qty 1

## 2016-11-02 MED ORDER — ONDANSETRON HCL 4 MG/2ML IJ SOLN
4.0000 mg | Freq: Four times a day (QID) | INTRAMUSCULAR | Status: DC | PRN
  Administered 2016-11-04 – 2016-11-05 (×2): 4 mg via INTRAVENOUS
  Filled 2016-11-02 (×2): qty 2

## 2016-11-02 MED ORDER — TRAMADOL HCL 50 MG PO TABS
50.0000 mg | ORAL_TABLET | Freq: Four times a day (QID) | ORAL | Status: DC | PRN
Start: 2016-11-02 — End: 2016-11-06
  Administered 2016-11-04: 50 mg via ORAL
  Filled 2016-11-02: qty 1

## 2016-11-02 MED ORDER — ATORVASTATIN CALCIUM 40 MG PO TABS
40.0000 mg | ORAL_TABLET | Freq: Every day | ORAL | Status: DC
Start: 2016-11-02 — End: 2016-11-06
  Administered 2016-11-02 – 2016-11-05 (×4): 40 mg via ORAL
  Filled 2016-11-02 (×4): qty 1

## 2016-11-02 MED ORDER — VITAMIN D 1000 UNITS PO TABS
1000.0000 [IU] | ORAL_TABLET | Freq: Every morning | ORAL | Status: DC
  Administered 2016-11-03 – 2016-11-06 (×4): 1000 [IU] via ORAL
  Filled 2016-11-02 (×4): qty 1

## 2016-11-02 MED ORDER — FENOFIBRATE 160 MG PO TABS
160.0000 mg | ORAL_TABLET | Freq: Every day | ORAL | Status: DC
  Administered 2016-11-03 – 2016-11-06 (×4): 160 mg via ORAL
  Filled 2016-11-02 (×4): qty 1

## 2016-11-02 MED ORDER — ASPIRIN EC 81 MG PO TBEC
81.0000 mg | DELAYED_RELEASE_TABLET | Freq: Every morning | ORAL | Status: DC
  Administered 2016-11-03 – 2016-11-06 (×4): 81 mg via ORAL
  Filled 2016-11-02 (×4): qty 1

## 2016-11-02 MED ORDER — PANTOPRAZOLE SODIUM 40 MG PO TBEC
40.0000 mg | DELAYED_RELEASE_TABLET | Freq: Every day | ORAL | Status: DC
  Administered 2016-11-03 – 2016-11-06 (×4): 40 mg via ORAL
  Filled 2016-11-02 (×4): qty 1

## 2016-11-02 MED ORDER — INSULIN ASPART 100 UNIT/ML ~~LOC~~ SOLN
0.0000 [IU] | Freq: Three times a day (TID) | SUBCUTANEOUS | Status: DC
  Administered 2016-11-02 (×2): 2 [IU] via SUBCUTANEOUS
  Administered 2016-11-03: 3 [IU] via SUBCUTANEOUS
  Administered 2016-11-03 – 2016-11-04 (×2): 2 [IU] via SUBCUTANEOUS
  Administered 2016-11-04 (×2): 3 [IU] via SUBCUTANEOUS
  Administered 2016-11-05 (×3): 2 [IU] via SUBCUTANEOUS
  Administered 2016-11-06: 3 [IU] via SUBCUTANEOUS
  Administered 2016-11-06: 2 [IU] via SUBCUTANEOUS

## 2016-11-02 MED ORDER — REGADENOSON 0.4 MG/5ML IV SOLN
0.4000 mg | Freq: Once | INTRAVENOUS | Status: AC
  Administered 2016-11-03: 0.4 mg via INTRAVENOUS
  Filled 2016-11-02: qty 5

## 2016-11-02 MED ORDER — ACETAMINOPHEN 325 MG PO TABS: 650.0000 mg | ORAL_TABLET | ORAL | Status: DC | PRN

## 2016-11-02 MED ORDER — CARVEDILOL 12.5 MG PO TABS
25.0000 mg | ORAL_TABLET | Freq: Two times a day (BID) | ORAL | Status: DC
  Administered 2016-11-02 – 2016-11-06 (×8): 25 mg via ORAL
  Filled 2016-11-02 (×8): qty 2

## 2016-11-02 MED ORDER — FUROSEMIDE 10 MG/ML IJ SOLN
40.0000 mg | Freq: Two times a day (BID) | INTRAMUSCULAR | Status: DC
  Administered 2016-11-02 – 2016-11-03 (×3): 40 mg via INTRAVENOUS
  Filled 2016-11-02 (×3): qty 4

## 2016-11-02 MED ORDER — LORATADINE 10 MG PO TABS
10.0000 mg | ORAL_TABLET | Freq: Every day | ORAL | Status: DC
Start: 2016-11-02 — End: 2016-11-06
  Administered 2016-11-03 – 2016-11-06 (×4): 10 mg via ORAL
  Filled 2016-11-02 (×4): qty 1

## 2016-11-02 MED ORDER — SODIUM CHLORIDE 0.9% FLUSH: 3.0000 mL | INTRAVENOUS | Status: DC | PRN

## 2016-11-02 MED ORDER — SODIUM CHLORIDE 0.9% FLUSH
3.0000 mL | Freq: Two times a day (BID) | INTRAVENOUS | Status: DC
  Administered 2016-11-02 – 2016-11-06 (×8): 3 mL via INTRAVENOUS

## 2016-11-02 MED ORDER — FERROUS SULFATE 325 (65 FE) MG PO TABS
325.0000 mg | ORAL_TABLET | Freq: Every day | ORAL | Status: DC
  Administered 2016-11-03 – 2016-11-06 (×4): 325 mg via ORAL
  Filled 2016-11-02 (×4): qty 1

## 2016-11-02 MED ORDER — AMLODIPINE BESYLATE 5 MG PO TABS
10.0000 mg | ORAL_TABLET | Freq: Every morning | ORAL | Status: DC
  Administered 2016-11-03 – 2016-11-06 (×4): 10 mg via ORAL
  Filled 2016-11-02 (×4): qty 2

## 2016-11-02 NOTE — Progress Notes (Signed)
*  PRELIMINARY RESULTS* Echocardiogram 2D Echocardiogram has been performed.  Leavy Cella 11/02/2016, 3:32 PM

## 2016-11-02 NOTE — Consult Note (Signed)
Cardiology Consultation:   Patient ID: Summer Wong; [id number]; 1925-08-23   Admit date: 11/02/2016 Date of Consult: 11/02/2016  Primary Care Provider: [facility] Primary Cardiologist: Jacques Earthly, MD [facility])  Patient Profile:   Summer Wong is an 83 y.o. female with a history of persistent atrial fibrillation on Coumadin, sick sinus syndrome status post pacemaker placement, chronic diastolic heart failure, and previous silent myocardial infarction with negative ischemic workup in 2015 who is being seen today for the evaluation of chest pain at the request of Dr. Carles Collet.  History of Present Illness:   [name] follows regularly with Dr. Gennette Pac of the cardiology division at [facility], she is seen in the [place] clinic, in fact just had a recent visit in late June - I reviewed the note and updated her chart. She presents to [facility] complaining of recent onset left arm and chest tightness, began yesterday and prompted use of nitroglycerin which relieved symptoms after 3 doses. She had a recurrence of discomfort this morning and was urged to come to the hospital by her son. She otherwise does not report any recurring episodes of chest discomfort, has not used nitroglycerin in quite some time. She states that her weight has been stable around 195 pounds, she has chronic leg edema which has not worsened recently. She reports compliance with her medications, no recent major dietary indiscretions.  She was seen at this facility in consultation by Dr. Bronson Ing in June of last year, had an echocardiogram done at that time which is outlined below. She was treated for diastolic heart failure exacerbation.  Last ischemic evaluation through [facility] was via [place] myocardial PET imaging in 2015, no active ischemic territories at that time.  Last pacemaker interrogation in May of this year indicated persistent atrial fibrillation. She does not endorse  any palpitations. She is on Coumadin for stroke prophylaxis.  Past Medical History:  Diagnosis Date  . Arthritis   . Atrial fibrillation ([place])    Status post cardioversion in January 2016  . Baker's cyst   . Chronic diastolic heart failure ([place])   . COPD (chronic obstructive pulmonary disease) ([place])   . Essential hypertension   . GERD (gastroesophageal reflux disease)   . Heart murmur   . History of hepatitis A   . Hyperlipemia   . Macular degeneration   . OSA on CPAP   . Pacemaker 2007   [employer] - sick sinus syndrome  . Sick sinus syndrome ([place]) 2007  . Silent myocardial infarction ([place]) 2006  . Stress incontinence   . TB lung, latent   . Type 2 diabetes mellitus ([place])     Past Surgical History:  Procedure Laterality Date  . BLADDER SUSPENSION    . CHOLECYSTECTOMY    . PACEMAKER INSERTION    . ROBOTIC ASSISTED TOTAL HYSTERECTOMY WITH BILATERAL SALPINGO OOPHERECTOMY Bilateral 08/16/2014   Procedure:  ROBOTIC ASSISTED TOTAL HYSTERECTOMY WITH BILATERAL SALPINGO OOPHORECTOMY AND LYMPHADENECTOMY;  Surgeon: Everitt Amber, MD;  Location: [facility];  Service: Gynecology;  Laterality: Bilateral;  . TUBAL LIGATION       Inpatient Medications: Scheduled Meds: . amLODipine  10 mg Oral q morning - 10a  . aspirin EC  81 mg Oral q morning - 10a  . atorvastatin  40 mg Oral QHS  . carvedilol  25 mg Oral BID WC  . cholecalciferol  1,000 Units Oral q morning - 10a  . fenofibrate  160 mg Oral Daily  . [  START ON 11/03/2016] ferrous sulfate  325 mg Oral Q breakfast  . furosemide  40 mg Intravenous BID  . insulin aspart  0-15 Units Subcutaneous TID WC  . insulin aspart  0-5 Units Subcutaneous QHS  . insulin glargine  30 Units Subcutaneous QHS  . loratadine  10 mg Oral Daily  . multivitamin-lutein  1 capsule Oral BID  . omega-3 acid ethyl esters  1 g Oral Daily  . oxybutynin  5 mg Oral QHS  . pantoprazole  40 mg Oral Daily  . sodium chloride flush  3 mL Intravenous Q12H   Continuous  Infusions: . sodium chloride     PRN Meds: sodium chloride, acetaminophen, albuterol, morphine injection, nitroGLYCERIN, ondansetron (ZOFRAN) IV, sodium chloride flush, traMADol  Allergies:    Allergies  Allergen Reactions  . Enalapril Maleate Swelling    Throat swelling  . Sulfa Antibiotics Other (See Comments)    Does not remember.   Michail Sermon [Enalaprilat] Swelling    Tongue.     Social History:   Social History   Social History  . Marital status: Widowed    Spouse name: N/A  . Number of children: 2  . Years of education: N/A   Occupational History  . Not on file.   Social History Main Topics  . Smoking status: Never Smoker  . Smokeless tobacco: Never Used  . Alcohol use No  . Drug use: No  . Sexual activity: No   Other Topics Concern  . Not on file   Social History Narrative  . No narrative on file    Family History:   The patient's family history includes Colon cancer in her brother; Stomach cancer in her father.  ROS:  Please see the history of present illness.  ROS  She reports chronic leg swelling, not worse recently. All other ROS reviewed and negative.     Physical Exam/Data:   Vitals:   11/02/16 0930 11/02/16 1000 11/02/16 1030 11/02/16 1116  BP: (!) 166/78 139/73 131/68 (!) 149/63  Pulse: 82 81 80 82  Resp: (!) 22 19 15 20   Temp:    97.7 F (36.5 C)  TempSrc:    Oral  SpO2: 95% 92% 95% 93%  Weight:    195 lb 15.8 oz (88.9 kg)  Height:    5\' 3"  (1.6 m)    Intake/Output Summary (Last 24 hours) at 11/02/16 1519 Last data filed at 11/02/16 1214  Gross per 24 hour  Intake               50 ml  Output              600 ml  Net             -550 ml   Filed Weights   11/02/16 0825 11/02/16 1116  Weight: 194 lb (88 kg) 195 lb 15.8 oz (88.9 kg)   Body mass index is 34.72 kg/m.  Gen.: Obese elderly woman in no distress. HEENT: Conjunctiva and lids normal, oropharynx clear. Neck: Supple, no elevated JVP or carotid bruits, no  thyromegaly. Lungs: Diminished breath sounds without crackles or wheezing, nonlabored breathing at rest. Cardiac: Irregular rhythm, no S3, soft systolic murmur, no pericardial rub. Abdomen: Soft, nontender, bowel sounds present, no guarding or rebound. Extremities: Chronic appearing lower leg edema, distal pulses 1-2+. Skin: Warm and dry. Musculoskeletal: No kyphosis. Neuropsychiatric: Alert and oriented x3, affect grossly appropriate.   EKG:  Tracing from 11/02/2016 personally reviewed and shows rate-controlled atrial fibrillation  with intermittent ventricular pacing, poor R-wave progression and nonspecific ST changes.  Telemetry:  Telemetry was personally reviewed and demonstrates: Atrial fibrillation with intermittent ventricular pacing.  Relevant CV Studies:  Lexiscan myocardial PET imaging 03/06/2014 [facility]): Impressions: - No significant ischemia is noted on perfusion imaging - During stress: Global systolic function is normal. The ejection fraction  was greater than 65%. The RV is dilated. - Mild coronary calcifications are noted  Echocardiogram 10/16/2015: Study Conclusions  - Left ventricle: The cavity size was normal. Wall thickness was   normal. Systolic function was normal. The estimated ejection   fraction was in the range of 60% to 65%. Wall motion was normal;   there were no regional wall motion abnormalities. Features are   consistent with a pseudonormal left ventricular filling pattern,   with concomitant abnormal relaxation and increased filling   pressure (grade 2 diastolic dysfunction). Doppler parameters are   consistent with high ventricular filling pressure. - Aortic valve: Mildly to moderately calcified annulus. Trileaflet.   There was mild stenosis. Peak velocity (S): 244 cm/s. Mean   gradient (S): 12 mm Hg. Valve area (VTI): 1.36 cm^2. Valve area   (Vmax): 1.59 cm^2. Valve area (Vmean): 1.58 cm^2. - Mitral valve: Mildly calcified annulus. Mildly  thickened, mildly   calcified leaflets . There was moderate regurgitation. - Left atrium: The atrium was mildly to moderately dilated. - Right ventricle: Pacer wire or catheter noted in right ventricle. - Right atrium: The atrium was mildly dilated. Pacer wire or   catheter noted in right atrium. - Tricuspid valve: There was moderate regurgitation. - Pulmonary arteries: PA peak pressure: 47 mm Hg (S).  Laboratory Data:  Chemistry  Recent Labs Lab 11/02/16 0827  NA 141  K 3.8  CL 102  CO2 30  GLUCOSE 163*  BUN 36*  CREATININE 1.55*  CALCIUM 9.3  GFRNONAA 29*  GFRAA 33*  ANIONGAP 9    No results for input(s): PROT, ALBUMIN, AST, ALT, ALKPHOS, BILITOT in the last 168 hours. Hematology  Recent Labs Lab 11/02/16 0827  WBC 8.6  RBC 4.68  HGB 12.6  HCT 39.1  MCV 83.5  MCH 26.9  MCHC 32.2  RDW 15.2  PLT 227   Cardiac Enzymes  Recent Labs Lab 11/02/16 0827 11/02/16 1207  TROPONINI <0.03 <0.03   No results for input(s): TROPIPOC in the last 168 hours.  BNP  Recent Labs Lab 11/02/16 1207  BNP 239.0*    Radiology/Studies:  Dg Chest Port 1 View  Result Date: 11/02/2016 CLINICAL DATA:  Chest pain EXAM: PORTABLE CHEST 1 VIEW COMPARISON:  02/27/2016 FINDINGS: Heart is borderline in size. Mild peribronchial thickening and interstitial prominence. No confluent opacity or effusion. Left pacer is unchanged. IMPRESSION: Borderline heart size. Mild peribronchial thickening and interstitial prominence could reflect bronchitis or early interstitial edema. Electronically Signed   By: Rolm Baptise M.D.   On: 11/02/2016 08:47    Assessment and Plan:   1. Chest pain concerning for angina, recent onset and relief with nitroglycerin. Troponin I levels argue against ACS however, ECG is nonspecific showing atrial fibrillation with intermittent ventricular pacing and overall nonspecific ST/T changes. She has a history of reported silent myocardial infarction back in 2006, and  underwent a negative myocardial PET study in 2015 at [facility]. She has not had interval ischemic testing.  2. Chronic diastolic heart failure, LVEF 60-65% with grade 2 diastolic function by echocardiogram last year. Weight is relatively stable, she has chronic leg edema. Chest x-ray  raises possibility of early interstitial edema. She reports compliance with her medications including diuretics.  3. Persistent atrial fibrillation, heart rate looks to be adequately controlled on present regimen and she is on Coumadin for stroke prophylaxis. INR currently supratherapeutic at 5.4. No reported bleeding episodes.  4. History of sick sinus syndrome status post [employer]. Jude pacemaker placement in 2007. She [facility] at [facility].  Current cardiac regimen includes Coreg, Norvasc, and Lipitor. Agree with transition to IV Lasix for gentle diuresis. We will follow-up on her repeat echocardiogram which has been ordered for today to ensure stability in LVEF. Otherwise would anticipate a Lexiscan Myoview which we will tentatively schedule for tomorrow for repeat ischemic evaluation. Coumadin per pharmacy consultation. Follow-up BMET in a.m.  Signed, Rozann Lesches, MD  11/02/2016 3:19 PM

## 2016-11-02 NOTE — ED Provider Notes (Signed)
[facility] DEPT Provider Note   CSN: [id number] Arrival date & time: 11/02/16  0816     History   Chief Complaint Chief Complaint  Patient presents with  . Chest Pain    HPI Summer Wong is a 83 y.o. female.  HPI  Pt was seen at [place]. Per pt, c/o gradual onset and persistence of constant chest "pain" that began this morning approximately 6am. Pt describes the CP as "tightness" in her left chest, radiating down her left arm. Has been associated with SOB, generalized weakness. Pt did not take her own ASA or SL ntg this morning PTA. Pt states she had an episode of similar symptoms last night at 1800, which resolved after she took her own SL ntg x3. Denies palpitations, no cough, no N/V/D, no abd pain, no back pain.    Past Medical History:  Diagnosis Date  . Arthritis   . Atrial fibrillation ([place])   . Baker's cyst   . Bradycardia   . CHF (congestive heart failure) ([place])   . COPD (chronic obstructive pulmonary disease) ([place])   . Diabetes mellitus without complication ([place])   . GERD (gastroesophageal reflux disease)   . Heart murmur   . Hepatitis    hx of Hep A - years ago   . Hyperlipemia   . Hypertension   . Presence of permanent cardiac pacemaker   . Shortness of breath dyspnea    with exertion   . Sleep apnea    cpap - patient does not know settings   . Stress incontinence     Patient Active Problem List   Diagnosis Date Noted  . CHF exacerbation ([place]) 02/27/2016  . Essential hypertension 02/27/2016  . Acute renal failure (ARF) ([place])   . Acute on chronic congestive heart failure ([place])   . Acute renal failure ([place])   . ARF (acute renal failure) ([place]) 10/16/2015  . CHF (congestive heart failure) ([place]) 10/15/2015  . CKD (chronic kidney disease) stage 3, GFR 30-59 ml/min 10/15/2015  . Endometrial cancer ([place]) 08/16/2014  . FIGO stage II endometrial cancer ([place]) 07/11/2014  . Vulvovaginal candidiasis 07/11/2014  . Cutaneous candidiasis 07/11/2014  . Inactive  tuberculosis of lung 03/08/2014  . Diabetes ([place]) 03/01/2014  . Arthritis, degenerative 03/01/2014  . Breath shortness 03/01/2014  . A-fib ([place]) 10/13/2012  . Arteriosclerosis of coronary artery 10/13/2012  . BP (high blood pressure) 10/13/2012  . Obstructive apnea 10/13/2012  . Sick sinus syndrome ([place]) 10/13/2012    Past Surgical History:  Procedure Laterality Date  . BLADDER SUSPENSION    . CHOLECYSTECTOMY    . PACEMAKER INSERTION    . ROBOTIC ASSISTED TOTAL HYSTERECTOMY WITH BILATERAL SALPINGO OOPHERECTOMY Bilateral 08/16/2014   Procedure:  ROBOTIC ASSISTED TOTAL HYSTERECTOMY WITH BILATERAL SALPINGO OOPHORECTOMY AND LYMPHADENECTOMY;  Surgeon: Everitt Amber, MD;  Location: [facility];  Service: Gynecology;  Laterality: Bilateral;  . TUBAL LIGATION      OB History    Gravida Para Term Preterm AB Living                 SAB TAB Ectopic Multiple Live Births           2       Home Medications    Prior to Admission medications   Medication Sig Start Date End Date Taking? Authorizing Provider  acetaminophen (TYLENOL) 500 MG tablet Take 1,000 mg by mouth every 6 (six) hours as needed for moderate pain or headache.    [provider]  albuterol (PROVENTIL  HFA;VENTOLIN HFA) 108 (90 BASE) MCG/ACT inhaler Inhale 1-2 puffs into the lungs every 6 (six) hours as needed for wheezing or shortness of breath.    [provider]  amLODipine (NORVASC) 10 MG tablet Take 10 mg by mouth every morning.     [provider]  aspirin EC 81 MG tablet Take 81 mg by mouth every morning.    [provider]  atorvastatin (LIPITOR) 40 MG tablet Take 40 mg by mouth at bedtime.    [provider]  carvedilol (COREG) 25 MG tablet Take 25 mg by mouth 2 (two) times daily with a meal.    [provider]  cetirizine (ZYRTEC) 10 MG tablet Take 10 mg by mouth every morning.     [provider]  cholecalciferol (VITAMIN D) 1000 UNITS tablet Take 1,000 Units by  mouth every morning.     [provider]  fenofibrate (TRICOR) 145 MG tablet Take 145 mg by mouth every morning.    [provider]  ferrous sulfate 325 (65 FE) MG tablet Take 325 mg by mouth daily with breakfast.    [provider]  furosemide (LASIX) 20 MG tablet Take 1 tablet (20 mg total) by mouth daily. 02/29/16   Kathie Dike, MD  insulin detemir (LEVEMIR) 100 UNIT/ML injection Inject 43 Units into the skin at bedtime.    [provider]  Multiple Vitamins-Minerals (PRESERVISION AREDS) CAPS Take 1 capsule by mouth 2 (two) times daily.    [provider]  nitroGLYCERIN (NITROSTAT) 0.4 MG SL tablet Place 0.4 mg under the tongue every 5 (five) minutes as needed for chest pain.    [provider]  Omega-3 Fatty Acids (FISH OIL) 1000 MG CAPS Take 1,000 mg by mouth daily.     [provider]  omeprazole (PRILOSEC) 20 MG capsule Take 20 mg by mouth every morning.     [provider]  oxybutynin (DITROPAN-XL) 5 MG 24 hr tablet Take 5 mg by mouth at bedtime.    [provider]  traMADol (ULTRAM) 50 MG tablet Take 1 tablet (50 mg total) by mouth every 6 (six) hours as needed for moderate pain. 08/17/14   Everitt Amber, MD  warfarin (COUMADIN) 4 MG tablet Take 4 mg by mouth daily.    [provider]    Family History Family History  Problem Relation Age of Onset  . Stomach cancer Father   . Colon cancer Brother     Social History Social History  Substance Use Topics  . Smoking status: Never Smoker  . Smokeless tobacco: Never Used  . Alcohol use No     Allergies   Enalapril maleate; Sulfa antibiotics; and Vasotec [enalaprilat]   Review of Systems Review of Systems ROS: Statement: All systems negative except as marked or noted in the HPI; Constitutional: Negative for fever and chills. +generalized weakness.; ; Eyes: Negative for eye pain, redness and discharge. ; ; ENMT: Negative for ear pain,  hoarseness, nasal congestion, sinus pressure and sore throat. ; ; Cardiovascular: +CP, SOB. Negative for palpitations, diaphoresis, and peripheral edema. ; ; Respiratory: Negative for cough, wheezing and stridor. ; ; Gastrointestinal: Negative for nausea, vomiting, diarrhea, abdominal pain, blood in stool, hematemesis, jaundice and rectal bleeding. . ; ; Genitourinary: Negative for dysuria, flank pain and hematuria. ; ; Musculoskeletal: Negative for back pain and neck pain. Negative for swelling and trauma.; ; Skin: Negative for pruritus, rash, abrasions, blisters, bruising and skin lesion.; ; Neuro: Negative for headache,  lightheadedness and neck stiffness. Negative for altered level of consciousness, altered mental status, extremity weakness, paresthesias, involuntary movement, seizure and syncope.       Physical Exam Updated Vital Signs BP (!) 153/76 (BP Location: Right Arm)   Pulse 83   Resp 20   Ht 5\' 3"  (1.6 m)   Wt 88 kg (194 lb)   SpO2 95%   BMI 34.37 kg/m   Physical Exam 0825: Physical examination:  Nursing notes reviewed; Vital signs and O2 SAT reviewed;  Constitutional: Well developed, Well nourished, Well hydrated, In no acute distress; Head:  Normocephalic, atraumatic; Eyes: EOMI, PERRL, No scleral icterus; ENMT: Mouth and pharynx normal, Mucous membranes moist; Neck: Supple, Full range of motion, No lymphadenopathy; Cardiovascular: Irregular rate and rhythm, No gallop; Respiratory: Breath sounds clear & equal bilaterally, No wheezes.  Speaking full sentences with ease, Normal respiratory effort/excursion; Chest: Nontender, Movement normal; Abdomen: Soft, Nontender, Nondistended, Normal bowel sounds; Genitourinary: No CVA tenderness; Extremities: Pulses normal, No tenderness, No edema, No calf edema or asymmetry.; Neuro: AA&Ox3, Major CN grossly intact.  Speech clear. No gross focal motor or sensory deficits in extremities.; Skin: Color normal, Warm, Dry.; Psych:  Anxious.   ED  Treatments / Results  Labs (all labs ordered are listed, but only abnormal results are displayed)   EKG  EKG Interpretation  Date/Time:  Monday November 02 2016 08:25:43 EDT Ventricular Rate:  80 PR Interval:    QRS Duration: 171 QT Interval:  450 QTC Calculation: 520 R Axis:   -83 Text Interpretation:  Atrial fibrillation Nonspecific IVCD with LAD LVH with secondary repolarization abnormality Baseline wander Artifact When compared with ECG of 02/27/2016 Artifact is present  pacer spikes not well seen Confirmed by [facility]  MD, Nunzio Cory [phone]) on 11/02/2016 8:35:35 AM       EKG Interpretation  Date/Time:  Monday November 02 2016 08:28:41 EDT Ventricular Rate:  83 PR Interval:    QRS Duration: 173 QT Interval:  475 QTC Calculation: 548 R Axis:   -82 Text Interpretation:  Atrial fibrillation ventricular-paced complexes Premature ventricular complexes Nonspecific IVCD with LAD Left ventricular hypertrophy Anterior infarct, old Since last tracing of earlier today ventricular-paced complexes are present Confirmed by [facility]  MD, Nunzio Cory [phone]) on 11/02/2016 8:39:55 AM       EKG Interpretation  Date/Time:  Monday November 02 2016 08:31:20 EDT Ventricular Rate:  82 PR Interval:    QRS Duration: 166 QT Interval:  479 QTC Calculation: 553 R Axis:   -82 Text Interpretation:  Atrial fibrillation ventricular-paced complexes Nonspecific IVCD with LAD Left ventricular hypertrophy Anterior infarct, old Since last tracing of earlier today No significant change was found Confirmed by [facility]  MD, Nunzio Cory [phone]) on 11/02/2016 8:41:30 AM          Radiology   Procedures Procedures (including critical care time)  Medications Ordered in ED Medications  nitroGLYCERIN (NITROSTAT) SL tablet 0.4 mg (0.4 mg Sublingual Given 11/02/16 0832)  morphine 2 MG/ML injection 2 mg (not administered)  aspirin chewable tablet 324 mg (324 mg Oral Given 11/02/16 7989)     Initial Impression / Assessment and Plan  / ED Course  I have reviewed the triage vital signs and the nursing notes.  Pertinent labs & imaging results that were available during my care of the patient were reviewed by me and considered in my medical decision making (see chart for details).  MDM Reviewed: previous chart, nursing note and vitals Reviewed previous: labs and ECG Interpretation: labs, ECG and x-ray  Results for orders placed or performed during the hospital encounter of 57/84/69  Basic metabolic panel  Result Value Ref Range   Sodium 141 135 - 145 mmol/L   Potassium 3.8 3.5 - 5.1 mmol/L   Chloride 102 101 - 111 mmol/L   CO2 30 22 - 32 mmol/L   Glucose, Bld 163 (H) 65 - 99 mg/dL   BUN 36 (H) 6 - 20 mg/dL   Creatinine, Ser 1.55 (H) 0.44 - 1.00 mg/dL   Calcium 9.3 8.9 - 10.3 mg/dL   GFR calc non Af Amer 29 (L) >60 mL/min   GFR calc Af Amer 33 (L) >60 mL/min   Anion gap 9 5 - 15  Troponin I  Result Value Ref Range   Troponin I <0.03 <0.03 ng/mL  CBC with Differential  Result Value Ref Range   WBC 8.6 4.0 - 10.5 K/uL   RBC 4.68 3.87 - 5.11 MIL/uL   Hemoglobin 12.6 12.0 - 15.0 g/dL   HCT 39.1 36.0 - 46.0 %   MCV 83.5 78.0 - 100.0 fL   MCH 26.9 26.0 - 34.0 pg   MCHC 32.2 30.0 - 36.0 g/dL   RDW 15.2 11.5 - 15.5 %   Platelets 227 150 - 400 K/uL   Neutrophils Relative % 62 %   Neutro Abs 5.2 1.7 - 7.7 K/uL   Lymphocytes Relative 27 %   Lymphs Abs 2.3 0.7 - 4.0 K/uL   Monocytes Relative 8 %   Monocytes Absolute 0.7 0.1 - 1.0 K/uL   Eosinophils Relative 3 %   Eosinophils Absolute 0.3 0.0 - 0.7 K/uL   Basophils Relative 0 %   Basophils Absolute 0.0 0.0 - 0.1 K/uL  Protime-INR  Result Value Ref Range   Prothrombin Time 50.9 (H) 11.4 - 15.2 seconds   INR 5.40 (HH)    Dg Chest Port 1 View Result Date: 11/02/2016 CLINICAL DATA:  Chest pain EXAM: PORTABLE CHEST 1 VIEW COMPARISON:  02/27/2016 FINDINGS: Heart is borderline in size. Mild peribronchial thickening and interstitial prominence. No confluent  opacity or effusion. Left pacer is unchanged. IMPRESSION: Borderline heart size. Mild peribronchial thickening and interstitial prominence could reflect bronchitis or early interstitial edema. Electronically Signed   By: [place]m Baptise M.D.   On: 11/02/2016 08:47    0835:  Pt anxious and tremulous on exam, EKG repeated due to artifact. Pt with known pacer, afib as underlying rhythm; non-paced complexes appear similar to EKG dated 02/27/2016.  0925:  ASA and SL ntg x1 given with complete resolution of symptoms. Troponin negative.  T/C to [facility] Dr. Carles Collet, case discussed, including:  HPI, pertinent PM/SHx, VS/PE, dx testing, ED course and treatment:  Agreeable to admit, he will come to the ED for evaluation.      Final Clinical Impressions(s) / ED Diagnoses   Final diagnoses:  None    New Prescriptions New Prescriptions   No medications on file     Francine Graven, DO 11/06/16 1543

## 2016-11-02 NOTE — H&P (Signed)
History and Physical  Summer Wong BBC:[id number] DOB: 07-14-1925 DOA: 11/02/2016   PCP: [facility]   Patient coming from: Home  Chief Complaint: sob, chest tightness  HPI:  83 year old female with a history of diastolic CHF, sick sinus syndrome status post pacemaker, atrial fibrillation, diabetes mellitus, hyperlipidemia, hypertension presents with one-day history of chest discomfort and shortness of breath. Around 6 PM on 11/01/2016, the patient developed chest discomfort down her left arm with associated shortness of breath while watching television. The patient took 3 nitroglycerin after which it improved. On the morning of 11/02/2016, the patient had chest discomfort once again with some shortness of breath. As a result, she presents to emergency department for further evaluation. During this past week, she has not had to use any nitroglycerin. She denies any fevers, chills, nausea, vomiting, diarrhea, coughing, hemoptysis, abdominal pain, dysuria, hematuria. She states that her weight has been stable with her maximum weight 195.6 pounds this past week. She endorses some dietary indiscretion. She states that she has been compliant with her furosemide 40 mg daily. She feels that her lower extremity edema is about the same as usual.  She follows cardiology, Dr. Gennette Pac at [facility]. Her last appointment on 10/15/2016, the patient had a weight of 196 pounds. The patient was admitted to the hospital from 02/27/2016 through 02/29/2016 with acute on chronic diastolic CHF. Her discharge weight was 192 pounds.  In the emergency department, the patient was afebrile and hemodynamically stable saturating 95% on room air. She was given one nitroglycerin with improvement of her chest discomfort. Serum creatinine was at baseline of 1.55. CBC was unremarkable. INR was 5.40. Chest x-ray showed mild peribronchial thickening    Assessment/Plan: Acute on chronic diastolic  CHF -daily weights -accurate I/O's -fluid restrict -Echo -continue IV Lasix -Clinically, the patient appears to be fluid overloaded, +JVD  Chest pain -Has typical and atypical components -Finish cycling troponins -EKG unchanged from previous EKGs -Continue aspirin  CKD stage 3 -baseline creatinine 1.3-1.5 -monitor with diuresis  Essential hypertension -Continue amlodipine, carvedilol  Diabetes mellitus type 2 -check hemoglobin A1c -Start reduced dose Lantus -NovoLog sliding scale  Hyperlipidemia -Continue statin, fenofibrate, and fish oil  Atrial fibrillation -Rate controlled -Continue warfarin -Continue carvedilol  Supratherapeutic INR -As patient is hemodynamically stable and her hemoglobin is at baseline without any objective signs of bleeding--> allow the INR to drift downward  SSS -s/p PPM         Past Medical History:  Diagnosis Date  . Arthritis   . Atrial fibrillation ([place])   . Baker's cyst   . Bradycardia   . CHF (congestive heart failure) ([place])   . COPD (chronic obstructive pulmonary disease) ([place])   . Diabetes mellitus without complication ([place])   . GERD (gastroesophageal reflux disease)   . Heart murmur   . Hepatitis    hx of Hep A - years ago   . Hyperlipemia   . Hypertension   . Presence of permanent cardiac pacemaker   . Shortness of breath dyspnea    with exertion   . Sleep apnea    cpap - patient does not know settings   . Stress incontinence    Past Surgical History:  Procedure Laterality Date  . BLADDER SUSPENSION    . CHOLECYSTECTOMY    . PACEMAKER INSERTION    . ROBOTIC ASSISTED TOTAL HYSTERECTOMY WITH BILATERAL SALPINGO OOPHERECTOMY Bilateral 08/16/2014   Procedure:  ROBOTIC ASSISTED TOTAL HYSTERECTOMY WITH BILATERAL  SALPINGO OOPHORECTOMY AND LYMPHADENECTOMY;  Surgeon: Everitt Amber, MD;  Location: [facility];  Service: Gynecology;  Laterality: Bilateral;  . TUBAL LIGATION     Social History:  reports that she has never  smoked. She has never used smokeless tobacco. She reports that she does not drink alcohol or use drugs.   Family History  Problem Relation Age of Onset  . Stomach cancer Father   . Colon cancer Brother      Allergies  Allergen Reactions  . Enalapril Maleate Swelling    Throat swelling  . Sulfa Antibiotics Other (See Comments)    Does not remember.   Michail Sermon [Enalaprilat] Swelling    Tongue.      Prior to Admission medications   Medication Sig Start Date End Date Taking? Authorizing Provider  albuterol (PROVENTIL HFA;VENTOLIN HFA) 108 (90 BASE) MCG/ACT inhaler Inhale 1-2 puffs into the lungs every 6 (six) hours as needed for wheezing or shortness of breath.   Yes [provider]  amLODipine (NORVASC) 10 MG tablet Take 10 mg by mouth every morning.    Yes [provider]  aspirin EC 81 MG tablet Take 81 mg by mouth every morning.   Yes [provider]  atorvastatin (LIPITOR) 40 MG tablet Take 40 mg by mouth at bedtime.   Yes [provider]  carvedilol (COREG) 25 MG tablet Take 25 mg by mouth 2 (two) times daily with a meal.   Yes [provider]  cetirizine (ZYRTEC) 10 MG tablet Take 10 mg by mouth every morning.    Yes [provider]  cholecalciferol (VITAMIN D) 1000 UNITS tablet Take 1,000 Units by mouth every morning.    Yes [provider]  fenofibrate (TRICOR) 145 MG tablet Take 145 mg by mouth every morning.   Yes [provider]  ferrous sulfate 325 (65 FE) MG tablet Take 325 mg by mouth daily with breakfast.   Yes [provider]  furosemide (LASIX) 20 MG tablet Take 1 tablet (20 mg total) by mouth daily. Patient taking differently: Take 40-80 mg by mouth See admin instructions. Take 2 tab a day, and if patient gains weight, takes 1 more tab 02/29/16  Yes Memon, Jolaine Artist, MD  insulin glargine (LANTUS) 100 UNIT/ML injection Inject 43 Units into the skin at bedtime.   Yes [provider]   Multiple Vitamins-Minerals (PRESERVISION AREDS) CAPS Take 1 capsule by mouth 2 (two) times daily.   Yes [provider]  nitroGLYCERIN (NITROSTAT) 0.4 MG SL tablet Place 0.4 mg under the tongue every 5 (five) minutes as needed for chest pain.   Yes [provider]  Omega-3 Fatty Acids (FISH OIL) 1000 MG CAPS Take 1,000 mg by mouth daily.    Yes [provider]  omeprazole (PRILOSEC) 20 MG capsule Take 20 mg by mouth every morning.    Yes [provider]  oxybutynin (DITROPAN-XL) 5 MG 24 hr tablet Take 5 mg by mouth at bedtime.   Yes [provider]  traMADol (ULTRAM) 50 MG tablet Take 1 tablet (50 mg total) by mouth every 6 (six) hours as needed for moderate pain. 08/17/14  Yes Everitt Amber, MD  warfarin (COUMADIN) 4 MG tablet Take 2-4 mg by mouth See admin instructions. Takes 1 tab everyday except Sundays; takes 2 mg (1/2 tab) on Sundays   Yes [provider]  acetaminophen (TYLENOL) 500 MG tablet Take 1,000 mg by mouth every 6 (six) hours as needed for moderate pain or headache.  [provider]    Review of Systems:  Constitutional:  No weight loss, night sweats, Fevers, chills, fatigue.  Head&Eyes: No headache.  No vision loss.  No eye pain or scotoma ENT:  No Difficulty swallowing,Tooth/dental problems,Sore throat,  No ear ache, post nasal drip,  Cardio-vascular:  NoOrthopnea, PND, swelling in lower extremities,  dizziness, palpitations  GI:  No  abdominal pain, nausea, vomiting, diarrhea, loss of appetite, hematochezia, melena, heartburn, indigestion, Resp:  No cough. No coughing up of blood .No wheezing.No chest wall deformity  Skin:  no rash or lesions.  GU:  no dysuria, change in color of urine, no urgency or frequency. No flank pain.  Musculoskeletal:  No joint pain or swelling. No decreased range of motion. No back pain.  Psych:  No change in mood or affect. No depression or anxiety. Neurologic: No headache,  no dysesthesia, no focal weakness, no vision loss. No syncope  Physical Exam: Vitals:   11/02/16 0830 11/02/16 0900 11/02/16 0929 11/02/16 0930  BP: (!) 151/70 127/72  (!) 166/78  Pulse: 80 80  82  Resp: 17 17  (!) 22  Temp:   98.2 F (36.8 C)   TempSrc:   Oral   SpO2: 96% 95%  95%  Weight:      Height:       General:  A&O x 3, NAD, nontoxic, pleasant/cooperative Head/Eye: No conjunctival hemorrhage, no icterus, [place]/AT, No nystagmus ENT:  No icterus,  No thrush, good dentition, no pharyngeal exudate Neck:  No masses, no lymphadenpathy, no bruits+JVD CV:  RRR, no rub, no gallop, no S3 Lung:  Bilateral crackles. No wheezing. Good air movement. Abdomen: soft/NT, +BS, nondistended, no peritoneal signs Ext: No cyanosis, No rashes, No petechiae, No lymphangitis, 1+ LE edema Neuro: CNII-XII intact, strength 4/5 in bilateral upper and lower extremities, no dysmetria  Labs on Admission:  Basic Metabolic Panel:  Recent Labs Lab 11/02/16 0827  NA 141  K 3.8  CL 102  CO2 30  GLUCOSE 163*  BUN 36*  CREATININE 1.55*  CALCIUM 9.3   Liver Function Tests: No results for input(s): AST, ALT, ALKPHOS, BILITOT, PROT, ALBUMIN in the last 168 hours. No results for input(s): LIPASE, AMYLASE in the last 168 hours. No results for input(s): AMMONIA in the last 168 hours. CBC:  Recent Labs Lab 11/02/16 0827  WBC 8.6  NEUTROABS 5.2  HGB 12.6  HCT 39.1  MCV 83.5  PLT 227   Coagulation Profile:  Recent Labs Lab 11/02/16 0827  INR 5.40*   Cardiac Enzymes:  Recent Labs Lab 11/02/16 0827  TROPONINI <0.03   BNP: Invalid input(s): POCBNP CBG: No results for input(s): GLUCAP in the last 168 hours. Urine analysis:    Component Value Date/Time   COLORURINE YELLOW 02/27/2016 [place] 02/27/2016 1522   LABSPEC 1.010 02/27/2016 1522   PHURINE 6.0 02/27/2016 1522   GLUCOSEU NEGATIVE 02/27/2016 1522   HGBUR NEGATIVE 02/27/2016 1522   BILIRUBINUR NEGATIVE  02/27/2016 1522   KETONESUR NEGATIVE 02/27/2016 1522   PROTEINUR 100 (A) 02/27/2016 1522   UROBILINOGEN 0.2 08/30/2014 1838   NITRITE NEGATIVE 02/27/2016 1522   LEUKOCYTESUR NEGATIVE 02/27/2016 1522   Sepsis Labs: @LABRCNTIP (procalcitonin:4,lacticidven:4) )No results found for this or any previous visit (from the past 240 hour(s)).   Radiological Exams on Admission: Dg Chest Port 1 View  Result Date: 11/02/2016 CLINICAL DATA:  Chest pain EXAM: PORTABLE CHEST 1 VIEW COMPARISON:  02/27/2016 FINDINGS: Heart is borderline in size. Mild peribronchial thickening  and interstitial prominence. No confluent opacity or effusion. Left pacer is unchanged. IMPRESSION: Borderline heart size. Mild peribronchial thickening and interstitial prominence could reflect bronchitis or early interstitial edema. Electronically Signed   By: Rolm Baptise M.D.   On: 11/02/2016 08:47    EKG: Independently reviewed.     Time spent:60 minutes Code Status:   FULL Family Communication:  Son updated at bedside Disposition Plan: expect 2-3 day hospitalization Consults called: none DVT Prophylaxis: coumadin  Eran Mistry, DO  [facility] Pager [phone]  If 7PM-7AM, please contact night-coverage [email or web address] Password [email or web address] 11/02/2016, 9:54 AM

## 2016-11-02 NOTE — ED Triage Notes (Signed)
Pt reports sharp left sided chest pain that radiates to left arm last night.  Reports pain was relieved with 3 nitro.  Pt woke up this morning and c/o chest tightness, generalized weakness, and shaking

## 2016-11-02 NOTE — ED Notes (Signed)
Date and time results received: 11/02/16 9:04 AM  (use smartphrase ".now" to insert current time)  Test: INR Critical Value: 5.40  Name of Provider Notified: Thurnell Garbe  Orders Received? Or Actions Taken?: Orders Received - See Orders for details

## 2016-11-03 ENCOUNTER — Inpatient Hospital Stay (HOSPITAL_BASED_OUTPATIENT_CLINIC_OR_DEPARTMENT_OTHER): Payer: Medicare Other

## 2016-11-03 ENCOUNTER — Encounter (HOSPITAL_COMMUNITY): Payer: Self-pay

## 2016-11-03 DIAGNOSIS — I2510 Atherosclerotic heart disease of native coronary artery without angina pectoris: Secondary | ICD-10-CM

## 2016-11-03 DIAGNOSIS — R072 Precordial pain: Secondary | ICD-10-CM

## 2016-11-03 DIAGNOSIS — R0789 Other chest pain: Secondary | ICD-10-CM

## 2016-11-03 LAB — BASIC METABOLIC PANEL
ANION GAP: 9 (ref 5–15)
BUN: 35 mg/dL — ABNORMAL HIGH (ref 6–20)
CHLORIDE: 102 mmol/L (ref 101–111)
CO2: 30 mmol/L (ref 22–32)
Calcium: 8.7 mg/dL — ABNORMAL LOW (ref 8.9–10.3)
Creatinine, Ser: 1.49 mg/dL — ABNORMAL HIGH (ref 0.44–1.00)
GFR calc non Af Amer: 30 mL/min — ABNORMAL LOW (ref >60)
GFR, EST AFRICAN AMERICAN: 35 mL/min — AB (ref >60)
Glucose, Bld: 111 mg/dL — ABNORMAL HIGH (ref 65–99)
POTASSIUM: 3.4 mmol/L — AB (ref 3.5–5.1)
Sodium: 141 mmol/L (ref 135–145)

## 2016-11-03 LAB — GLUCOSE, CAPILLARY
GLUCOSE-CAPILLARY: 122 mg/dL — AB (ref 65–99)
GLUCOSE-CAPILLARY: 179 mg/dL — AB (ref 65–99)
Glucose-Capillary: 117 mg/dL — ABNORMAL HIGH (ref 65–99)
Glucose-Capillary: 258 mg/dL — ABNORMAL HIGH (ref 65–99)

## 2016-11-03 LAB — PROTIME-INR
INR: 4.19 — AB
PROTHROMBIN TIME: 41.5 s — AB (ref 11.4–15.2)

## 2016-11-03 LAB — MAGNESIUM: Magnesium: 1.7 mg/dL (ref 1.7–2.4)

## 2016-11-03 LAB — HEMOGLOBIN A1C
Hgb A1c MFr Bld: 8 % — ABNORMAL HIGH (ref 4.8–5.6)
Mean Plasma Glucose: 183 mg/dL

## 2016-11-03 LAB — NM MYOCAR MULTI W/SPECT W/WALL MOTION / EF
CHL CUP NUCLEAR SDS: 4
CSEPPHR: 104 {beats}/min
LV sys vol: 29 mL
LVDIAVOL: 69 mL (ref 46–106)
RATE: 0.45
Rest HR: 85 {beats}/min
SRS: 2
SSS: 6
TID: 1.14

## 2016-11-03 MED ORDER — MAGNESIUM SULFATE 50 % IJ SOLN
Freq: Once | INTRAVENOUS | Status: AC
  Administered 2016-11-03: 17:00:00 via INTRAVENOUS
  Filled 2016-11-03: qty 100

## 2016-11-03 MED ORDER — SODIUM CHLORIDE 0.9% FLUSH
INTRAVENOUS | Status: AC
  Administered 2016-11-03: 10 mL via INTRAVENOUS
  Filled 2016-11-03: qty 10

## 2016-11-03 MED ORDER — ISOSORBIDE MONONITRATE ER 30 MG PO TB24
15.0000 mg | ORAL_TABLET | Freq: Every day | ORAL | Status: DC
  Administered 2016-11-03 – 2016-11-06 (×4): 15 mg via ORAL
  Filled 2016-11-03 (×4): qty 1

## 2016-11-03 MED ORDER — TECHNETIUM TC 99M TETROFOSMIN IV KIT
10.0000 | PACK | Freq: Once | INTRAVENOUS | Status: AC | PRN
  Administered 2016-11-03: 11 via INTRAVENOUS

## 2016-11-03 MED ORDER — MAGNESIUM SULFATE 2 GM/50ML IV SOLN: 2.0000 g | Freq: Once | INTRAVENOUS | Status: DC

## 2016-11-03 MED ORDER — REGADENOSON 0.4 MG/5ML IV SOLN
INTRAVENOUS | Status: AC
  Administered 2016-11-03: 0.4 mg via INTRAVENOUS
  Filled 2016-11-03: qty 5

## 2016-11-03 MED ORDER — POTASSIUM CHLORIDE CRYS ER 20 MEQ PO TBCR
20.0000 meq | EXTENDED_RELEASE_TABLET | Freq: Every day | ORAL | Status: DC
  Administered 2016-11-03 – 2016-11-06 (×4): 20 meq via ORAL
  Filled 2016-11-03 (×4): qty 1

## 2016-11-03 MED ORDER — TECHNETIUM TC 99M TETROFOSMIN IV KIT
30.0000 | PACK | Freq: Once | INTRAVENOUS | Status: AC | PRN
  Administered 2016-11-03: 32 via INTRAVENOUS

## 2016-11-03 NOTE — Progress Notes (Signed)
Progress Note  Patient Name: Summer Wong Date of Encounter: 11/03/2016  Primary Cardiologist: Jacques Earthly MD [facility]  Subjective   No further chest pain, dyspneic at rest. Seen and examined in stress lab prior to [facility] stress test.   Inpatient Medications    Scheduled Meds: . amLODipine  10 mg Oral q morning - 10a  . aspirin EC  81 mg Oral q morning - 10a  . atorvastatin  40 mg Oral QHS  . carvedilol  25 mg Oral BID WC  . cholecalciferol  1,000 Units Oral q morning - 10a  . fenofibrate  160 mg Oral Daily  . ferrous sulfate  325 mg Oral Q breakfast  . furosemide  40 mg Intravenous BID  . insulin aspart  0-15 Units Subcutaneous TID WC  . insulin aspart  0-5 Units Subcutaneous QHS  . insulin glargine  30 Units Subcutaneous QHS  . loratadine  10 mg Oral Daily  . multivitamin-lutein  1 capsule Oral BID  . omega-3 acid ethyl esters  1 g Oral Daily  . oxybutynin  5 mg Oral QHS  . pantoprazole  40 mg Oral Daily  . sodium chloride flush  3 mL Intravenous Q12H   Continuous Infusions: . sodium chloride     PRN Meds: sodium chloride, acetaminophen, albuterol, morphine injection, nitroGLYCERIN, ondansetron (ZOFRAN) IV, sodium chloride flush, technetium tetrofosmin, traMADol   Vital Signs    Vitals:   11/02/16 1030 11/02/16 1116 11/02/16 2133 11/03/16 0656  BP: 131/68 (!) 149/63 133/68 128/66  Pulse: 80 82 84 80  Resp: 15 20 20 18   Temp:  97.7 F (36.5 C) 98.1 F (36.7 C) 98.1 F (36.7 C)  TempSrc:  Oral Oral Oral  SpO2: 95% 93% 94% 95%  Weight:  195 lb 15.8 oz (88.9 kg)  195 lb 13.7 oz (88.8 kg)  Height:  5\' 3"  (1.6 m)      Intake/Output Summary (Last 24 hours) at 11/03/16 0931 Last data filed at 11/03/16 0622  Gross per 24 hour  Intake               50 ml  Output             1600 ml  Net            -1550 ml   Filed Weights   11/02/16 0825 11/02/16 1116 11/03/16 0656  Weight: 194 lb (88 kg) 195 lb 15.8 oz (88.9 kg) 195 lb 13.7 oz (88.8 kg)     Telemetry    Atrial fib with pacing and PVC;s. - Personally Reviewed  Physical Exam   GEN: No acute distress.  Ill appearing  Neck: No JVD Cardiac: IRRR, 1/6 systolic murmur, rubs, or gallops.  Respiratory: Inspiratory rhonchi to the bases,no wheezing.  GI: Soft, nontender, non-distended  MS: No edema; No deformity. Neuro:  Nonfocal  Psych: Normal affect   Labs    Chemistry Recent Labs Lab 11/02/16 0827 11/03/16 0513  NA 141 141  K 3.8 3.4*  CL 102 102  CO2 30 30  GLUCOSE 163* 111*  BUN 36* 35*  CREATININE 1.55* 1.49*  CALCIUM 9.3 8.7*  GFRNONAA 29* 30*  GFRAA 33* 35*  ANIONGAP 9 9     Hematology Recent Labs Lab 11/02/16 0827  WBC 8.6  RBC 4.68  HGB 12.6  HCT 39.1  MCV 83.5  MCH 26.9  MCHC 32.2  RDW 15.2  PLT 227    Cardiac Enzymes Recent Labs Lab 11/02/16 0827 11/02/16 1207  11/02/16 1947  TROPONINI <0.03 <0.03 <0.03   BNP Recent Labs Lab 11/02/16 1207  BNP 239.0*     Radiology    Dg Chest Port 1 View  Result Date: 11/02/2016 CLINICAL DATA:  Chest pain EXAM: PORTABLE CHEST 1 VIEW COMPARISON:  02/27/2016 FINDINGS: Heart is borderline in size. Mild peribronchial thickening and interstitial prominence. No confluent opacity or effusion. Left pacer is unchanged. IMPRESSION: Borderline heart size. Mild peribronchial thickening and interstitial prominence could reflect bronchitis or early interstitial edema. Electronically Signed   By: Rolm Baptise M.D.   On: 11/02/2016 08:47    Cardiac Studies   Echocardiogram 11/02/2016 Study Conclusions  - Left ventricle: The cavity size was normal. Wall thickness was   increased in a pattern of mild LVH. Systolic function was normal.   The estimated ejection fraction was in the range of 60% to 65%.   Wall motion was normal; there were no regional wall motion   abnormalities. The study is not technically sufficient to allow   evaluation of LV diastolic function. - Aortic valve: Mildly to moderately  calcified annulus. Trileaflet;   mildly calcified leaflets. Left coronary cusp mobility was   restricted. Mean gradient (S): 9 mm Hg. Peak gradient (S): 17 mm   Hg. VTI ratio of LVOT to aortic valve: 0.42. - Mitral valve: Calcified annulus. There was mild to moderate   regurgitation directed eccentrically. - Left atrium: The atrium was mildly dilated. - Right ventricle: Pacer wire or catheter noted in right ventricle. - Right atrium: Central venous pressure (est): 3 mm Hg. - Tricuspid valve: There was mild regurgitation. - Pulmonary arteries: PA peak pressure: 35 mm Hg (S). - Pericardium, extracardiac: A prominent pericardial fat pad was   present.  Patient Profile     83 y.o. female with a history of persistent atrial fibrillation on Coumadin, sick sinus syndrome status post pacemaker placement, chronic diastolic heart failure, and previous silent myocardial infarction with negative ischemic workup in 2015 who is being seen today for the evaluation of chest pain.  Assessment & Plan    1. Chest Pain: Resolved this am. Negative troponin. She continues to have some dyspnea at rest. Leane Call completed this am. Results pending.   2. Dyspnea: CXR demonstrated bronchitis, or early interstitial edema. Breathing status has not improved since admission per patient. Some coughing, not productive. Not on O2. Echo demonstrates normal EF, no pulmonary hypertension, AoV and mitral valve calcifications.   3. Chronic Atrial fib: Heart rate is controlled. She is on coumadin at home, but stopped during this admission for supratherapuetic INR of 5.40. Does not report melena or hemoptysis. No evidence of anemia. Continues on coreg.   4. Chronic Diastolic CHF: . No edema, On lasix 40 mg IV BID, with output of 1.550 liters since admission. Slightly hypokalemic. Will replete. Check magnesium as she is on PPI.   5. Hypertension: BP is stable on BB, amlodipine, diuretics.   Signed, Phill Myron. West Pugh, ANP, AACC   11/03/2016, 9:31 AM    Attending note Patient seen and discussed with DNP Purcell Nails, I agree with her documentation. Enzymes negative, EKG nonspecifici ST/T changes. Lexiscan low risk, possible mild peri-infarct ischemia. Echo this admit LVEF 60-65%, no WMAs, cannot eval diastolic function due to afib, mild to mod MR. In this setting given low risk MPI findings, her advanced age, and poor renal function would not pursue cath, manage medically. Current medical therapy with atorva 40, ASA, coreg. Norvasc 10 as additional antianginal. She  has angioedema with ACE-I and poor renal function, no ACE/ARB. We will start imdur 15mg  daily as additional antianginal. If persistent symptoms over time her primary cardiologist can consider cath at that time. Still appears volume overloaded with dyspnea, continue IV diuretics today.    Carlyle Dolly MD

## 2016-11-03 NOTE — Progress Notes (Addendum)
PROGRESS NOTE  Summer Wong GXQ:[id number] DOB: [birth year] DOA: 11/02/2016 PCP: [facility]  Brief History:  83 year old female with a history of diastolic CHF, sick sinus syndrome status post pacemaker, atrial fibrillation, diabetes mellitus, hyperlipidemia, hypertension presents with one-day history of chest discomfort and shortness of breath. Around 6 PM on 11/01/2016, the patient developed chest discomfort down her left arm with associated shortness of breath while watching television. The patient took 3 nitroglycerin after which it improved. On the morning of 11/02/2016, the patient had chest discomfort once again with some shortness of breath. As a result, she presents to emergency department for further evaluation. During this past week, she has not had to use any nitroglycerin.  She states that her weight has been stable with her maximum weight 195.6 pounds this past week. She endorses some dietary indiscretion. She states that she has been compliant with her usual furosemide 40 mg daily. She feels that her lower extremity edema is about the same as usual.  She follows cardiology, Dr. Gennette Pac at [facility]. Her last appointment on 10/15/2016, the patient had a weight of 196 pounds. The patient was admitted to the hospital from 02/27/2016 through 02/29/2016 with acute on chronic diastolic CHF. Her discharge weight was 192 pounds.  Assessment/Plan: Acute on chronic diastolic CHF -daily weights -7/9 CXR--peribronchial thickening suggesting interstitial edema -NEG 1.5 L for the admission -02/29/16 discharge weight 192 -fluid restrict -Echo--EF 60-65%, no WMA, mild TR, mild-mod MR -continue IV Lasix 40 mg BID -Clinically, the patient remains fluid overloaded -remains dyspneic with minimal exertion-->start supplemental oxygen -dietary indiscretion endorsed  Chest pain -Has typical and atypical components -concerned about stable angina -Finish cycling  troponins--neg -EKG unchanged from previous EKGs -Continue aspirin -11/03/16--myoview lexiscan--low risk, possible mild peri-infarct ischemia -appreciate cardiology followup-->manage medically  CKD stage 3 -baseline creatinine 1.3-1.5 -monitor with diuresis  Essential hypertension -Continue amlodipine, carvedilol  Diabetes mellitus type 2 -check hemoglobin A1c--8.0 -Continue reduced dose Lantus -NovoLog sliding scale -CBG well controlled during the hospitalization suggesting dietary indiscretion  Hyperlipidemia -Continue statin, fenofibrate, and fish oil  Atrial fibrillation -Rate controlled -Continue warfarin -Continue carvedilol  Supratherapeutic INR -As patient is hemodynamically stable and her hemoglobin is at baseline without any objective signs of bleeding--> allow the INR to drift downward  SSS -s/p PPM   Disposition Plan:   Home in 1-2 days  Family Communication:   Son at bedside  Consultants:  Cardioogy  Code Status:  FULL / DNR  DVT Prophylaxis:  Coumadin   Procedures: As Listed in Progress Note Above  Antibiotics: None    Subjective: Patient is breathing better but still has some shortness of breath with mild exertion. Denies any fevers, chills, chest pain, nausea, vomiting, diarrhea, abdominal pain, dysuria and hematuria. No rashes.  Objective: Vitals:   11/02/16 1116 11/02/16 2133 11/03/16 0656 11/03/16 1446  BP: (!) 149/63 133/68 128/66 (!) 111/56  Pulse: 82 84 80 90  Resp: 20 20 18 18   Temp: 97.7 F (36.5 C) 98.1 F (36.7 C) 98.1 F (36.7 C) 98.3 F (36.8 C)  TempSrc: Oral Oral Oral   SpO2: 93% 94% 95% 94%  Weight: 88.9 kg (195 lb 15.8 oz)  88.8 kg (195 lb 13.7 oz)   Height: 5\' 3"  (1.6 m)       Intake/Output Summary (Last 24 hours) at 11/03/16 1455 Last data filed at 11/03/16 1403  Gross per 24 hour  Intake  243 ml  Output             1000 ml  Net             -757 ml   Weight change:  Exam:   General:   Pt is alert, follows commands appropriately, not in acute distress  HEENT: No icterus, No thrush, No neck mass, [place]/AT  Cardiovascular: RRR, S1/S2, no rubs, no gallops  Respiratory: Bibasilar crackles. No wheeze.  Abdomen: Soft/+BS, non tender, non distended, no guarding  Extremities: 1 + LE edema, No lymphangitis, No petechiae, No rashes, no synovitis   Data Reviewed: I have personally reviewed following labs and imaging studies Basic Metabolic Panel:  Recent Labs Lab 11/02/16 0827 11/03/16 0513 11/03/16 1026  NA 141 141  --   K 3.8 3.4*  --   CL 102 102  --   CO2 30 30  --   GLUCOSE 163* 111*  --   BUN 36* 35*  --   CREATININE 1.55* 1.49*  --   CALCIUM 9.3 8.7*  --   MG  --   --  1.7   Liver Function Tests: No results for input(s): AST, ALT, ALKPHOS, BILITOT, PROT, ALBUMIN in the last 168 hours. No results for input(s): LIPASE, AMYLASE in the last 168 hours. No results for input(s): AMMONIA in the last 168 hours. Coagulation Profile:  Recent Labs Lab 11/02/16 0827 11/03/16 1026  INR 5.40* 4.19*   CBC:  Recent Labs Lab 11/02/16 0827  WBC 8.6  NEUTROABS 5.2  HGB 12.6  HCT 39.1  MCV 83.5  PLT 227   Cardiac Enzymes:  Recent Labs Lab 11/02/16 0827 11/02/16 1207 11/02/16 1947  TROPONINI <0.03 <0.03 <0.03   BNP: Invalid input(s): POCBNP CBG:  Recent Labs Lab 11/02/16 1134 11/02/16 1653 11/02/16 2133 11/03/16 0802 11/03/16 1140  GLUCAP 139* 129* 121* 117* 122*   HbA1C:  Recent Labs  11/02/16 1207  HGBA1C 8.0*   Urine analysis:    Component Value Date/Time   COLORURINE YELLOW 02/27/2016 1522   APPEARANCEUR CLEAR 02/27/2016 1522   LABSPEC 1.010 02/27/2016 1522   PHURINE 6.0 02/27/2016 1522   GLUCOSEU NEGATIVE 02/27/2016 1522   HGBUR NEGATIVE 02/27/2016 1522   BILIRUBINUR NEGATIVE 02/27/2016 1522   KETONESUR NEGATIVE 02/27/2016 1522   PROTEINUR 100 (A) 02/27/2016 1522   UROBILINOGEN 0.2 08/30/2014 1838   NITRITE NEGATIVE  02/27/2016 1522   LEUKOCYTESUR NEGATIVE 02/27/2016 1522   Sepsis Labs: @LABRCNTIP (procalcitonin:4,lacticidven:4) )No results found for this or any previous visit (from the past 240 hour(s)).   Scheduled Meds: . amLODipine  10 mg Oral q morning - 10a  . aspirin EC  81 mg Oral q morning - 10a  . atorvastatin  40 mg Oral QHS  . carvedilol  25 mg Oral BID WC  . cholecalciferol  1,000 Units Oral q morning - 10a  . fenofibrate  160 mg Oral Daily  . ferrous sulfate  325 mg Oral Q breakfast  . furosemide  40 mg Intravenous BID  . insulin aspart  0-15 Units Subcutaneous TID WC  . insulin aspart  0-5 Units Subcutaneous QHS  . insulin glargine  30 Units Subcutaneous QHS  . isosorbide mononitrate  15 mg Oral Daily  . loratadine  10 mg Oral Daily  . multivitamin-lutein  1 capsule Oral BID  . omega-3 acid ethyl esters  1 g Oral Daily  . oxybutynin  5 mg Oral QHS  . pantoprazole  40 mg Oral Daily  . potassium  chloride  20 mEq Oral Daily  . sodium chloride flush  3 mL Intravenous Q12H   Continuous Infusions: . sodium chloride      Procedures/Studies: Nm Myocar Multi W/spect W/wall Motion / Ef  Result Date: 11/03/2016  There was no ST segment deviation noted during stress. Baseline T-wave inversions at rest and post-injection  Findings consistent with prior mild to moderate inferolateral myocardial infarction with mild peri-infarct ischemia. There is a significant amount of adjacent gut radiotracer uptake that may affect the defect.  The left ventricular ejection fraction is normal (55-65%).  Overall low risk findings, fairly mild amount of myocardium currently at jeopardy.    Dg Chest Port 1 View  Result Date: 11/02/2016 CLINICAL DATA:  Chest pain EXAM: PORTABLE CHEST 1 VIEW COMPARISON:  02/27/2016 FINDINGS: Heart is borderline in size. Mild peribronchial thickening and interstitial prominence. No confluent opacity or effusion. Left pacer is unchanged. IMPRESSION: Borderline heart size.  Mild peribronchial thickening and interstitial prominence could reflect bronchitis or early interstitial edema. Electronically Signed   By: Rolm Baptise M.D.   On: 11/02/2016 08:47    Madalen Gavin, DO  [facility] Pager [phone]  If 7PM-7AM, please contact night-coverage [email or web address] Password [email or web address] 11/03/2016, 2:55 PM   LOS: 1 day

## 2016-11-03 NOTE — Care Management Note (Signed)
Case Management Note  Patient Details  Name: Summer Wong MRN: [id number] Date of Birth: [birth year]  Subjective/Objective:     Pt admitted with CHF exacerbation. She lives alone and is ind with ADL's.She has PCP at the [facility]. She drives herself to appointments. She reports family support. She has used [facility] in the past  but does not feel it is needed this time.                 Action/Plan: Anticipate DC home with self care. CM following.   Expected Discharge Date:  11/04/16               Expected Discharge Plan:     In-House Referral:     Discharge planning Services  CM Consult  Post Acute Care Choice:    Choice offered to:     DME Arranged:    DME Agency:     HH Arranged:    HH Agency:     Status of Service:  In process, will continue to follow  If discussed at Long Length of Stay Meetings, dates discussed:    Additional Comments:  Joedy Eickhoff, Chauncey Reading, RN 11/03/2016, 1:24 PM

## 2016-11-03 NOTE — Progress Notes (Signed)
ANTICOAGULATION CONSULT NOTE - Initial Consult  Pharmacy Consult for Warfarin Indication: atrial fibrillation  Allergies  Allergen Reactions  . Enalapril Maleate Swelling    Throat swelling  . Sulfa Antibiotics Other (See Comments)    Does not remember.   Michail Sermon [Enalaprilat] Swelling    Tongue.     Patient Measurements: Height: 5\' 3"  (160 cm) Weight: 195 lb 13.7 oz (88.8 kg) IBW/kg (Calculated) : 52.4  Vital Signs: Temp: 98.1 F (36.7 C) (07/10 0656) Temp Source: Oral (07/10 0656) BP: 128/66 (07/10 0656) Pulse Rate: 80 (07/10 0656)  Labs:  Recent Labs  11/02/16 0827 11/02/16 1207 11/02/16 1947 11/03/16 0513 11/03/16 1026  HGB 12.6  --   --   --   --   HCT 39.1  --   --   --   --   PLT 227  --   --   --   --   LABPROT 50.9*  --   --   --  41.5*  INR 5.40*  --   --   --  4.19*  CREATININE 1.55*  --   --  1.49*  --   TROPONINI <0.03 <0.03 <0.03  --   --     Estimated Creatinine Clearance: 27.6 mL/min (A) (by C-G formula based on SCr of 1.49 mg/dL (H)).   Medical History: Past Medical History:  Diagnosis Date  . Arthritis   . Asthma   . Atrial fibrillation ([place])    Status post cardioversion in January 2016  . Baker's cyst   . Cancer ([place])    Uterine  . CHF (congestive heart failure) ([place])   . Chronic diastolic heart failure ([place])   . COPD (chronic obstructive pulmonary disease) ([place])   . Essential hypertension   . GERD (gastroesophageal reflux disease)   . Heart murmur   . History of hepatitis A   . Hyperlipemia   . Macular degeneration   . OSA on CPAP   . Pacemaker 2007   [employer] - sick sinus syndrome  . Renal insufficiency   . Sick sinus syndrome ([place]) 2007  . Silent myocardial infarction ([place]) 2006  . Stress incontinence   . TB lung, latent   . Type 2 diabetes mellitus (H[facility])     Medications:  Prescriptions Prior to Admission  Medication Sig Dispense Refill Last Dose  . albuterol (PROVENTIL HFA;VENTOLIN HFA) 108 (90 BASE)  MCG/ACT inhaler Inhale 1-2 puffs into the lungs every 6 (six) hours as needed for wheezing or shortness of breath.   Past Month at Unknown time  . amLODipine (NORVASC) 10 MG tablet Take 10 mg by mouth every morning.    11/02/2016 at Unknown time  . aspirin EC 81 MG tablet Take 81 mg by mouth every morning.   11/02/2016 at Unknown time  . atorvastatin (LIPITOR) 40 MG tablet Take 40 mg by mouth at bedtime.   11/01/2016 at Unknown time  . carvedilol (COREG) 25 MG tablet Take 25 mg by mouth 2 (two) times daily with a meal.   11/02/2016 at 0700  . cetirizine (ZYRTEC) 10 MG tablet Take 10 mg by mouth every morning.    11/02/2016 at Unknown time  . cholecalciferol (VITAMIN D) 1000 UNITS tablet Take 1,000 Units by mouth every morning.    11/02/2016 at Unknown time  . fenofibrate (TRICOR) 145 MG tablet Take 145 mg by mouth every morning.   11/02/2016 at Unknown time  . ferrous sulfate 325 (65 FE) MG tablet Take 325  mg by mouth daily with breakfast.   11/02/2016 at Unknown time  . furosemide (LASIX) 20 MG tablet Take 1 tablet (20 mg total) by mouth daily. (Patient taking differently: Take 40-80 mg by mouth See admin instructions. Take 2 tab a day, and if patient gains weight, takes 1 more tab) 30 tablet 2 11/02/2016 at Unknown time  . insulin glargine (LANTUS) 100 UNIT/ML injection Inject 43 Units into the skin at bedtime.   11/01/2016 at 1900  . Multiple Vitamins-Minerals (PRESERVISION AREDS) CAPS Take 1 capsule by mouth 2 (two) times daily.   Past Week at Unknown time  . nitroGLYCERIN (NITROSTAT) 0.4 MG SL tablet Place 0.4 mg under the tongue every 5 (five) minutes as needed for chest pain.   11/01/2016 at 1800  . Omega-3 Fatty Acids (FISH OIL) 1000 MG CAPS Take 1,000 mg by mouth daily.    11/02/2016 at Unknown time  . omeprazole (PRILOSEC) 20 MG capsule Take 20 mg by mouth every morning.    11/02/2016 at Unknown time  . oxybutynin (DITROPAN-XL) 5 MG 24 hr tablet Take 5 mg by mouth at bedtime.   11/01/2016 at Unknown time  . traMADol  (ULTRAM) 50 MG tablet Take 1 tablet (50 mg total) by mouth every 6 (six) hours as needed for moderate pain. 30 tablet 0 Past Month at Unknown time  . warfarin (COUMADIN) 4 MG tablet Take 2-4 mg by mouth See admin instructions. Takes 1 tab everyday except Sundays; takes 2 mg (1/2 tab) on Sundays   11/01/2016 at 1900  . acetaminophen (TYLENOL) 500 MG tablet Take 1,000 mg by mouth every 6 (six) hours as needed for moderate pain or headache.   10/31/2016   Assessment: Okay for Protocol,  INR above goal on admission.  Remains above goal today.  Goal of Therapy:  INR 2-3   Plan:  No warfarin today. Daily PT / INR.  Monitor for signs and symptoms of bleeding.    Pricilla Larsson 11/03/2016,12:02 PM

## 2016-11-03 NOTE — Progress Notes (Signed)
CRITICAL VALUE ALERT  Critical Value:  INR 4.19  Date & Time Notied:  11/03/16 1148  Provider Notified: Text-paged Dr. Carles Collet to notify of critical result.   Orders Received/Actions taken: No orders at this time.

## 2016-11-04 ENCOUNTER — Inpatient Hospital Stay (HOSPITAL_COMMUNITY): Payer: Medicare Other

## 2016-11-04 DIAGNOSIS — I5033 Acute on chronic diastolic (congestive) heart failure: Secondary | ICD-10-CM

## 2016-11-04 DIAGNOSIS — R791 Abnormal coagulation profile: Secondary | ICD-10-CM

## 2016-11-04 DIAGNOSIS — R0600 Dyspnea, unspecified: Secondary | ICD-10-CM

## 2016-11-04 LAB — BASIC METABOLIC PANEL
Anion gap: 10 (ref 5–15)
BUN: 37 mg/dL — AB (ref 6–20)
CHLORIDE: 98 mmol/L — AB (ref 101–111)
CO2: 30 mmol/L (ref 22–32)
Calcium: 8.7 mg/dL — ABNORMAL LOW (ref 8.9–10.3)
Creatinine, Ser: 1.61 mg/dL — ABNORMAL HIGH (ref 0.44–1.00)
GFR calc non Af Amer: 27 mL/min — ABNORMAL LOW (ref >60)
GFR, EST AFRICAN AMERICAN: 32 mL/min — AB (ref >60)
Glucose, Bld: 161 mg/dL — ABNORMAL HIGH (ref 65–99)
POTASSIUM: 3.7 mmol/L (ref 3.5–5.1)
SODIUM: 138 mmol/L (ref 135–145)

## 2016-11-04 LAB — GLUCOSE, CAPILLARY
GLUCOSE-CAPILLARY: 172 mg/dL — AB (ref 65–99)
GLUCOSE-CAPILLARY: 161 mg/dL — AB (ref 65–99)
GLUCOSE-CAPILLARY: 134 mg/dL — AB (ref 65–99)
GLUCOSE-CAPILLARY: 162 mg/dL — AB (ref 65–99)

## 2016-11-04 LAB — CBC
HCT: 31.8 % — ABNORMAL LOW (ref 36.0–46.0)
Hemoglobin: 10.4 g/dL — ABNORMAL LOW (ref 12.0–15.0)
MCH: 27.4 pg (ref 26.0–34.0)
MCHC: 32.7 g/dL (ref 30.0–36.0)
MCV: 83.9 fL (ref 78.0–100.0)
Platelets: 194 10*3/uL (ref 150–400)
RBC: 3.79 MIL/uL — ABNORMAL LOW (ref 3.87–5.11)
RDW: 15.2 % (ref 11.5–15.5)
WBC: 9.4 10*3/uL (ref 4.0–10.5)

## 2016-11-04 LAB — PROTIME-INR
INR: 3.59
PROTHROMBIN TIME: 36.7 s — AB (ref 11.4–15.2)

## 2016-11-04 LAB — LIPASE, BLOOD: LIPASE: 32 U/L (ref 11–51)

## 2016-11-04 LAB — MAGNESIUM: Magnesium: 2.1 mg/dL (ref 1.7–2.4)

## 2016-11-04 MED ORDER — POLYETHYLENE GLYCOL 3350 17 G PO PACK
17.0000 g | PACK | Freq: Two times a day (BID) | ORAL | Status: DC
  Administered 2016-11-04 – 2016-11-05 (×4): 17 g via ORAL
  Filled 2016-11-04 (×5): qty 1

## 2016-11-04 MED ORDER — FUROSEMIDE 10 MG/ML IJ SOLN
40.0000 mg | Freq: Three times a day (TID) | INTRAMUSCULAR | Status: DC
  Administered 2016-11-04 – 2016-11-05 (×4): 40 mg via INTRAVENOUS
  Filled 2016-11-04 (×4): qty 4

## 2016-11-04 NOTE — Progress Notes (Signed)
Progress Note  Patient Name: Courtnay Petrilla Date of Encounter: 11/04/2016   Subjective   Some improvement in SOB  Inpatient Medications    Scheduled Meds: . amLODipine  10 mg Oral q morning - 10a  . aspirin EC  81 mg Oral q morning - 10a  . atorvastatin  40 mg Oral QHS  . carvedilol  25 mg Oral BID WC  . cholecalciferol  1,000 Units Oral q morning - 10a  . fenofibrate  160 mg Oral Daily  . ferrous sulfate  325 mg Oral Q breakfast  . furosemide  40 mg Intravenous Q8H  . insulin aspart  0-15 Units Subcutaneous TID WC  . insulin aspart  0-5 Units Subcutaneous QHS  . insulin glargine  30 Units Subcutaneous QHS  . isosorbide mononitrate  15 mg Oral Daily  . loratadine  10 mg Oral Daily  . multivitamin-lutein  1 capsule Oral BID  . omega-3 acid ethyl esters  1 g Oral Daily  . oxybutynin  5 mg Oral QHS  . pantoprazole  40 mg Oral Daily  . potassium chloride  20 mEq Oral Daily  . sodium chloride flush  3 mL Intravenous Q12H   Continuous Infusions: . sodium chloride     PRN Meds: sodium chloride, acetaminophen, albuterol, morphine injection, nitroGLYCERIN, ondansetron (ZOFRAN) IV, sodium chloride flush, traMADol   Vital Signs    Vitals:   11/03/16 1611 11/03/16 2119 11/03/16 2128 11/04/16 0635  BP:   (!) 121/59 120/63  Pulse:  77 82 80  Resp:  16 16 16   Temp:   98.3 F (36.8 C) 98.2 F (36.8 C)  TempSrc:   Axillary Axillary  SpO2: 94% 98% 98% 98%  Weight:    195 lb 12.7 oz (88.8 kg)  Height:        Intake/Output Summary (Last 24 hours) at 11/04/16 0859 Last data filed at 11/04/16 0248  Gross per 24 hour  Intake           512.17 ml  Output              950 ml  Net          -437.83 ml   Filed Weights   11/02/16 1116 11/03/16 0656 11/04/16 0635  Weight: 195 lb 15.8 oz (88.9 kg) 195 lb 13.7 oz (88.8 kg) 195 lb 12.7 oz (88.8 kg)    Telemetry    N/A  ECG      Physical Exam   GEN: No acute distress.   Neck: elevated JVD, +hepatojugular reflux Cardiac:  irreg, 2/6 systolic murmur at apex, no rubs, or gallops.  Respiratory: Clear to auscultation bilaterally. GI: Soft, nontender, non-distended  MS: No edema; No deformity. Neuro:  Nonfocal  Psych: Normal affect   Labs    Chemistry Recent Labs Lab 11/02/16 0827 11/03/16 0513 11/04/16 0527  NA 141 141 138  K 3.8 3.4* 3.7  CL 102 102 98*  CO2 30 30 30   GLUCOSE 163* 111* 161*  BUN 36* 35* 37*  CREATININE 1.55* 1.49* 1.61*  CALCIUM 9.3 8.7* 8.7*  GFRNONAA 29* 30* 27*  GFRAA 33* 35* 32*  ANIONGAP 9 9 10      Hematology Recent Labs Lab 11/02/16 0827  WBC 8.6  RBC 4.68  HGB 12.6  HCT 39.1  MCV 83.5  MCH 26.9  MCHC 32.2  RDW 15.2  PLT 227    Cardiac Enzymes Recent Labs Lab 11/02/16 0827 11/02/16 1207 11/02/16 1947  TROPONINI <0.03 <0.03 <0.03  No results for input(s): TROPIPOC in the last 168 hours.   BNP Recent Labs Lab 11/02/16 1207  BNP 239.0*     DDimer No results for input(s): DDIMER in the last 168 hours.   Radiology    Nm Myocar Multi W/spect W/wall Motion / Ef  Result Date: 11/03/2016  There was no ST segment deviation noted during stress. Baseline T-wave inversions at rest and post-injection  Findings consistent with prior mild to moderate inferolateral myocardial infarction with mild peri-infarct ischemia. There is a significant amount of adjacent gut radiotracer uptake that may affect the defect.  The left ventricular ejection fraction is normal (55-65%).  Overall low risk findings, fairly mild amount of myocardium currently at jeopardy.     Cardiac Studies    Patient Profile     83 y.o. female with a historyof persistent atrial fibrillation on Coumadin, sick sinus syndrome status post pacemaker placement, chronic diastolic heart failure, and previous silent myocardial infarction with negative ischemic workup in 2015who is being seen today for the evaluation of chest pain.  Assessment & Plan    1. Chest pain - negative workup for  ACS - Echo this admit LVEF 60-65%, no WMAs, cannot eval diastolic function due to afib, mild to mod MR. - Lexiscan low risk, possible mild peri-infarct ischemia - In this setting given low risk MPI findings, her advanced age, and poor renal function would not pursue cath, manage medically - yesterday started imdur as additional antianginal. Continued ASA, atorva, coreg. No ACE-I/ARB due to renal function If persistent symptoms over time her primary cardiologist can consider cath at that time  2. Acute on chronic diastolic HF - Echo this admit LVEF 60-65%, no WMAs, cannot eval diastolic function due to afib, mild to mod MR. - negative 2 liters this admission. SHe is on lasix 40mg  IV tid increased this AM. Mild variations in renal function, overall stable. Reported weights unchanged at 195 lbs.  - repeat CXR today. Remains fluid overloaded by exam with elevated JVD and +hepatojuglualr reflux, agree with increaed lasix today.      Merrily Pew, MD  11/04/2016, 8:59 AM

## 2016-11-04 NOTE — Progress Notes (Signed)
[facility] for Warfarin Indication: atrial fibrillation  Allergies  Allergen Reactions  . Enalapril Maleate Swelling    Throat swelling  . Sulfa Antibiotics Other (See Comments)    Does not remember.   Michail Sermon [Enalaprilat] Swelling    Tongue.    Patient Measurements: Height: 5\' 3"  (160 cm) Weight: 195 lb 12.7 oz (88.8 kg) IBW/kg (Calculated) : 52.4  Vital Signs: Temp: 98.2 F (36.8 C) (07/11 0635) Temp Source: Axillary (07/11 0635) BP: 120/63 (07/11 0635) Pulse Rate: 80 (07/11 0635)  Labs:  Recent Labs  11/02/16 0827 11/02/16 1207 11/02/16 1947 11/03/16 0513 11/03/16 1026 11/04/16 0527  HGB 12.6  --   --   --   --   --   HCT 39.1  --   --   --   --   --   PLT 227  --   --   --   --   --   LABPROT 50.9*  --   --   --  41.5* 36.7*  INR 5.40*  --   --   --  4.19* 3.59  CREATININE 1.55*  --   --  1.49*  --  1.61*  TROPONINI <0.03 <0.03 <0.03  --   --   --    Estimated Creatinine Clearance: 25.5 mL/min (A) (by C-G formula based on SCr of 1.61 mg/dL (H)).  Medical History: Past Medical History:  Diagnosis Date  . Arthritis   . Asthma   . Atrial fibrillation ([place])    Status post cardioversion in January 2016  . Baker's cyst   . Cancer ([place])    Uterine  . CHF (congestive heart failure) ([place])   . Chronic diastolic heart failure ([place])   . COPD (chronic obstructive pulmonary disease) ([place])   . Essential hypertension   . GERD (gastroesophageal reflux disease)   . Heart murmur   . History of hepatitis A   . Hyperlipemia   . Macular degeneration   . OSA on CPAP   . Pacemaker 2007   [employer] - sick sinus syndrome  . Renal insufficiency   . Sick sinus syndrome ([place]) 2007  . Silent myocardial infarction ([place]) 2006  . Stress incontinence   . TB lung, latent   . Type 2 diabetes mellitus ([facility])    Medications:  Prescriptions Prior to Admission  Medication Sig Dispense Refill Last Dose  . albuterol (PROVENTIL  HFA;VENTOLIN HFA) 108 (90 BASE) MCG/ACT inhaler Inhale 1-2 puffs into the lungs every 6 (six) hours as needed for wheezing or shortness of breath.   Past Month at Unknown time  . amLODipine (NORVASC) 10 MG tablet Take 10 mg by mouth every morning.    11/02/2016 at Unknown time  . aspirin EC 81 MG tablet Take 81 mg by mouth every morning.   11/02/2016 at Unknown time  . atorvastatin (LIPITOR) 40 MG tablet Take 40 mg by mouth at bedtime.   11/01/2016 at Unknown time  . carvedilol (COREG) 25 MG tablet Take 25 mg by mouth 2 (two) times daily with a meal.   11/02/2016 at 0700  . cetirizine (ZYRTEC) 10 MG tablet Take 10 mg by mouth every morning.    11/02/2016 at Unknown time  . cholecalciferol (VITAMIN D) 1000 UNITS tablet Take 1,000 Units by mouth every morning.    11/02/2016 at Unknown time  . fenofibrate (TRICOR) 145 MG tablet Take 145 mg by mouth every morning.   11/02/2016 at Unknown time  .  ferrous sulfate 325 (65 FE) MG tablet Take 325 mg by mouth daily with breakfast.   11/02/2016 at Unknown time  . furosemide (LASIX) 20 MG tablet Take 1 tablet (20 mg total) by mouth daily. (Patient taking differently: Take 40-60 mg by mouth See admin instructions. Take 2 tab a day, and if patient gains weight, takes 1 more tab) 30 tablet 2 11/02/2016 at Unknown time  . insulin glargine (LANTUS) 100 UNIT/ML injection Inject 43 Units into the skin at bedtime.   11/01/2016 at 1900  . Multiple Vitamins-Minerals (PRESERVISION AREDS) CAPS Take 1 capsule by mouth 2 (two) times daily.   Past Week at Unknown time  . nitroGLYCERIN (NITROSTAT) 0.4 MG SL tablet Place 0.4 mg under the tongue every 5 (five) minutes as needed for chest pain.   11/01/2016 at 1800  . Omega-3 Fatty Acids (FISH OIL) 1000 MG CAPS Take 1,000 mg by mouth daily.    11/02/2016 at Unknown time  . omeprazole (PRILOSEC) 20 MG capsule Take 20 mg by mouth every morning.    11/02/2016 at Unknown time  . oxybutynin (DITROPAN-XL) 5 MG 24 hr tablet Take 5 mg by mouth at bedtime.    11/01/2016 at Unknown time  . traMADol (ULTRAM) 50 MG tablet Take 1 tablet (50 mg total) by mouth every 6 (six) hours as needed for moderate pain. 30 tablet 0 Past Month at Unknown time  . warfarin (COUMADIN) 4 MG tablet Take 2-4 mg by mouth See admin instructions. Takes 1 tab everyday except Sundays; takes 2 mg (1/2 tab) on Sundays   11/01/2016 at 1900  . acetaminophen (TYLENOL) 500 MG tablet Take 1,000 mg by mouth every 6 (six) hours as needed for moderate pain or headache.   10/31/2016   Assessment: Okay for Protocol,  INR remains above goal today.  Goal of Therapy:  INR 2-3   Plan:  No warfarin today. Daily PT / INR.  Monitor for signs and symptoms of bleeding.    Pricilla Larsson 11/04/2016,10:04 AM

## 2016-11-04 NOTE — Progress Notes (Signed)
PROGRESS NOTE  Summer Wong GGY:[id number] DOB: Sep 18, 1925 DOA: 11/02/2016 PCP: [facility]  Brief History:  83 year old female with a history of diastolic CHF, sick sinus syndrome status post pacemaker, atrial fibrillation, diabetes mellitus, hyperlipidemia, hypertension presents with one-day history of chest discomfort and shortness of breath. Around 6 PM on 11/01/2016, the patient developed chest discomfort down her left arm with associated shortness of breath while watching television. The patient took 3 nitroglycerin after which it improved. On the morning of 11/02/2016, the patient had chest discomfort once again with some shortness of breath. As a result, she presents to emergency department for further evaluation. During this past week, she has not had to use any nitroglycerin.  She states that her weight has been stable with her maximum weight 195.6 pounds this past week. She endorses some dietary indiscretion. She states that she has been compliant with her usual furosemide 40 mg daily. She feels that her lower extremity edema is about the same as usual.  She follows cardiology, Dr. Gennette Pac at [facility]. Her last appointment on 10/15/2016, the patient had a weight of 196 pounds. The patient was admitted to the hospital from 02/27/2016 through 02/29/2016 with acute on chronic diastolic CHF. Her discharge weight was 192 pounds.  Assessment/Plan: Acute on chronic diastolic CHF- still volume overloaded but patient's reports shortness of breath has improved, orthopnea appears to have improved -daily weights- still unchanged 195 -7/9 CXR--peribronchial thickening suggesting interstitial edema -NEG 2L since admission -02/29/16 discharge weight 192 -fluid restrict -Echo--7/9 EF 60-65%, no WMA, mild TR, mild-mod MR - Increase IV Lasix to 40 TID -Creatinine 1.6, with stable bicarbonate 30. - Doubt thromboembolic disease as patient has been supratherapeutic  on warfarin - Check O2 requirements tomorrow  Chest pain- troponins and EKG unremarkable  -11/03/16--myoview lexiscan--low risk, possible mild peri-infarct ischemia -appreciate cardiology followup-->manage medically  Abdominal pain, nausea- likely from constipation. Abdominal pain- . Peri-Umbilical and suprapubic, started early this a.m.. Minimally tender. No vomiting. No abdominal distention. Reports to normal consistency bowel movements yesterday - CBC shows stable white blood count- 9.4 - Lipase normal at 32 - Abdominal x-ray shows large stool burden - UA- pending - Start MiraLAX twice a day - No indication for further imaging at this time with no fevers, negative leukocytosis, no diarrhea or vomiting. - Repeat chest x-ray 7/11- mild interstitial densities bilaterally, chronic scarring/lung disease possible superimposed acute edema.  CKD stage 3- 1.6. baseline creatinine ~1.3-1.5 -monitor with diuresis  Essential hypertension -Continue amlodipine, carvedilol  Diabetes mellitus type 2 hemoglobin A1c--8.0- 7/9. Stable CBGs -Continue reduced dose Lantus -NovoLog sliding scale -CBG well controlled during the hospitalization suggesting dietary indiscretion  Hyperlipidemia -Continue statin, fenofibrate, and fish oil  Atrial fibrillation- Rate controlled -Continue warfarin -Continue carvedilol  Supratherapeutic INR- stable Hgb, no signs of bleeding. INR 3.5- 7/11. -Pharmacy consult  SSS -s/p PPM  Disposition Plan:   Pending improvement in respiratory stasis and diuresis.   Family Communication:   No family at bedside  Consultants:  Cardiology  Code Status:  FULL / DNR  DVT Prophylaxis:  Coumadin  Procedures: As Listed in Progress Note Above  Antibiotics: None  Subjective: Patient reports her breathing is better. She is lying in bed, almost flat. Reports abdominal pain this morning, periumbilical and suprapubic. Endorses nausea but no vomiting,. Had 2 normal  consistency bowel movements yesterday. Denies abdominal distention abdomen appears full/ bloated  Objective: Vitals:   11/03/16 1611  11/03/16 2119 11/03/16 2128 11/04/16 0635  BP:   (!) 121/59 120/63  Pulse:  77 82 80  Resp:  16 16 16   Temp:   98.3 F (36.8 C) 98.2 F (36.8 C)  TempSrc:   Axillary Axillary  SpO2: 94% 98% 98% 98%  Weight:    88.8 kg (195 lb 12.7 oz)  Height:        Intake/Output Summary (Last 24 hours) at 11/04/16 1445 Last data filed at 11/04/16 1442  Gross per 24 hour  Intake           509.17 ml  Output             1350 ml  Net          -840.83 ml   Weight change: 0.812 kg (1 lb 12.7 oz) Exam:   General:  Pt is alert, follows commands appropriately, not in acute distress  HEENT: No icterus, No thrush, No neck mass, [place]/AT  Cardiovascular: RRR, S1/S2, no rubs, no gallops  Respiratory: Minimal basilar crackles. No wheeze. Normal work of breathing  Abdomen: Soft/+BS, minimal tenderness periumbilical and suprapubic tender, abdomen appears full, no guarding  Extremities: 1 + LE edema, No lymphangitis, No petechiae, No rashes, no synovitis  Data Reviewed: I have personally reviewed following labs and imaging studies Basic Metabolic Panel:  Recent Labs Lab 11/02/16 0827 11/03/16 0513 11/03/16 1026 11/04/16 0527  NA 141 141  --  138  K 3.8 3.4*  --  3.7  CL 102 102  --  98*  CO2 30 30  --  30  GLUCOSE 163* 111*  --  161*  BUN 36* 35*  --  37*  CREATININE 1.55* 1.49*  --  1.61*  CALCIUM 9.3 8.7*  --  8.7*  MG  --   --  1.7 2.1    Recent Labs Lab 11/04/16 0527  LIPASE 32   Coagulation Profile:  Recent Labs Lab 11/02/16 0827 11/03/16 1026 11/04/16 0527  INR 5.40* 4.19* 3.59   CBC:  Recent Labs Lab 11/02/16 0827 11/04/16 0527  WBC 8.6 9.4  NEUTROABS 5.2  --   HGB 12.6 10.4*  HCT 39.1 31.8*  MCV 83.5 83.9  PLT 227 194   Cardiac Enzymes:  Recent Labs Lab 11/02/16 0827 11/02/16 1207 11/02/16 1947  TROPONINI <0.03 <0.03  <0.03   CBG:  Recent Labs Lab 11/03/16 1140 11/03/16 1640 11/03/16 2126 11/04/16 0741 11/04/16 1133  GLUCAP 122* 179* 258* 172* 161*   HbA1C:  Recent Labs  11/02/16 1207  HGBA1C 8.0*   Urine analysis:    Component Value Date/Time   COLORURINE YELLOW 02/27/2016 [place] 02/27/2016 1522   LABSPEC 1.010 02/27/2016 1522   PHURINE 6.0 02/27/2016 1522   GLUCOSEU NEGATIVE 02/27/2016 1522   HGBUR NEGATIVE 02/27/2016 1522   BILIRUBINUR NEGATIVE 02/27/2016 1522   KETONESUR NEGATIVE 02/27/2016 1522   PROTEINUR 100 (A) 02/27/2016 1522   UROBILINOGEN 0.2 08/30/2014 1838   NITRITE NEGATIVE 02/27/2016 1522   LEUKOCYTESUR NEGATIVE 02/27/2016 1522   Scheduled Meds: . amLODipine  10 mg Oral q morning - 10a  . aspirin EC  81 mg Oral q morning - 10a  . atorvastatin  40 mg Oral QHS  . carvedilol  25 mg Oral BID WC  . cholecalciferol  1,000 Units Oral q morning - 10a  . fenofibrate  160 mg Oral Daily  . ferrous sulfate  325 mg Oral Q breakfast  . furosemide  40 mg Intravenous Q8H  .  insulin aspart  0-15 Units Subcutaneous TID WC  . insulin aspart  0-5 Units Subcutaneous QHS  . insulin glargine  30 Units Subcutaneous QHS  . isosorbide mononitrate  15 mg Oral Daily  . loratadine  10 mg Oral Daily  . multivitamin-lutein  1 capsule Oral BID  . omega-3 acid ethyl esters  1 g Oral Daily  . oxybutynin  5 mg Oral QHS  . pantoprazole  40 mg Oral Daily  . potassium chloride  20 mEq Oral Daily  . sodium chloride flush  3 mL Intravenous Q12H   Continuous Infusions: . sodium chloride      Procedures/Studies: Dg Chest 2 View  Result Date: 11/04/2016 CLINICAL DATA:  Dyspnea. EXAM: CHEST  2 VIEW COMPARISON:  Radiograph of November 02, 2016. FINDINGS: The heart size and mediastinal contours are within normal limits. Atherosclerosis of thoracic aorta is noted. Left-sided pacemaker is unchanged in position. No pneumothorax or pleural effusion is noted. Mild interstitial prominence  is noted which most likely represents chronic scarring or chronic lung disease, but superimposed edema or inflammation cannot be excluded. The visualized skeletal structures are unremarkable. IMPRESSION: Aortic atherosclerosis. Stable mild interstitial densities bilaterally most consistent with chronic scarring or lung disease, but superimposed acute edema or inflammation cannot be excluded. Electronically Signed   By: Marijo Conception, M.D.   On: 11/04/2016 10:50   Dg Abd 1 View  Result Date: 11/04/2016 CLINICAL DATA:  Abdominal pain this morning. EXAM: ABDOMEN - 1 VIEW COMPARISON:  CT abdomen and pelvis 12/14/2014. FINDINGS: The bowel gas pattern is nonobstructive. Prominent stool burden in the descending and rectosigmoid colon is noted. Convex left scoliosis is seen. The patient is status post cholecystectomy. IMPRESSION: No acute abnormality. Prominent stool burden descending and rectosigmoid colon. Scoliosis. Electronically Signed   By: Inge Rise M.D.   On: 11/04/2016 10:50   Nm Myocar Multi W/spect W/wall Motion / Ef  Result Date: 11/03/2016  There was no ST segment deviation noted during stress. Baseline T-wave inversions at rest and post-injection  Findings consistent with prior mild to moderate inferolateral myocardial infarction with mild peri-infarct ischemia. There is a significant amount of adjacent gut radiotracer uptake that may affect the defect.  The left ventricular ejection fraction is normal (55-65%).  Overall low risk findings, fairly mild amount of myocardium currently at jeopardy.    Dg Chest Port 1 View  Result Date: 11/02/2016 CLINICAL DATA:  Chest pain EXAM: PORTABLE CHEST 1 VIEW COMPARISON:  02/27/2016 FINDINGS: Heart is borderline in size. Mild peribronchial thickening and interstitial prominence. No confluent opacity or effusion. Left pacer is unchanged. IMPRESSION: Borderline heart size. Mild peribronchial thickening and interstitial prominence could reflect  bronchitis or early interstitial edema. Electronically Signed   By: Rolm Baptise M.D.   On: 11/02/2016 08:47    Cataldo Cosgriff Arlyce Dice, MD  [facility] Pager [phone]  If 7PM-7AM, please contact night-coverage [email or web address] Password [email or web address] 11/04/2016, 2:45 PM   LOS: 2 days

## 2016-11-05 LAB — URINALYSIS, ROUTINE W REFLEX MICROSCOPIC
Bilirubin Urine: NEGATIVE
Glucose, UA: NEGATIVE mg/dL
KETONES UR: NEGATIVE mg/dL
Leukocytes, UA: NEGATIVE
Nitrite: POSITIVE — AB
PROTEIN: NEGATIVE mg/dL
Specific Gravity, Urine: 1.01 (ref 1.005–1.030)
pH: 5 (ref 5.0–8.0)

## 2016-11-05 LAB — BASIC METABOLIC PANEL
Anion gap: 9 (ref 5–15)
BUN: 38 mg/dL — AB (ref 6–20)
CALCIUM: 8.6 mg/dL — AB (ref 8.9–10.3)
CO2: 31 mmol/L (ref 22–32)
CREATININE: 1.77 mg/dL — AB (ref 0.44–1.00)
Chloride: 97 mmol/L — ABNORMAL LOW (ref 101–111)
GFR calc Af Amer: 28 mL/min — ABNORMAL LOW (ref >60)
GFR, EST NON AFRICAN AMERICAN: 24 mL/min — AB (ref >60)
Glucose, Bld: 139 mg/dL — ABNORMAL HIGH (ref 65–99)
POTASSIUM: 3.9 mmol/L (ref 3.5–5.1)
SODIUM: 137 mmol/L (ref 135–145)

## 2016-11-05 LAB — GLUCOSE, CAPILLARY
GLUCOSE-CAPILLARY: 147 mg/dL — AB (ref 65–99)
Glucose-Capillary: 140 mg/dL — ABNORMAL HIGH (ref 65–99)
Glucose-Capillary: 134 mg/dL — ABNORMAL HIGH (ref 65–99)
Glucose-Capillary: 144 mg/dL — ABNORMAL HIGH (ref 65–99)

## 2016-11-05 LAB — PROTIME-INR
INR: 2.49
PROTHROMBIN TIME: 27.4 s — AB (ref 11.4–15.2)

## 2016-11-05 MED ORDER — BISACODYL 10 MG RE SUPP
10.0000 mg | Freq: Once | RECTAL | Status: AC
  Administered 2016-11-05: 10 mg via RECTAL
  Filled 2016-11-05: qty 1

## 2016-11-05 MED ORDER — CEPHALEXIN 500 MG PO CAPS
500.0000 mg | ORAL_CAPSULE | Freq: Two times a day (BID) | ORAL | Status: DC
  Administered 2016-11-05 – 2016-11-06 (×3): 500 mg via ORAL
  Filled 2016-11-05 (×3): qty 1

## 2016-11-05 MED ORDER — WARFARIN - PHARMACIST DOSING INPATIENT
Status: DC
  Administered 2016-11-05: 16:00:00

## 2016-11-05 MED ORDER — WARFARIN SODIUM 2 MG PO TABS
4.0000 mg | ORAL_TABLET | Freq: Once | ORAL | Status: AC
  Administered 2016-11-05: 4 mg via ORAL
  Filled 2016-11-05: qty 2

## 2016-11-05 MED ORDER — SENNOSIDES-DOCUSATE SODIUM 8.6-50 MG PO TABS
2.0000 | ORAL_TABLET | Freq: Every day | ORAL | Status: DC
  Administered 2016-11-05: 2 via ORAL
  Filled 2016-11-05 (×2): qty 2

## 2016-11-05 NOTE — Progress Notes (Signed)
[facility] for Warfarin Indication: atrial fibrillation  Allergies  Allergen Reactions  . Enalapril Maleate Swelling    Throat swelling  . Sulfa Antibiotics Other (See Comments)    Does not remember.   Michail Sermon [Enalaprilat] Swelling    Tongue.    Patient Measurements: Height: 5\' 3"  (160 cm) Weight: 194 lb 12.8 oz (88.4 kg) IBW/kg (Calculated) : 52.4  Vital Signs: Temp: 98.9 F (37.2 C) (07/12 0602) Temp Source: Oral (07/12 0602) BP: 124/58 (07/12 0602) Pulse Rate: 84 (07/12 0602)  Labs:  Recent Labs  11/02/16 1207 11/02/16 1947 11/03/16 0513 11/03/16 1026 11/04/16 0527 11/05/16 0559  HGB  --   --   --   --  10.4*  --   HCT  --   --   --   --  31.8*  --   PLT  --   --   --   --  194  --   LABPROT  --   --   --  41.5* 36.7* 27.4*  INR  --   --   --  4.19* 3.59 2.49  CREATININE  --   --  1.49*  --  1.61* 1.77*  TROPONINI <0.03 <0.03  --   --   --   --    Estimated Creatinine Clearance: 23.2 mL/min (A) (by C-G formula based on SCr of 1.77 mg/dL (H)).  Medical History: Past Medical History:  Diagnosis Date  . Arthritis   . Asthma   . Atrial fibrillation ([place])    Status post cardioversion in January 2016  . Baker's cyst   . Cancer ([place])    Uterine  . CHF (congestive heart failure) ([place])   . Chronic diastolic heart failure ([place])   . COPD (chronic obstructive pulmonary disease) ([place])   . Essential hypertension   . GERD (gastroesophageal reflux disease)   . Heart murmur   . History of hepatitis A   . Hyperlipemia   . Macular degeneration   . OSA on CPAP   . Pacemaker 2007   [employer] - sick sinus syndrome  . Renal insufficiency   . Sick sinus syndrome ([place]) 2007  . Silent myocardial infarction ([place]) 2006  . Stress incontinence   . TB lung, latent   . Type 2 diabetes mellitus ([facility])    Medications:  Prescriptions Prior to Admission  Medication Sig Dispense Refill Last Dose  . albuterol (PROVENTIL  HFA;VENTOLIN HFA) 108 (90 BASE) MCG/ACT inhaler Inhale 1-2 puffs into the lungs every 6 (six) hours as needed for wheezing or shortness of breath.   Past Month at Unknown time  . amLODipine (NORVASC) 10 MG tablet Take 10 mg by mouth every morning.    11/02/2016 at Unknown time  . aspirin EC 81 MG tablet Take 81 mg by mouth every morning.   11/02/2016 at Unknown time  . atorvastatin (LIPITOR) 40 MG tablet Take 40 mg by mouth at bedtime.   11/01/2016 at Unknown time  . carvedilol (COREG) 25 MG tablet Take 25 mg by mouth 2 (two) times daily with a meal.   11/02/2016 at 0700  . cetirizine (ZYRTEC) 10 MG tablet Take 10 mg by mouth every morning.    11/02/2016 at Unknown time  . cholecalciferol (VITAMIN D) 1000 UNITS tablet Take 1,000 Units by mouth every morning.    11/02/2016 at Unknown time  . fenofibrate (TRICOR) 145 MG tablet Take 145 mg by mouth every morning.   11/02/2016 at Unknown  time  . ferrous sulfate 325 (65 FE) MG tablet Take 325 mg by mouth daily with breakfast.   11/02/2016 at Unknown time  . furosemide (LASIX) 20 MG tablet Take 1 tablet (20 mg total) by mouth daily. (Patient taking differently: Take 40-60 mg by mouth See admin instructions. Take 2 tab a day, and if patient gains weight, takes 1 more tab) 30 tablet 2 11/02/2016 at Unknown time  . insulin glargine (LANTUS) 100 UNIT/ML injection Inject 43 Units into the skin at bedtime.   11/01/2016 at 1900  . Multiple Vitamins-Minerals (PRESERVISION AREDS) CAPS Take 1 capsule by mouth 2 (two) times daily.   Past Week at Unknown time  . nitroGLYCERIN (NITROSTAT) 0.4 MG SL tablet Place 0.4 mg under the tongue every 5 (five) minutes as needed for chest pain.   11/01/2016 at 1800  . Omega-3 Fatty Acids (FISH OIL) 1000 MG CAPS Take 1,000 mg by mouth daily.    11/02/2016 at Unknown time  . omeprazole (PRILOSEC) 20 MG capsule Take 20 mg by mouth every morning.    11/02/2016 at Unknown time  . oxybutynin (DITROPAN-XL) 5 MG 24 hr tablet Take 5 mg by mouth at bedtime.    11/01/2016 at Unknown time  . traMADol (ULTRAM) 50 MG tablet Take 1 tablet (50 mg total) by mouth every 6 (six) hours as needed for moderate pain. 30 tablet 0 Past Month at Unknown time  . warfarin (COUMADIN) 4 MG tablet Take 2-4 mg by mouth See admin instructions. Takes 1 tab everyday except Sundays; takes 2 mg (1/2 tab) on Sundays   11/01/2016 at 1900  . acetaminophen (TYLENOL) 500 MG tablet Take 1,000 mg by mouth every 6 (six) hours as needed for moderate pain or headache.   10/31/2016   Assessment: Okay for Protocol,  INR now at goal.  No Coumadin given in several days due to elevated INR.  Goal of Therapy:  INR 2-3   Plan:  Coumadin 4mg  today x 1 dose Daily PT / INR.  Monitor for signs and symptoms of bleeding.    [place] Crane, Shauntel Prest A 11/05/2016,10:10 AM

## 2016-11-05 NOTE — Progress Notes (Addendum)
Progress Note  Patient Name: Summer Wong Date of Encounter: 11/05/2016  Primary Cardiologist: Gennette Pac, MD [facility]). Seen originally by Dr.McDowell on consultation.  Subjective   Multiple somatic complaints. Abdominal pain,mild nausea, weakness. No BM for two days.   Inpatient Medications    Scheduled Meds: . amLODipine  10 mg Oral q morning - 10a  . aspirin EC  81 mg Oral q morning - 10a  . atorvastatin  40 mg Oral QHS  . carvedilol  25 mg Oral BID WC  . cholecalciferol  1,000 Units Oral q morning - 10a  . fenofibrate  160 mg Oral Daily  . ferrous sulfate  325 mg Oral Q breakfast  . insulin aspart  0-15 Units Subcutaneous TID WC  . insulin aspart  0-5 Units Subcutaneous QHS  . insulin glargine  30 Units Subcutaneous QHS  . isosorbide mononitrate  15 mg Oral Daily  . loratadine  10 mg Oral Daily  . multivitamin-lutein  1 capsule Oral BID  . omega-3 acid ethyl esters  1 g Oral Daily  . oxybutynin  5 mg Oral QHS  . pantoprazole  40 mg Oral Daily  . polyethylene glycol  17 g Oral BID  . potassium chloride  20 mEq Oral Daily  . sodium chloride flush  3 mL Intravenous Q12H   Continuous Infusions:  PRN Meds: acetaminophen, albuterol, nitroGLYCERIN, ondansetron (ZOFRAN) IV, traMADol   Vital Signs    Vitals:   11/04/16 0635 11/04/16 1432 11/04/16 2113 11/05/16 0602  BP: 120/63 128/71 (!) 110/57 (!) 124/58  Pulse: 80 90 90 84  Resp: 16 18 20 20   Temp: 98.2 F (36.8 C) 98.4 F (36.9 C) 99.1 F (37.3 C) 98.9 F (37.2 C)  TempSrc: Axillary Oral Oral Oral  SpO2: 98% 97% 96% 95%  Weight: 195 lb 12.7 oz (88.8 kg)   194 lb 12.8 oz (88.4 kg)  Height:        Intake/Output Summary (Last 24 hours) at 11/05/16 0821 Last data filed at 11/05/16 0624  Gross per 24 hour  Intake              598 ml  Output             1550 ml  Net             -952 ml   Filed Weights   11/03/16 0656 11/04/16 0635 11/05/16 0602  Weight: 195 lb 13.7 oz (88.8 kg) 195 lb 12.7 oz (88.8 kg) 194  lb 12.8 oz (88.4 kg)    Telemetry    Not on telemetry  Physical Exam   GEN: No acute distress.  Frail  Neck: No JVD Cardiac: RRR, 1/6 systolic murmur, rubs, or gallops.  Respiratory: Clear to auscultation bilaterally. GI: Soft, nontender, non-distended Normal bowel sounds.  MS: No edema; No deformity. Neuro:  Nonfocal  Psych: Normal affect   Labs    Chemistry Recent Labs Lab 11/03/16 0513 11/04/16 0527 11/05/16 0559  NA 141 138 137  K 3.4* 3.7 3.9  CL 102 98* 97*  CO2 30 30 31   GLUCOSE 111* 161* 139*  BUN 35* 37* 38*  CREATININE 1.49* 1.61* 1.77*  CALCIUM 8.7* 8.7* 8.6*  GFRNONAA 30* 27* 24*  GFRAA 35* 32* 28*  ANIONGAP 9 10 9      Hematology Recent Labs Lab 11/02/16 0827 11/04/16 0527  WBC 8.6 9.4  RBC 4.68 3.79*  HGB 12.6 10.4*  HCT 39.1 31.8*  MCV 83.5 83.9  MCH 26.9 27.4  MCHC 32.2 32.7  RDW 15.2 15.2  PLT 227 194    Cardiac Enzymes  BNP Recent Labs Lab 11/02/16 1207  BNP 239.0*     DDimer No results for input(s): DDIMER in the last 168 hours.   Radiology    Dg Chest 2 View  Result Date: 11/04/2016 CLINICAL DATA:  Dyspnea. EXAM: CHEST  2 VIEW COMPARISON:  Radiograph of November 02, 2016. FINDINGS: The heart size and mediastinal contours are within normal limits. Atherosclerosis of thoracic aorta is noted. Left-sided pacemaker is unchanged in position. No pneumothorax or pleural effusion is noted. Mild interstitial prominence is noted which most likely represents chronic scarring or chronic lung disease, but superimposed edema or inflammation cannot be excluded. The visualized skeletal structures are unremarkable. IMPRESSION: Aortic atherosclerosis. Stable mild interstitial densities bilaterally most consistent with chronic scarring or lung disease, but superimposed acute edema or inflammation cannot be excluded. Electronically Signed   By: Marijo Conception, M.D.   On: 11/04/2016 10:50   Dg Abd 1 View  Result Date: 11/04/2016 CLINICAL DATA:   Abdominal pain this morning. EXAM: ABDOMEN - 1 VIEW COMPARISON:  CT abdomen and pelvis 12/14/2014. FINDINGS: The bowel gas pattern is nonobstructive. Prominent stool burden in the descending and rectosigmoid colon is noted. Convex left scoliosis is seen. The patient is status post cholecystectomy. IMPRESSION: No acute abnormality. Prominent stool burden descending and rectosigmoid colon. Scoliosis. Electronically Signed   By: Inge Rise M.D.   On: 11/04/2016 10:50   Nm Myocar Multi W/spect W/wall Motion / Ef  Result Date: 11/03/2016  There was no ST segment deviation noted during stress. Baseline T-wave inversions at rest and post-injection  Findings consistent with prior mild to moderate inferolateral myocardial infarction with mild peri-infarct ischemia. There is a significant amount of adjacent gut radiotracer uptake that may affect the defect.  The left ventricular ejection fraction is normal (55-65%).  Overall low risk findings, fairly mild amount of myocardium currently at jeopardy.     Cardiac Studies   NM Stress test 11/03/2016   There was no ST segment deviation noted during stress. Baseline T-wave inversions at rest and post-injection  Findings consistent with prior mild to moderate inferolateral myocardial infarction with mild peri-infarct ischemia. There is a significant amount of adjacent gut radiotracer uptake that may affect the defect.  The left ventricular ejection fraction is normal (55-65%).  Overall low risk findings, fairly mild amount of myocardium currently at jeopardy.     Patient Profile     83 y.o. 83 y.o.femalewith a historyof persistent atrial fibrillation on Coumadin, sick sinus syndrome status post pacemaker placement, chronic diastolic heart failure, and previous silent myocardial infarction with negative ischemic workup in 2015who is being seen today for the evaluation of chest pain.  Assessment & Plan    1. Chest Pain: Low risk Lexiscan  Myoview. No plans for invasive testing. Started on Imdur for angina symptoms. No complaints of chest pain this am.   2. Acute on Chronic Diastolic CHF: Diuresed 3.2 liters since admission. Not on diuretics currently due to worsening renal function. No evidence of decompensation.   3. CKD: Stage Stage IV: GFR 24. Creatinine 1.77. May be contributing to nausea. Diuretics on hold. On Miralax for constipation. Will get her up OOB and ambulate her. OOB in the chair today.   4. Atrial fib: Not on telemetry currently. On coumadin per pharmacy. INR 2.49.   5. Deconditioning; Consider PT consultation.   Signed, Summer Wong, ANP, AACC  11/05/2016, 8:21 AM   Patient seen and discussed with DNP Purcell Nails, I agree with her documentation above. No further cardiology recs at this time, we will sign off inpatient care.  Would resume her home diuretics once renal fucntion improves  J Catrell Morrone MD

## 2016-11-05 NOTE — Care Management (Signed)
PT recommends OP PT, however patient states that she will not have transportation and would like Home health. Offered choice of home health agencies. Romualdo Bolk of [facility] notifed and will obtain orders from chart. Patient aware that [facility] has 48 hours to initiate services post discharge from hospital.

## 2016-11-05 NOTE — Progress Notes (Signed)
PROGRESS NOTE  Summer Wong RFF:[id number] DOB: [birth year] DOA: 11/02/2016 PCP: [facility]  Brief History:  83 year old female with a history of diastolic CHF, sick sinus syndrome status post pacemaker, atrial fibrillation, diabetes mellitus, hyperlipidemia, hypertension presents with one-day history of chest discomfort and shortness of breath. Around 6 PM on 11/01/2016, the patient developed chest discomfort down her left arm with associated shortness of breath while watching television. The patient took 3 nitroglycerin after which it improved. On the morning of 11/02/2016, the patient had chest discomfort once again with some shortness of breath. As a result, she presents to emergency department for further evaluation. During this past week, she has not had to use any nitroglycerin.  She states that her weight has been stable with her maximum weight 195.6 pounds this past week. She endorses some dietary indiscretion. She states that she has been compliant with her usual furosemide 40 mg daily. She feels that her lower extremity edema is about the same as usual.  She follows cardiology, Dr. Gennette Pac at [facility]. Her last appointment on 10/15/2016, the patient had a weight of 196 pounds. The patient was admitted to the hospital from 02/27/2016 through 02/29/2016 with acute on chronic diastolic CHF. Her discharge weight was 192 pounds.  Assessment/Plan: Acute on chronic diastolic CHF- chest x-ray suggests congestion, crackles on exam, SOB with ambulation, but maintaining saturating 96% on room air at rest and with ambulation, lower extremity swelling- resolved. Creatinine trended up with diuresis. ?deconditioning. Doubt thromboembolic disease as patient has been supratherapeutic on warfarin. - Hold IV Lasix for now  - daily weights- minimal-change 194, this is not far from her ?"baseline", ?"192" -7/9 CXR--peribronchial thickening suggesting interstitial  edema -NEG ~3L since admission -fluid restrict -Echo--7/9 EF 60-65%, no WMA, mild TR, mild-mod MR -Increase in Creatinine 1.7, bicarbonate 31. - Appreciate cardiology recommendations - BMP am - PT eval - Out-Pt PT  Chest pain- troponins and EKG unremarkable  -11/03/16--myoview lexiscan--low risk, possible mild peri-infarct ischemia -appreciate cardiology followup-->manage medically  Abdominal pain, nausea- likely from constipation, Seen on abdominal x-ray- CBC shows stable white blood count- 9.4 - Lipase normal at 32 - UA- positive nitrite, rare bacteria, negative leukocytes., Will treat with Keflex X5 days - Continue MiraLAX, add Senokot, add Dulcolax suppository  CKD stage 3- 1.7. baseline creatinine ~1.3-1.5 - Diuretics held  Essential hypertension -Continue amlodipine, carvedilol  Diabetes mellitus type 2 hemoglobin A1c--8.0- 7/9. Stable CBGs -Continue reduced dose Lantus -NovoLog sliding scale -CBG well controlled during the hospitalization suggesting dietary indiscretion  Hyperlipidemia -Continue statin, fenofibrate, and fish oil  Atrial fibrillation- Rate controlled. INR- 2.49 -Continue warfarin -Continue carvedilol  Supratherapeutic INR- stable Hgb, no signs of bleeding. INR 2.49. -Pharmacy consult  SSS -s/p PPM  Disposition Plan:   Pending improvement in respiratory status and diuresis.   Family Communication:   No family at bedside  Consultants:  Cardiology  Code Status:  FULL / DNR  DVT Prophylaxis:  Coumadin  Procedures: As Listed in Progress Note Above  Antibiotics: None  Subjective: Still has abdominal pain, no bowel movement yet.   Objective: Vitals:   11/05/16 0602 11/05/16 0900 11/05/16 1135 11/05/16 1141  BP: (!) 124/58     Pulse: 84  93   Resp: 20     Temp: 98.9 F (37.2 C)     TempSrc: Oral     SpO2: 95% 98% 96% 96%  Weight: 88.4 kg (194 lb 12.8  oz)     Height:        Intake/Output Summary (Last 24 hours) at  11/05/16 1414 Last data filed at 11/05/16 1300  Gross per 24 hour  Intake              718 ml  Output             2050 ml  Net            -1332 ml   Weight change: -0.449 kg (-15.9 oz) Exam:   General:  Pt is alert, follows commands appropriately, not in acute distress  HEENT: No icterus, No thrush, No neck mass, College/AT  Cardiovascular: RRR, S1/S2, no rubs, no gallops  Respiratory: Mild crackles- anteriorly, and bibasilar, No wheeze. Normal work of breathing  Abdomen: Soft/+BS, minimal tenderness periumbilical and suprapubic tender, abdomen full, no guarding  Extremities: No LE edema, No lymphangitis, No petechiae, No rashes, no synovitis  Data Reviewed: I have personally reviewed following labs and imaging studies Basic Metabolic Panel:  Recent Labs Lab 11/02/16 0827 11/03/16 0513 11/03/16 1026 11/04/16 0527 11/05/16 0559  NA 141 141  --  138 137  K 3.8 3.4*  --  3.7 3.9  CL 102 102  --  98* 97*  CO2 30 30  --  30 31  GLUCOSE 163* 111*  --  161* 139*  BUN 36* 35*  --  37* 38*  CREATININE 1.55* 1.49*  --  1.61* 1.77*  CALCIUM 9.3 8.7*  --  8.7* 8.6*  MG  --   --  1.7 2.1  --     Recent Labs Lab 11/04/16 0527  LIPASE 32   Coagulation Profile:  Recent Labs Lab 11/02/16 0827 11/03/16 1026 11/04/16 0527 11/05/16 0559  INR 5.40* 4.19* 3.59 2.49   CBC:  Recent Labs Lab 11/02/16 0827 11/04/16 0527  WBC 8.6 9.4  NEUTROABS 5.2  --   HGB 12.6 10.4*  HCT 39.1 31.8*  MCV 83.5 83.9  PLT 227 194   Cardiac Enzymes:  Recent Labs Lab 11/02/16 0827 11/02/16 1207 11/02/16 1947  TROPONINI <0.03 <0.03 <0.03   CBG:  Recent Labs Lab 11/04/16 1133 11/04/16 1606 11/04/16 2111 11/05/16 0724 11/05/16 1130  GLUCAP 161* 134* 162* 140* 147*   HbA1C: No results for input(s): HGBA1C in the last 72 hours. Urine analysis:    Component Value Date/Time   COLORURINE YELLOW 11/05/2016 0620   APPEARANCEUR CLEAR 11/05/2016 0620   LABSPEC 1.010 11/05/2016  0620   PHURINE 5.0 11/05/2016 0620   GLUCOSEU NEGATIVE 11/05/2016 0620   HGBUR SMALL (A) 11/05/2016 0620   BILIRUBINUR NEGATIVE 11/05/2016 0620   KETONESUR NEGATIVE 11/05/2016 0620   PROTEINUR NEGATIVE 11/05/2016 0620   UROBILINOGEN 0.2 08/30/2014 1838   NITRITE POSITIVE (A) 11/05/2016 0620   LEUKOCYTESUR NEGATIVE 11/05/2016 0620   Scheduled Meds: . amLODipine  10 mg Oral q morning - 10a  . aspirin EC  81 mg Oral q morning - 10a  . atorvastatin  40 mg Oral QHS  . carvedilol  25 mg Oral BID WC  . cholecalciferol  1,000 Units Oral q morning - 10a  . fenofibrate  160 mg Oral Daily  . ferrous sulfate  325 mg Oral Q breakfast  . insulin aspart  0-15 Units Subcutaneous TID WC  . insulin aspart  0-5 Units Subcutaneous QHS  . insulin glargine  30 Units Subcutaneous QHS  . isosorbide mononitrate  15 mg Oral Daily  . loratadine  10 mg Oral  Daily  . multivitamin-lutein  1 capsule Oral BID  . omega-3 acid ethyl esters  1 g Oral Daily  . oxybutynin  5 mg Oral QHS  . pantoprazole  40 mg Oral Daily  . polyethylene glycol  17 g Oral BID  . potassium chloride  20 mEq Oral Daily  . sodium chloride flush  3 mL Intravenous Q12H  . warfarin  4 mg Oral Once  . Warfarin - Pharmacist Dosing Inpatient   Does not apply Q24H   Continuous Infusions:   Procedures/Studies: Dg Chest 2 View  Result Date: 11/04/2016 CLINICAL DATA:  Dyspnea. EXAM: CHEST  2 VIEW COMPARISON:  Radiograph of November 02, 2016. FINDINGS: The heart size and mediastinal contours are within normal limits. Atherosclerosis of thoracic aorta is noted. Left-sided pacemaker is unchanged in position. No pneumothorax or pleural effusion is noted. Mild interstitial prominence is noted which most likely represents chronic scarring or chronic lung disease, but superimposed edema or inflammation cannot be excluded. The visualized skeletal structures are unremarkable. IMPRESSION: Aortic atherosclerosis. Stable mild interstitial densities bilaterally  most consistent with chronic scarring or lung disease, but superimposed acute edema or inflammation cannot be excluded. Electronically Signed   By: Marijo Conception, M.D.   On: 11/04/2016 10:50   Dg Abd 1 View  Result Date: 11/04/2016 CLINICAL DATA:  Abdominal pain this morning. EXAM: ABDOMEN - 1 VIEW COMPARISON:  CT abdomen and pelvis 12/14/2014. FINDINGS: The bowel gas pattern is nonobstructive. Prominent stool burden in the descending and rectosigmoid colon is noted. Convex left scoliosis is seen. The patient is status post cholecystectomy. IMPRESSION: No acute abnormality. Prominent stool burden descending and rectosigmoid colon. Scoliosis. Electronically Signed   By: Inge Rise M.D.   On: 11/04/2016 10:50   Nm Myocar Multi W/spect W/wall Motion / Ef  Result Date: 11/03/2016  There was no ST segment deviation noted during stress. Baseline T-wave inversions at rest and post-injection  Findings consistent with prior mild to moderate inferolateral myocardial infarction with mild peri-infarct ischemia. There is a significant amount of adjacent gut radiotracer uptake that may affect the defect.  The left ventricular ejection fraction is normal (55-65%).  Overall low risk findings, fairly mild amount of myocardium currently at jeopardy.    Dg Chest Port 1 View  Result Date: 11/02/2016 CLINICAL DATA:  Chest pain EXAM: PORTABLE CHEST 1 VIEW COMPARISON:  02/27/2016 FINDINGS: Heart is borderline in size. Mild peribronchial thickening and interstitial prominence. No confluent opacity or effusion. Left pacer is unchanged. IMPRESSION: Borderline heart size. Mild peribronchial thickening and interstitial prominence could reflect bronchitis or early interstitial edema. Electronically Signed   By: Rolm Baptise M.D.   On: 11/02/2016 08:47    Kalynne Womac Arlyce Dice, MD  [facility] Pager [phone]  If 7PM-7AM, please contact night-coverage [email or web address] Password [email or web address] 11/05/2016, 2:14 PM    LOS: 3 days

## 2016-11-05 NOTE — Care Management Note (Signed)
Case Management Note  Patient Details  Name: [name] MRN: [id number] Date of Birth: June 07, 1925  Additional Comments: CM following for oxygen needs.   Olof Marcil, Chauncey Reading, RN 11/05/2016, 11:24 AM

## 2016-11-05 NOTE — Evaluation (Signed)
Physical Therapy Evaluation Patient Details Name: Summer Wong MRN: [id number] DOB: 1926/04/08 Today's Date: 11/05/2016   History of Present Illness  Summer Wong is an 83yo white female who comes to [facility] on 7/9 after 1D CP c SOB. PMH: HTN, HLD, DCHF, s/p PPM, DM. At baseline pt lives alone, fully independent community dwelling adult, still driving, no falls, reportedly good balance.   Clinical Impression  Pt admitted with above diagnosis. Pt currently with functional limitations due to the deficits listed below (see "PT Problem List"). Upon entry, the patient is received up in chair, no family/caregiver present. The pt is awake and agreeable to participate. Pt c/o pain in lower ABD 3-4/10 which increases to 5/10 with transfers and AMB.Pt received on 2LPM, trialed on room air, with SpO2>95% throughout, but 3 instances of DOE, which cease AMB. AMB is slow and labored, but mostly antalgic due to ABD pain. Pt will benefit from skilled PT intervention to increase independence and safety with basic mobility in preparation for discharge to the venue listed below.       Follow Up Recommendations Outpatient PT    Equipment Recommendations  None recommended by PT    Recommendations for Other Services       Precautions / Restrictions Precautions Precautions: None      Mobility  Bed Mobility               General bed mobility comments: received up in chair  Transfers Overall transfer level: Modified independent Equipment used: None             General transfer comment: slow and antalgic   Ambulation/Gait Ambulation/Gait assistance: Min guard Ambulation Distance (Feet): 120 Feet Assistive device: None Gait Pattern/deviations: Antalgic     General Gait Details: left compensated trendelenburg, pt reports she has 'bad knees'; stops 3 times due to SOB, but with normal SpO2  Stairs            Wheelchair Mobility    Modified Rankin (Stroke Patients Only)        Balance Overall balance assessment: Modified Independent;No apparent balance deficits (not formally assessed)                                           Pertinent Vitals/Pain Pain Assessment: 0-10 Pain Score: 5  Pain Location: lower ABD Pain Intervention(s): Limited activity within patient's tolerance;Monitored during session    [place] expects to be discharged to:: Private residence Living Arrangements: Alone Available Help at Discharge: Family Type of Home: House Home Access: Stairs to enter Entrance Stairs-Rails: Can reach both Entrance Stairs-Number of Steps: 3 Home Layout: One level Home Equipment: Walker - 2 wheels;Cane - single point      Prior Function Level of Independence: Independent               Hand Dominance        Extremity/Trunk Assessment   Upper Extremity Assessment Upper Extremity Assessment: Overall WFL for tasks assessed    Lower Extremity Assessment Lower Extremity Assessment: Overall WFL for tasks assessed       Communication   Communication: No difficulties  Cognition Arousal/Alertness: Awake/alert Behavior During Therapy: WFL for tasks assessed/performed Overall Cognitive Status: Within Functional Limits for tasks assessed  General Comments      Exercises     Assessment/Plan    PT Assessment Patient needs continued PT services  PT Problem List Decreased activity tolerance;Decreased balance;Decreased strength;Obesity;Decreased mobility       PT Treatment Interventions Gait training;Functional mobility training;Therapeutic exercise;Therapeutic activities    PT Goals (Current goals can be found in the Care Plan section)  Acute Rehab PT Goals Patient Stated Goal: return to community distance AMB s SOB/weakness  PT Goal Formulation: With patient Time For Goal Achievement: 11/19/16 Potential to Achieve Goals: Good    Frequency  Min 2X/week   Barriers to discharge        Co-evaluation               AM-PAC PT "6 Clicks" Daily Activity  Outcome Measure Difficulty turning over in bed (including adjusting bedclothes, sheets and blankets)?: A Little Difficulty moving from lying on back to sitting on the side of the bed? : A Little Difficulty sitting down on and standing up from a chair with arms (e.g., wheelchair, bedside commode, etc,.)?: A Little Help needed moving to and from a bed to chair (including a wheelchair)?: A Lot Help needed walking in hospital room?: A Lot Help needed climbing 3-5 steps with a railing? : A Lot 6 Click Score: 15    End of Session Equipment Utilized During Treatment: Gait belt Activity Tolerance: Patient limited by pain;Other (comment) (limited by SOB ) Patient left: in chair;with call bell/phone within reach Nurse Communication: Mobility status;Other (comment) (O2Sats) PT Visit Diagnosis: Muscle weakness (generalized) (M62.81);Difficulty in walking, not elsewhere classified (R26.2)    Time: 5462-7035 PT Time Calculation (min) (ACUTE ONLY): 21 min   Charges:   PT Evaluation $PT Eval Moderate Complexity: 1 Procedure     PT G Codes:        2:32 PM, 04-Dec-2016 Etta Grandchild, PT, DPT Physical Therapist - [place] [phone] (Office)    Chau Sawin C 12-04-2016, 2:29 PM

## 2016-11-06 DIAGNOSIS — N183 Chronic kidney disease, stage 3 (moderate): Secondary | ICD-10-CM

## 2016-11-06 DIAGNOSIS — E1165 Type 2 diabetes mellitus with hyperglycemia: Secondary | ICD-10-CM

## 2016-11-06 DIAGNOSIS — Z794 Long term (current) use of insulin: Secondary | ICD-10-CM

## 2016-11-06 LAB — GLUCOSE, CAPILLARY
Glucose-Capillary: 159 mg/dL — ABNORMAL HIGH (ref 65–99)
Glucose-Capillary: 153 mg/dL — ABNORMAL HIGH (ref 65–99)

## 2016-11-06 LAB — PROTIME-INR
INR: 2.03
Prothrombin Time: 23.3 seconds — ABNORMAL HIGH (ref 11.4–15.2)

## 2016-11-06 LAB — BASIC METABOLIC PANEL
Anion gap: 10 (ref 5–15)
BUN: 41 mg/dL — AB (ref 6–20)
CALCIUM: 8.7 mg/dL — AB (ref 8.9–10.3)
CO2: 30 mmol/L (ref 22–32)
CREATININE: 1.98 mg/dL — AB (ref 0.44–1.00)
Chloride: 96 mmol/L — ABNORMAL LOW (ref 101–111)
GFR calc non Af Amer: 21 mL/min — ABNORMAL LOW (ref >60)
GFR, EST AFRICAN AMERICAN: 25 mL/min — AB (ref >60)
Glucose, Bld: 137 mg/dL — ABNORMAL HIGH (ref 65–99)
Potassium: 4 mmol/L (ref 3.5–5.1)
Sodium: 136 mmol/L (ref 135–145)

## 2016-11-06 LAB — BRAIN NATRIURETIC PEPTIDE: B Natriuretic Peptide: 262 pg/mL — ABNORMAL HIGH (ref 0.0–100.0)

## 2016-11-06 MED ORDER — ISOSORBIDE MONONITRATE ER 30 MG PO TB24
15.0000 mg | ORAL_TABLET | Freq: Every day | ORAL | 0 refills | Status: AC
Start: 2016-11-07 — End: 2018-02-14

## 2016-11-06 MED ORDER — CEPHALEXIN 500 MG PO CAPS: 500.0000 mg | ORAL_CAPSULE | Freq: Two times a day (BID) | ORAL | 0 refills | Status: AC

## 2016-11-06 MED ORDER — WARFARIN SODIUM 2 MG PO TABS: 4.0000 mg | ORAL_TABLET | Freq: Once | ORAL | Status: DC

## 2016-11-06 MED ORDER — WARFARIN SODIUM 2 MG PO TABS: 2.0000 mg | ORAL_TABLET | Freq: Every day | ORAL | Status: DC

## 2016-11-06 NOTE — Care Management Important Message (Signed)
Important Message  Patient Details  Name: Summer Wong MRN: [id number] Date of Birth: 04-May-1925   Medicare Important Message Given:  Yes    Sherald Barge, RN 11/06/2016, 2:30 PM

## 2016-11-06 NOTE — Care Management Note (Signed)
Case Management Note  Patient Details  Name: Summer Wong MRN: [id number] Date of Birth: [birth year]  Expected Discharge Date:  11/06/16               Expected Discharge Plan:  [place]  In-House Referral:  NA  Discharge planning Services  CM Consult  Post Acute Care Choice:  Home Health Choice offered to:  Patient  HH Arranged:  PT, RN [facility] Agency:  [facility]  Status of Service:  Completed, signed off  Additional Comments: Pt discharging home today. Family at bedside. Pt aware HH has 48hrs to make first visit. No other needs communicated. [facility] rep, aware of DC today.   Sherald Barge, RN 11/06/2016, 2:29 PM

## 2016-11-06 NOTE — Discharge Summary (Signed)
Physician Discharge Summary  Mele Sylvester NKN:[id number] DOB: 04/25/1926 DOA: 11/02/2016  PCP: [facility] date: 11/02/2016 Discharge date: 11/06/2016  Admitted From: Home Disposition:  Home   Recommendations for Outpatient Follow-up:  1. Follow up with PCP by 11/09/16 2. Please obtain BMP, INR 11/09/16 3. Follow up with cardiologist in 1 week.  Home Health: yes Equipment/Devices: None  Discharge Condition:Stable CODE STATUS: Full  Diet recommendation: Heart Healthy  HPI- By Dr. Carles Collet - 83 year old female with a history of diastolic CHF, sick sinus syndrome status post pacemaker, atrial fibrillation, diabetes mellitus, hyperlipidemia, hypertension presents with one-day history of chest discomfort and shortness of breath. Around 6 PM on 11/01/2016, the patient developed chest discomfort down her left arm with associated shortness of breath while watching television. The patient took 3 nitroglycerin after which it improved. On the morning of 11/02/2016, the patient had chest discomfort once again with some shortness of breath. As a result, she presents to emergency department for further evaluation. During this past week, she has not had to use any nitroglycerin. She denies any fevers, chills, nausea, vomiting, diarrhea, coughing, hemoptysis, abdominal pain, dysuria, hematuria. She states that her weight has been stable with her maximum weight 195.6 pounds this past week. She endorses some dietary indiscretion. She states that she has been compliant with her furosemide 40 mg daily. She feels that her lower extremity edema is about the same as usual.  She follows cardiology, Dr. Gennette Pac at [facility]. Her last appointment on 10/15/2016, the patient had a weight of 196 pounds. The patient was admitted to the hospital from 02/27/2016 through 02/29/2016 with acute on chronic diastolic CHF. Her discharge weight was 192 pounds.  In the emergency department, the patient  was afebrile and hemodynamically stable saturating 95% on room air. She was given one nitroglycerin with improvement of her chest discomfort. Serum creatinine was at baseline of 1.55. CBC was unremarkable. INR was 5.40. Chest x-ray showed mild peribronchial thickening  Discharge Diagnoses:   Acute on chronic diastolic CHF-  chest x-ray with peribronchial thickening suggesting interstitial edema. Echo--7/9 EF 60-65%, no WMA, mild TR, mild-mod MR. Weight on admission 195, was 192 after previous admission 02/2016. Patient was aggressively diuresed with IV Lasix, till bump in creatinine and increased bicarbonate, and lower extremity swelling- resolved. Chest x-ray showed improvement in pulmonary edema. BNP on admission 239, was 262 on discharge. Doubt thromboembolic disease as patient has been supratherapeutic on warfarin. Creatinine on admission -1.55, creatinine peaked at 1.9. Baseline creatinine appears to be-1.3- 1.5. Cardiology was consulted while inpatient to help with diuresis. Patient's discharge weight was 192 pounds. Patient was ambulated on room air O2 sats remained above >95%. Patient to follow-up with primary care provider and cardiologist early next week, for BMP check before resuming home Lasix 40. Patient and patient's son voiced understanding. Patient was hence discharged to home, with home health physical therapy.  Chest pain- troponins and EKG unremarkable. 11/03/16--myoview lexiscan--low risk, possible mild peri-infarct ischemia. Cardiology recommendations for medical management, and Imdur 15 mg daily was Added for anginal symptoms. No ACE-I/ARB Due to allergy.  Abdominal pain, nausea- likely from UTI and constipation, Seen on abdominal x-ray- CBC shows stable white blood count- 9.4. No fevers no diarrhea. Lipase normal at 32. UA- positive nitrite, rare bacteria, negative leukocytes. Patient was also given MiraLAX and Dulcolax and Senokot with subsequent multiple bowel movements. Patient was  discharged to home to complete 5 day course of Keflex.   Essential hypertension-  Continue amlodipine, carvedilol on admission and on discharge.   Diabetes mellitus type 2 hemoglobin A1c--8.0- 7/9. Continue home Lantus on discharge.  Hyperlipidemia- Continue statin, and fish oil. Patient's fenofibrate dose was adjusted for renal insufficiency on discharge.  Atrial fibrillation-anticoagulation with warfarin. Rate controlled with Coreg.  Supratherapeutic INR- INR stable Hgb, no signs of bleeding. INR 5.4 on admission. Hemoglobin was stable, no bleeding. Warfarin was therefore held and resumed prior to discharge. Talked to pharmacist- with recommendation for 2 mg of warfarin daily and INR checked early next week. Patient's and patient's son voiced understanding.  SSS s/p PPM  Discharge Instructions  Discharge Instructions    Diet - low sodium heart healthy    Complete by:  As directed    Discharge instructions    Complete by:  As directed    We have changed the dose of your warfarin- Take 2mg  tablet starting tomorrow.  You will need your INR checked again on Monday- this is very important. Also your kidney function needs to be rechecked on Monday. So you will need to follow up with Primary Care provider by Monday and with your cardiologist in 1 week.   Do not take LASIX or Fluid pill till you follow-up with your primary care provider and have your kidney numbers checked.  Please complete 3 and a half more days of your antibiotics for a urinary tract infection.  Our cardiologist here also started you on a medication called imdur/isosorbide to help with chest pain.  Fenofibrate was stopped for now, till your kidney numbers are rechecked.   Increase activity slowly    Complete by:  As directed      Allergies as of 11/06/2016      Reactions   Enalapril Maleate Swelling   Throat swelling   Sulfa Antibiotics Other (See Comments)   Does not remember.    Vasotec [enalaprilat]  Swelling   Tongue.       Medication List    STOP taking these medications   fenofibrate 145 MG tablet Commonly known as:  TRICOR   furosemide 20 MG tablet Commonly known as:  LASIX     TAKE these medications   acetaminophen 500 MG tablet Commonly known as:  TYLENOL Take 1,000 mg by mouth every 6 (six) hours as needed for moderate pain or headache.   albuterol 108 (90 Base) MCG/ACT inhaler Commonly known as:  PROVENTIL HFA;VENTOLIN HFA Inhale 1-2 puffs into the lungs every 6 (six) hours as needed for wheezing or shortness of breath.   amLODipine 10 MG tablet Commonly known as:  NORVASC Take 10 mg by mouth every morning.   aspirin EC 81 MG tablet Take 81 mg by mouth every morning.   atorvastatin 40 MG tablet Commonly known as:  LIPITOR Take 40 mg by mouth at bedtime.   carvedilol 25 MG tablet Commonly known as:  COREG Take 25 mg by mouth 2 (two) times daily with a meal.   cephALEXin 500 MG capsule Commonly known as:  KEFLEX Take 1 capsule (500 mg total) by mouth every 12 (twelve) hours.   cetirizine 10 MG tablet Commonly known as:  ZYRTEC Take 10 mg by mouth every morning.   cholecalciferol 1000 units tablet Commonly known as:  VITAMIN D Take 1,000 Units by mouth every morning.   ferrous sulfate 325 (65 FE) MG tablet Take 325 mg by mouth daily with breakfast.   Fish Oil 1000 MG Caps Take 1,000 mg by mouth daily.   insulin glargine 100 UNIT/ML  injection Commonly known as:  LANTUS Inject 43 Units into the skin at bedtime.   isosorbide mononitrate 30 MG 24 hr tablet Commonly known as:  IMDUR Take 0.5 tablets (15 mg total) by mouth daily.   nitroGLYCERIN 0.4 MG SL tablet Commonly known as:  NITROSTAT Place 0.4 mg under the tongue every 5 (five) minutes as needed for chest pain.   omeprazole 20 MG capsule Commonly known as:  PRILOSEC Take 20 mg by mouth every morning.   oxybutynin 5 MG 24 hr tablet Commonly known as:  DITROPAN-XL Take 5 mg by mouth  at bedtime.   PRESERVISION AREDS Caps Take 1 capsule by mouth 2 (two) times daily.   traMADol 50 MG tablet Commonly known as:  ULTRAM Take 1 tablet (50 mg total) by mouth every 6 (six) hours as needed for moderate pain.   warfarin 2 MG tablet Commonly known as:  COUMADIN Take 1 tablet (2 mg total) by mouth daily. What changed:  medication strength  how much to take  when to take this  additional instructions      Follow-up Information    [facility] Schedule an appointment as soon as possible for a visit on 11/09/2016.   Contact information: [address] [place] [address] [phone]        Creta Levin, MD. Schedule an appointment as soon as possible for a visit in 1 week(s).   Specialty:  Cardiology Contact information: [id number] [place] [address] [phone]          Allergies  Allergen Reactions  . Enalapril Maleate Swelling    Throat swelling  . Sulfa Antibiotics Other (See Comments)    Does not remember.   Michail Sermon [Enalaprilat] Swelling    Tongue.     Consultations:  Cardiology, Dr. Harl Bowie.  Procedures/Studies: Dg Chest 2 View  Result Date: 11/04/2016 CLINICAL DATA:  Dyspnea. EXAM: CHEST  2 VIEW COMPARISON:  Radiograph of November 02, 2016. FINDINGS: The heart size and mediastinal contours are within normal limits. Atherosclerosis of thoracic aorta is noted. Left-sided pacemaker is unchanged in position. No pneumothorax or pleural effusion is noted. Mild interstitial prominence is noted which most likely represents chronic scarring or chronic lung disease, but superimposed edema or inflammation cannot be excluded. The visualized skeletal structures are unremarkable. IMPRESSION: Aortic atherosclerosis. Stable mild interstitial densities bilaterally most consistent with chronic scarring or lung disease, but superimposed acute edema or inflammation cannot be excluded. Electronically  Signed   By: Marijo Conception, M.D.   On: 11/04/2016 10:50   Dg Abd 1 View  Result Date: 11/04/2016 CLINICAL DATA:  Abdominal pain this morning. EXAM: ABDOMEN - 1 VIEW COMPARISON:  CT abdomen and pelvis 12/14/2014. FINDINGS: The bowel gas pattern is nonobstructive. Prominent stool burden in the descending and rectosigmoid colon is noted. Convex left scoliosis is seen. The patient is status post cholecystectomy. IMPRESSION: No acute abnormality. Prominent stool burden descending and rectosigmoid colon. Scoliosis. Electronically Signed   By: Inge Rise M.D.   On: 11/04/2016 10:50   Nm Myocar Multi W/spect W/wall Motion / Ef  Result Date: 11/03/2016  There was no ST segment deviation noted during stress. Baseline T-wave inversions at rest and post-injection  Findings consistent with prior mild to moderate inferolateral myocardial infarction with mild peri-infarct ischemia. There is a significant amount of adjacent gut radiotracer uptake that may affect the defect.  The left ventricular ejection fraction is normal (55-65%).  Overall low risk findings, fairly mild amount of myocardium currently at jeopardy.    Dg Chest Port 1 View  Result Date: 11/02/2016 CLINICAL DATA:  Chest pain EXAM: PORTABLE CHEST 1 VIEW COMPARISON:  02/27/2016 FINDINGS: Heart is borderline in size. Mild peribronchial thickening and interstitial prominence. No confluent opacity or effusion. Left pacer is unchanged. IMPRESSION: Borderline heart size. Mild peribronchial thickening and interstitial prominence could reflect bronchitis or early interstitial edema. Electronically Signed   By: Rolm Baptise M.D.   On: 11/02/2016 08:47   Echocardiogram- 11/02/16 - Left ventricle: The cavity size was normal. Wall thickness was   increased in a pattern of mild LVH. Systolic function was normal.   The estimated ejection fraction was in the range of 60% to 65%.   Wall motion was normal; there were no regional wall motion   abnormalities.  The study is not technically sufficient to allow   evaluation of LV diastolic function. - Aortic valve: Mildly to moderately calcified annulus. Trileaflet;   mildly calcified leaflets. Left coronary cusp mobility was   restricted. Mean gradient (S): 9 mm Hg. Peak gradient (S): 17 mm   Hg. VTI ratio of LVOT to aortic valve: 0.42. - Mitral valve: Calcified annulus. There was mild to moderate   regurgitation directed eccentrically. - Left atrium: The atrium was mildly dilated. - Right ventricle: Pacer wire or catheter noted in right ventricle. - Right atrium: Central venous pressure (est): 3 mm Hg. - Tricuspid valve: There was mild regurgitation. - Pulmonary arteries: PA peak pressure: 35 mm Hg (S). - Pericardium, extracardiac: A prominent pericardial fat pad  Subjective: Patient's breathing is much better she is able to lie flat on the bed, and talk to me without dyspnea, on room air. No fever or vital stable  Discharge Exam: Vitals:   11/06/16 0609 11/06/16 1243  BP: 132/70 133/67  Pulse: 92 97  Resp: 20 20  Temp: 98.5 F (36.9 C) 97.9 F (36.6 C)   Vitals:   11/05/16 1500 11/05/16 2054 11/06/16 0609 11/06/16 1243  BP: (!) 118/51 (!) 108/46 132/70 133/67  Pulse: 92 90 92 97  Resp: 20 20 20 20   Temp: 98.4 F (36.9 C) 98.5 F (36.9 C) 98.5 F (36.9 C) 97.9 F (36.6 C)  TempSrc: Oral Oral Oral Oral  SpO2: 95% 92% 94% 97%  Weight:   87.5 kg (192 lb 14.4 oz)   Height:       General: Pt is alert, awake, not in acute distress Cardiovascular: RRR, S1/S2 +, no rubs, no gallops Respiratory: CTA bilaterally, no wheezing, Minimal crackles basilar. Abdominal: Soft, minimal tenderness to palpation suprapubic, ND, bowel sounds + Extremities: no edema, no cyanosis  The results of significant diagnostics from this hospitalization (including imaging, microbiology, ancillary and laboratory) are listed below for reference.    Labs: BNP (last 3 results)  Recent Labs  02/27/16 1525  11/02/16 1207 11/06/16 0615  BNP 258.0* 239.0* 850.2*   Basic Metabolic Panel:  Recent Labs Lab 11/02/16 0827 11/03/16 0513 11/03/16 1026 11/04/16 0527 11/05/16 0559 11/06/16 0615  NA 141 141  --  138 137 136  K 3.8 3.4*  --  3.7 3.9 4.0  CL 102 102  --  98* 97* 96*  CO2 30 30  --  30 31 30   GLUCOSE 163* 111*  --  161* 139* 137*  BUN 36* 35*  --  37* 38* 41*  CREATININE 1.55* 1.49*  --  1.61* 1.77* 1.98*  CALCIUM 9.3 8.7*  --  8.7* 8.6* 8.7*  MG  --   --  1.7 2.1  --   --     Recent Labs Lab 11/04/16 0527  LIPASE 32   CBC:  Recent Labs Lab 11/02/16 0827 11/04/16 0527  WBC 8.6 9.4  NEUTROABS 5.2  --   HGB 12.6 10.4*  HCT 39.1 31.8*  MCV 83.5 83.9  PLT 227 194   Cardiac Enzymes:  Recent Labs Lab 11/02/16 0827 11/02/16 1207 11/02/16 1947  TROPONINI <0.03 <0.03 <0.03   CBG:  Recent Labs Lab 11/05/16 1130 11/05/16 1617 11/05/16 2057 11/06/16 0759 11/06/16 1123  GLUCAP 147* 134* 144* 159* 153*   Urinalysis    Component Value Date/Time   COLORURINE YELLOW 11/05/2016 0620   APPEARANCEUR CLEAR 11/05/2016 0620   LABSPEC 1.010 11/05/2016 0620   PHURINE 5.0 11/05/2016 0620   GLUCOSEU NEGATIVE 11/05/2016 0620   HGBUR SMALL (A) 11/05/2016 0620   BILIRUBINUR NEGATIVE 11/05/2016 0620   KETONESUR NEGATIVE 11/05/2016 0620   PROTEINUR NEGATIVE 11/05/2016 0620   UROBILINOGEN 0.2 08/30/2014 1838   NITRITE POSITIVE (A) 11/05/2016 0620   LEUKOCYTESUR NEGATIVE 11/05/2016 0620   Time coordinating discharge: Over 30 minutes  SIGNED:  Bethena Roys, MD  [facility] 11/06/2016, 1:42 PM Pager [phone]  If 7PM-7AM, please contact night-coverage [email or web address] Password [email or web address]

## 2016-11-06 NOTE — Progress Notes (Signed)
Patient is to be discharged home and in stable condition. Patient and family present for discharge teaching and verbalize understanding. Patient escorted out via wheelchair by staff.  Celestia Khat, RN

## 2016-11-06 NOTE — Progress Notes (Signed)
[facility] for Warfarin Indication: atrial fibrillation  Allergies  Allergen Reactions  . Enalapril Maleate Swelling    Throat swelling  . Sulfa Antibiotics Other (See Comments)    Does not remember.   Summer Wong [Enalaprilat] Swelling    Tongue.    Patient Measurements: Height: 5\' 3"  (160 cm) Weight: 192 lb 14.4 oz (87.5 kg) IBW/kg (Calculated) : 52.4  Vital Signs: Temp: 98.5 F (36.9 C) (07/13 0609) Temp Source: Oral (07/13 0609) BP: 132/70 (07/13 0609) Pulse Rate: 92 (07/13 0609)  Labs:  Recent Labs  11/04/16 0527 11/05/16 0559 11/06/16 0615  HGB 10.4*  --   --   HCT 31.8*  --   --   PLT 194  --   --   LABPROT 36.7* 27.4* 23.3*  INR 3.59 2.49 2.03  CREATININE 1.61* 1.77* 1.98*   Estimated Creatinine Clearance: 20.6 mL/min (A) (by C-G formula based on SCr of 1.98 mg/dL (H)).  Medical History: Past Medical History:  Diagnosis Date  . Arthritis   . Asthma   . Atrial fibrillation ([facility])    Status post cardioversion in January 2016  . Baker's cyst   . Cancer ([facility])    Uterine  . CHF (congestive heart failure) ([facility])   . Chronic diastolic heart failure ([facility])   . COPD (chronic obstructive pulmonary disease) ([facility])   . Essential hypertension   . GERD (gastroesophageal reflux disease)   . Heart murmur   . History of hepatitis A   . Hyperlipemia   . Macular degeneration   . OSA on CPAP   . Pacemaker 2007   [employer] - sick sinus syndrome  . Renal insufficiency   . Sick sinus syndrome ([facility]) 2007  . Silent myocardial infarction ([facility]) 2006  . Stress incontinence   . TB lung, latent   . Type 2 diabetes mellitus ([facility])    Medications:  Prescriptions Prior to Admission  Medication Sig Dispense Refill Last Dose  . albuterol (PROVENTIL HFA;VENTOLIN HFA) 108 (90 BASE) MCG/ACT inhaler Inhale 1-2 puffs into the lungs every 6 (six) hours as needed for wheezing or shortness of breath.   Past Month at Unknown time  .  amLODipine (NORVASC) 10 MG tablet Take 10 mg by mouth every morning.    11/02/2016 at Unknown time  . aspirin EC 81 MG tablet Take 81 mg by mouth every morning.   11/02/2016 at Unknown time  . atorvastatin (LIPITOR) 40 MG tablet Take 40 mg by mouth at bedtime.   11/01/2016 at Unknown time  . carvedilol (COREG) 25 MG tablet Take 25 mg by mouth 2 (two) times daily with a meal.   11/02/2016 at 0700  . cetirizine (ZYRTEC) 10 MG tablet Take 10 mg by mouth every morning.    11/02/2016 at Unknown time  . cholecalciferol (VITAMIN D) 1000 UNITS tablet Take 1,000 Units by mouth every morning.    11/02/2016 at Unknown time  . fenofibrate (TRICOR) 145 MG tablet Take 145 mg by mouth every morning.   11/02/2016 at Unknown time  . ferrous sulfate 325 (65 FE) MG tablet Take 325 mg by mouth daily with breakfast.   11/02/2016 at Unknown time  . furosemide (LASIX) 20 MG tablet Take 1 tablet (20 mg total) by mouth daily. (Patient taking differently: Take 40-60 mg by mouth See admin instructions. Take 2 tab a day, and if patient gains weight, takes 1 more tab) 30 tablet 2 11/02/2016 at Unknown time  . insulin glargine (  LANTUS) 100 UNIT/ML injection Inject 43 Units into the skin at bedtime.   11/01/2016 at 1900  . Multiple Vitamins-Minerals (PRESERVISION AREDS) CAPS Take 1 capsule by mouth 2 (two) times daily.   Past Week at Unknown time  . nitroGLYCERIN (NITROSTAT) 0.4 MG SL tablet Place 0.4 mg under the tongue every 5 (five) minutes as needed for chest pain.   11/01/2016 at 1800  . Omega-3 Fatty Acids (FISH OIL) 1000 MG CAPS Take 1,000 mg by mouth daily.    11/02/2016 at Unknown time  . omeprazole (PRILOSEC) 20 MG capsule Take 20 mg by mouth every morning.    11/02/2016 at Unknown time  . oxybutynin (DITROPAN-XL) 5 MG 24 hr tablet Take 5 mg by mouth at bedtime.   11/01/2016 at Unknown time  . traMADol (ULTRAM) 50 MG tablet Take 1 tablet (50 mg total) by mouth every 6 (six) hours as needed for moderate pain. 30 tablet 0 Past Month at Unknown time   . warfarin (COUMADIN) 4 MG tablet Take 2-4 mg by mouth See admin instructions. Takes 1 tab everyday except Sundays; takes 2 mg (1/2 tab) on Sundays   11/01/2016 at 1900  . acetaminophen (TYLENOL) 500 MG tablet Take 1,000 mg by mouth every 6 (six) hours as needed for moderate pain or headache.   10/31/2016   Assessment: Okay for Protocol,  INR now at goal.    Goal of Therapy:  INR 2-3   Plan:  Coumadin 4mg  today x 1 dose Daily PT / INR.  Monitor for signs and symptoms of bleeding.    [name] 11/06/2016,9:17 AM

## 2016-11-09 ENCOUNTER — Other Ambulatory Visit (HOSPITAL_COMMUNITY)
Admission: RE | Admit: 2016-11-09 | Discharge: 2016-11-09 | Disposition: A | Discharge status: Home / Self Care | Payer: Medicare Other | Source: Other Acute Inpatient Hospital | Attending: Internal Medicine | Admitting: Internal Medicine

## 2016-11-09 DIAGNOSIS — N183 Chronic kidney disease, stage 3 (moderate): Secondary | ICD-10-CM | POA: Diagnosis present

## 2016-11-09 DIAGNOSIS — N184 Chronic kidney disease, stage 4 (severe): Secondary | ICD-10-CM | POA: Insufficient documentation

## 2016-11-09 LAB — BASIC METABOLIC PANEL
Anion gap: 10 (ref 5–15)
BUN: 38 mg/dL — AB (ref 6–20)
CALCIUM: 9 mg/dL (ref 8.9–10.3)
CHLORIDE: 98 mmol/L — AB (ref 101–111)
CO2: 29 mmol/L (ref 22–32)
Creatinine, Ser: 1.61 mg/dL — ABNORMAL HIGH (ref 0.44–1.00)
GFR calc non Af Amer: 27 mL/min — ABNORMAL LOW (ref >60)
GFR, EST AFRICAN AMERICAN: 32 mL/min — AB (ref >60)
Glucose, Bld: 128 mg/dL — ABNORMAL HIGH (ref 65–99)
Potassium: 4.1 mmol/L (ref 3.5–5.1)
SODIUM: 137 mmol/L (ref 135–145)

## 2017-07-15 ENCOUNTER — Ambulatory Visit (HOSPITAL_COMMUNITY)
Admission: RE | Admit: 2017-07-15 | Discharge: 2017-07-15 | Disposition: A | Discharge status: Home / Self Care | Payer: Medicare HMO | Source: Ambulatory Visit | Attending: Emergency Medicine | Admitting: Emergency Medicine

## 2017-07-15 ENCOUNTER — Other Ambulatory Visit (HOSPITAL_COMMUNITY): Payer: Self-pay | Admitting: Emergency Medicine

## 2017-07-15 DIAGNOSIS — D689 Coagulation defect, unspecified: Secondary | ICD-10-CM | POA: Diagnosis present

## 2017-07-15 DIAGNOSIS — R55 Syncope and collapse: Secondary | ICD-10-CM

## 2017-07-15 DIAGNOSIS — G319 Degenerative disease of nervous system, unspecified: Secondary | ICD-10-CM | POA: Insufficient documentation

## 2017-07-16 ENCOUNTER — Other Ambulatory Visit (HOSPITAL_COMMUNITY): Payer: Self-pay | Admitting: Emergency Medicine

## 2017-07-16 DIAGNOSIS — I6523 Occlusion and stenosis of bilateral carotid arteries: Secondary | ICD-10-CM

## 2017-07-22 ENCOUNTER — Ambulatory Visit (HOSPITAL_COMMUNITY): Payer: Medicare HMO

## 2017-08-09 ENCOUNTER — Ambulatory Visit (HOSPITAL_COMMUNITY)
Admission: RE | Admit: 2017-08-09 | Discharge: 2017-08-09 | Disposition: A | Discharge status: Home / Self Care | Payer: Medicare HMO | Source: Ambulatory Visit | Attending: Emergency Medicine | Admitting: Emergency Medicine

## 2017-08-09 DIAGNOSIS — I6523 Occlusion and stenosis of bilateral carotid arteries: Secondary | ICD-10-CM | POA: Insufficient documentation

## 2017-09-29 ENCOUNTER — Other Ambulatory Visit (HOSPITAL_BASED_OUTPATIENT_CLINIC_OR_DEPARTMENT_OTHER): Payer: Self-pay

## 2017-09-29 DIAGNOSIS — G4710 Hypersomnia, unspecified: Secondary | ICD-10-CM

## 2017-09-29 DIAGNOSIS — R5383 Other fatigue: Secondary | ICD-10-CM

## 2017-09-29 DIAGNOSIS — G4730 Sleep apnea, unspecified: Secondary | ICD-10-CM

## 2017-09-29 DIAGNOSIS — R0683 Snoring: Secondary | ICD-10-CM

## 2017-10-08 ENCOUNTER — Encounter (INDEPENDENT_AMBULATORY_CARE_PROVIDER_SITE_OTHER): Payer: Self-pay

## 2017-10-08 ENCOUNTER — Ambulatory Visit: Payer: Medicare HMO | Attending: Emergency Medicine | Admitting: Neurology

## 2017-10-08 DIAGNOSIS — G4730 Sleep apnea, unspecified: Secondary | ICD-10-CM

## 2017-10-08 DIAGNOSIS — Z7982 Long term (current) use of aspirin: Secondary | ICD-10-CM | POA: Diagnosis not present

## 2017-10-08 DIAGNOSIS — Z7901 Long term (current) use of anticoagulants: Secondary | ICD-10-CM | POA: Diagnosis not present

## 2017-10-08 DIAGNOSIS — R0683 Snoring: Secondary | ICD-10-CM

## 2017-10-08 DIAGNOSIS — G4733 Obstructive sleep apnea (adult) (pediatric): Secondary | ICD-10-CM | POA: Insufficient documentation

## 2017-10-08 DIAGNOSIS — Z794 Long term (current) use of insulin: Secondary | ICD-10-CM | POA: Diagnosis not present

## 2017-10-08 DIAGNOSIS — R5383 Other fatigue: Secondary | ICD-10-CM

## 2017-10-08 DIAGNOSIS — Z79899 Other long term (current) drug therapy: Secondary | ICD-10-CM | POA: Diagnosis not present

## 2017-10-08 DIAGNOSIS — G4710 Hypersomnia, unspecified: Secondary | ICD-10-CM | POA: Diagnosis not present

## 2017-10-14 NOTE — Procedures (Signed)
Glorieta A. Merlene Laughter, MD     [email or web address]             NOCTURNAL POLYSOMNOGRAPHY   LOCATION: [facility]   Patient Name: Summer Wong, Summer Wong Date: 10/08/2017 Gender: Female D.O.B: 09/17/1925 Age (years): 89 Referring Provider: Vidal Schwalbe Height (inches): 62 Interpreting Physician: Phillips Odor MD, ABSM Weight (lbs): 192 RPSGT: Peak, Robert BMI: 35 MRN: [id number] Neck Size: 15.50 <br> <br> CLINICAL INFORMATION Sleep Study Type: Split Night CPAP    Indication for sleep study: Excessive Daytime Sleepiness, Fatigue, Snoring    Epworth Sleepiness Score: 6  SLEEP STUDY TECHNIQUE As per the AASM Manual for the Scoring of Sleep and Associated Events v2.3 (April 2016) with a hypopnea requiring 4% desaturations.  The channels recorded and monitored were frontal, central and occipital EEG, electrooculogram (EOG), submentalis EMG (chin), nasal and oral airflow, thoracic and abdominal wall motion, anterior tibialis EMG, snore microphone, electrocardiogram, and pulse oximetry. Continuous positive airway pressure (CPAP) was initiated when the patient met split night criteria and was titrated according to treat sleep-disordered breathing.  MEDICATIONS Medications self-administered by patient taken the night of the study : N/A  Current Outpatient Medications:  .  acetaminophen (TYLENOL) 500 MG tablet, Take 1,000 mg by mouth every 6 (six) hours as needed for moderate pain or headache., Disp: , Rfl:  .  albuterol (PROVENTIL HFA;VENTOLIN HFA) 108 (90 BASE) MCG/ACT inhaler, Inhale 1-2 puffs into the lungs every 6 (six) hours as needed for wheezing or shortness of breath., Disp: , Rfl:  .  amLODipine (NORVASC) 10 MG tablet, Take 10 mg by mouth every morning. , Disp: , Rfl:  .  aspirin EC 81 MG tablet, Take 81 mg by mouth every morning., Disp: , Rfl:  .  atorvastatin (LIPITOR) 40 MG tablet, Take 40 mg by mouth at bedtime., Disp: , Rfl:  .  carvedilol  (COREG) 25 MG tablet, Take 25 mg by mouth 2 (two) times daily with a meal., Disp: , Rfl:  .  cetirizine (ZYRTEC) 10 MG tablet, Take 10 mg by mouth every morning. , Disp: , Rfl:  .  cholecalciferol (VITAMIN D) 1000 UNITS tablet, Take 1,000 Units by mouth every morning. , Disp: , Rfl:  .  ferrous sulfate 325 (65 FE) MG tablet, Take 325 mg by mouth daily with breakfast., Disp: , Rfl:  .  insulin glargine (LANTUS) 100 UNIT/ML injection, Inject 43 Units into the skin at bedtime., Disp: , Rfl:  .  isosorbide mononitrate (IMDUR) 30 MG 24 hr tablet, Take 0.5 tablets (15 mg total) by mouth daily., Disp: 15 tablet, Rfl: 0 .  Multiple Vitamins-Minerals (PRESERVISION AREDS) CAPS, Take 1 capsule by mouth 2 (two) times daily., Disp: , Rfl:  .  nitroGLYCERIN (NITROSTAT) 0.4 MG SL tablet, Place 0.4 mg under the tongue every 5 (five) minutes as needed for chest pain., Disp: , Rfl:  .  Omega-3 Fatty Acids (FISH OIL) 1000 MG CAPS, Take 1,000 mg by mouth daily. , Disp: , Rfl:  .  omeprazole (PRILOSEC) 20 MG capsule, Take 20 mg by mouth every morning. , Disp: , Rfl:  .  oxybutynin (DITROPAN-XL) 5 MG 24 hr tablet, Take 5 mg by mouth at bedtime., Disp: , Rfl:  .  traMADol (ULTRAM) 50 MG tablet, Take 1 tablet (50 mg total) by mouth every 6 (six) hours as needed for moderate pain., Disp: 30 tablet, Rfl: 0 .  warfarin (COUMADIN) 2 MG tablet, Take 1 tablet (2 mg total) by mouth daily., Disp: ,  Rfl:    RESPIRATORY PARAMETERS Diagnostic  Total AHI (/hr): 51.5 RDI (/hr): 84.1 OA Index (/hr): - CA Index (/hr): 0.0 REM AHI (/hr): N/A NREM AHI (/hr): 51.5 Supine AHI (/hr): N/A Non-supine AHI (/hr): 51.55 Min O2 Sat (%): 88.0 Mean O2 (%): 93.2 Time below 88% (min): 0.1   Titration  Optimal Pressure (cm): 12 AHI at Optimal Pressure (/hr): 6.1 Min O2 at Optimal Pressure (%): 90.0 Supine % at Optimal (%): 0 Sleep % at Optimal (%): 92   SLEEP ARCHITECTURE The recording time for the entire night was 434.2 minutes.  During a  baseline period of 222.1 minutes, the patient slept for 145.5 minutes in REM and nonREM, yielding a sleep efficiency of 65.5%%. Sleep onset after lights out was 11.6 minutes with a REM latency of N/A minutes. The patient spent 24.1%% of the night in stage N1 sleep, 75.9%% in stage N2 sleep, 0.0%% in stage N3 and 0.0%% in REM.    During the titration period of 209.6 minutes, the patient slept for 189.6 minutes in REM and nonREM, yielding a sleep efficiency of 90.5%%. Sleep onset after CPAP initiation was 3.9 minutes with a REM latency of 13.5 minutes. The patient spent 11.7%% of the night in stage N1 sleep, 57.5%% in stage N2 sleep, 0.0%% in stage N3 and 30.8%% in REM.  CARDIAC DATA The 2 lead EKG demonstrated atrial fibrillation. The mean heart rate was 100.0 beats per minute. Other EKG findings include: PVCs. LEG MOVEMENT DATA The total Periodic Limb Movements of Sleep (PLMS) were 0. The PLMS index was 0.0.  IMPRESSIONS - Severe obstructive sleep apnea occurred during the diagnostic portion of the study (AHI = 51.5/hour). The optimal CPAP selected for this patient is ( 12 cm of water) - Absent slow wave sleep is observed during both the diagnostic and titration portions of the study.  Delano Metz, MD Diplomate, American Board of Sleep Medicine.  ELECTRONICALLY SIGNED ON:  10/14/2017, 9:58 AM [place] PH: [phone]   FX: [phone] [place]

## 2018-02-08 ENCOUNTER — Emergency Department (HOSPITAL_COMMUNITY): Payer: Medicare HMO

## 2018-02-08 ENCOUNTER — Emergency Department (HOSPITAL_COMMUNITY)
Admission: EM | Admit: 2018-02-08 | Discharge: 2018-02-08 | Disposition: A | Discharge status: Home / Self Care | Payer: Medicare HMO | Attending: Emergency Medicine | Admitting: Emergency Medicine

## 2018-02-08 ENCOUNTER — Encounter (HOSPITAL_COMMUNITY): Payer: Self-pay | Admitting: Emergency Medicine

## 2018-02-08 ENCOUNTER — Other Ambulatory Visit: Payer: Self-pay

## 2018-02-08 DIAGNOSIS — I130 Hypertensive heart and chronic kidney disease with heart failure and stage 1 through stage 4 chronic kidney disease, or unspecified chronic kidney disease: Secondary | ICD-10-CM | POA: Diagnosis not present

## 2018-02-08 DIAGNOSIS — E1165 Type 2 diabetes mellitus with hyperglycemia: Secondary | ICD-10-CM | POA: Insufficient documentation

## 2018-02-08 DIAGNOSIS — I2510 Atherosclerotic heart disease of native coronary artery without angina pectoris: Secondary | ICD-10-CM | POA: Insufficient documentation

## 2018-02-08 DIAGNOSIS — J810 Acute pulmonary edema: Secondary | ICD-10-CM | POA: Diagnosis not present

## 2018-02-08 DIAGNOSIS — I5033 Acute on chronic diastolic (congestive) heart failure: Secondary | ICD-10-CM | POA: Insufficient documentation

## 2018-02-08 DIAGNOSIS — N183 Chronic kidney disease, stage 3 (moderate): Secondary | ICD-10-CM | POA: Insufficient documentation

## 2018-02-08 DIAGNOSIS — Z794 Long term (current) use of insulin: Secondary | ICD-10-CM | POA: Diagnosis not present

## 2018-02-08 DIAGNOSIS — Z950 Presence of cardiac pacemaker: Secondary | ICD-10-CM | POA: Insufficient documentation

## 2018-02-08 DIAGNOSIS — E1122 Type 2 diabetes mellitus with diabetic chronic kidney disease: Secondary | ICD-10-CM | POA: Diagnosis not present

## 2018-02-08 DIAGNOSIS — R0602 Shortness of breath: Secondary | ICD-10-CM | POA: Diagnosis present

## 2018-02-08 DIAGNOSIS — Z79899 Other long term (current) drug therapy: Secondary | ICD-10-CM | POA: Diagnosis not present

## 2018-02-08 DIAGNOSIS — J45909 Unspecified asthma, uncomplicated: Secondary | ICD-10-CM | POA: Insufficient documentation

## 2018-02-08 LAB — CBC WITH DIFFERENTIAL/PLATELET
Abs Immature Granulocytes: 0.02 10*3/uL (ref 0.00–0.07)
BASOS ABS: 0 10*3/uL (ref 0.0–0.1)
BASOS PCT: 1 %
Eosinophils Absolute: 0.2 10*3/uL (ref 0.0–0.5)
Eosinophils Relative: 3 %
HCT: 30 % — ABNORMAL LOW (ref 36.0–46.0)
HEMOGLOBIN: 9.1 g/dL — AB (ref 12.0–15.0)
Immature Granulocytes: 0 %
LYMPHS PCT: 16 %
Lymphs Abs: 1.3 10*3/uL (ref 0.7–4.0)
MCH: 26.3 pg (ref 26.0–34.0)
MCHC: 30.3 g/dL (ref 30.0–36.0)
MCV: 86.7 fL (ref 80.0–100.0)
Monocytes Absolute: 0.6 10*3/uL (ref 0.1–1.0)
Monocytes Relative: 7 %
NRBC: 0 % (ref 0.0–0.2)
Neutro Abs: 5.8 10*3/uL (ref 1.7–7.7)
Neutrophils Relative %: 73 %
PLATELETS: 242 10*3/uL (ref 150–400)
RBC: 3.46 MIL/uL — AB (ref 3.87–5.11)
RDW: 16 % — AB (ref 11.5–15.5)
WBC: 7.9 10*3/uL (ref 4.0–10.5)

## 2018-02-08 LAB — COMPREHENSIVE METABOLIC PANEL
ALBUMIN: 3.7 g/dL (ref 3.5–5.0)
ALK PHOS: 112 U/L (ref 38–126)
ALT: 12 U/L (ref 0–44)
ANION GAP: 10 (ref 5–15)
AST: 27 U/L (ref 15–41)
BUN: 66 mg/dL — ABNORMAL HIGH (ref 8–23)
CHLORIDE: 105 mmol/L (ref 98–111)
CO2: 19 mmol/L — AB (ref 22–32)
Calcium: 8.9 mg/dL (ref 8.9–10.3)
Creatinine, Ser: 2.37 mg/dL — ABNORMAL HIGH (ref 0.44–1.00)
GFR calc Af Amer: 20 mL/min — ABNORMAL LOW (ref >60)
GFR calc non Af Amer: 17 mL/min — ABNORMAL LOW (ref >60)
Glucose, Bld: 123 mg/dL — ABNORMAL HIGH (ref 70–99)
POTASSIUM: 4.5 mmol/L (ref 3.5–5.1)
SODIUM: 134 mmol/L — AB (ref 135–145)
Total Bilirubin: 0.7 mg/dL (ref 0.3–1.2)
Total Protein: 7.1 g/dL (ref 6.5–8.1)

## 2018-02-08 LAB — CBG MONITORING, ED: GLUCOSE-CAPILLARY: 120 mg/dL — AB (ref 70–99)

## 2018-02-08 LAB — TROPONIN I
TROPONIN I: 0.03 ng/mL — AB (ref <0.03)
Troponin I: 0.03 ng/mL (ref <0.03)

## 2018-02-08 MED ORDER — FUROSEMIDE 40 MG PO TABS: 40.0000 mg | ORAL_TABLET | Freq: Every day | ORAL | 1 refills | Status: DC

## 2018-02-08 MED ORDER — FUROSEMIDE 10 MG/ML IJ SOLN
40.0000 mg | Freq: Once | INTRAMUSCULAR | Status: AC
  Administered 2018-02-08: 40 mg via INTRAVENOUS
  Filled 2018-02-08: qty 4

## 2018-02-08 NOTE — ED Notes (Signed)
Bilateral lower extremity edema, 2 pound weight gain this week

## 2018-02-08 NOTE — ED Triage Notes (Signed)
Complaints on sob x1 a few day, with non productive cough

## 2018-02-08 NOTE — ED Notes (Signed)
Date and time results received: 02/08/18 1521 (use smartphrase ".now" to insert current time)  Test: troponin Critical Value: 0.03  Name of Provider Notified: cook MD  Orders Received? Or Actions Taken?: n/a

## 2018-02-08 NOTE — ED Notes (Signed)
Pt assisted to bedside commode, lab waiting to do next lab test

## 2018-02-08 NOTE — ED Triage Notes (Signed)
Per son, Pt got up not feeling well, diaphoretic, pt ate then BS was 88. Weakness per son, EDP at the bedside.

## 2018-02-08 NOTE — ED Notes (Signed)
CRITICAL VALUE ALERT  Critical Value:  Tropin 0.03  Date & Time Notied:  02/08/18  Provider Notified: Dr Lacinda Axon

## 2018-02-08 NOTE — Discharge Instructions (Addendum)
Tests show a small amount of fluid in your lungs.  You are currently on a fluid medication called Lasix [furosemide] 20 mg daily.  I am recommending you double the dose to 40 mg daily.  New prescription given.  Follow-up with your primary care doctor this week or return if worse.

## 2018-02-08 NOTE — ED Notes (Signed)
Patient given discharge instruction, verbalized understand. IV removed, band aid applied. Patient ambulatory out of the department.  

## 2018-02-10 ENCOUNTER — Other Ambulatory Visit: Payer: Self-pay

## 2018-02-10 ENCOUNTER — Encounter (HOSPITAL_COMMUNITY): Payer: Self-pay | Admitting: Emergency Medicine

## 2018-02-10 ENCOUNTER — Emergency Department (HOSPITAL_COMMUNITY): Payer: Medicare HMO

## 2018-02-10 ENCOUNTER — Observation Stay (HOSPITAL_COMMUNITY)
Admission: EM | Admit: 2018-02-10 | Discharge: 2018-02-11 | Disposition: A | Discharge status: Home / Self Care | Payer: Medicare HMO | Attending: Internal Medicine | Admitting: Internal Medicine

## 2018-02-10 DIAGNOSIS — Z23 Encounter for immunization: Secondary | ICD-10-CM | POA: Diagnosis not present

## 2018-02-10 DIAGNOSIS — R0902 Hypoxemia: Secondary | ICD-10-CM

## 2018-02-10 DIAGNOSIS — E1122 Type 2 diabetes mellitus with diabetic chronic kidney disease: Secondary | ICD-10-CM | POA: Insufficient documentation

## 2018-02-10 DIAGNOSIS — G4733 Obstructive sleep apnea (adult) (pediatric): Secondary | ICD-10-CM | POA: Insufficient documentation

## 2018-02-10 DIAGNOSIS — Z79899 Other long term (current) drug therapy: Secondary | ICD-10-CM | POA: Insufficient documentation

## 2018-02-10 DIAGNOSIS — I130 Hypertensive heart and chronic kidney disease with heart failure and stage 1 through stage 4 chronic kidney disease, or unspecified chronic kidney disease: Secondary | ICD-10-CM | POA: Diagnosis not present

## 2018-02-10 DIAGNOSIS — Z7982 Long term (current) use of aspirin: Secondary | ICD-10-CM | POA: Insufficient documentation

## 2018-02-10 DIAGNOSIS — I2510 Atherosclerotic heart disease of native coronary artery without angina pectoris: Secondary | ICD-10-CM | POA: Insufficient documentation

## 2018-02-10 DIAGNOSIS — Z950 Presence of cardiac pacemaker: Secondary | ICD-10-CM | POA: Diagnosis not present

## 2018-02-10 DIAGNOSIS — N183 Chronic kidney disease, stage 3 (moderate): Secondary | ICD-10-CM | POA: Insufficient documentation

## 2018-02-10 DIAGNOSIS — I480 Paroxysmal atrial fibrillation: Secondary | ICD-10-CM | POA: Diagnosis not present

## 2018-02-10 DIAGNOSIS — E1165 Type 2 diabetes mellitus with hyperglycemia: Secondary | ICD-10-CM | POA: Insufficient documentation

## 2018-02-10 DIAGNOSIS — I4891 Unspecified atrial fibrillation: Secondary | ICD-10-CM | POA: Diagnosis not present

## 2018-02-10 DIAGNOSIS — Z794 Long term (current) use of insulin: Secondary | ICD-10-CM | POA: Insufficient documentation

## 2018-02-10 DIAGNOSIS — J45909 Unspecified asthma, uncomplicated: Secondary | ICD-10-CM | POA: Insufficient documentation

## 2018-02-10 DIAGNOSIS — R0602 Shortness of breath: Secondary | ICD-10-CM

## 2018-02-10 DIAGNOSIS — I5033 Acute on chronic diastolic (congestive) heart failure: Secondary | ICD-10-CM | POA: Diagnosis not present

## 2018-02-10 DIAGNOSIS — I10 Essential (primary) hypertension: Secondary | ICD-10-CM | POA: Diagnosis present

## 2018-02-10 DIAGNOSIS — Z9989 Dependence on other enabling machines and devices: Secondary | ICD-10-CM

## 2018-02-10 DIAGNOSIS — I509 Heart failure, unspecified: Secondary | ICD-10-CM

## 2018-02-10 LAB — CBC WITH DIFFERENTIAL/PLATELET
ABS IMMATURE GRANULOCYTES: 0.02 10*3/uL (ref 0.00–0.07)
Basophils Absolute: 0 10*3/uL (ref 0.0–0.1)
Basophils Relative: 0 %
EOS ABS: 0.2 10*3/uL (ref 0.0–0.5)
Eosinophils Relative: 3 %
HEMATOCRIT: 31 % — AB (ref 36.0–46.0)
Hemoglobin: 9.6 g/dL — ABNORMAL LOW (ref 12.0–15.0)
Immature Granulocytes: 0 %
LYMPHS ABS: 1.6 10*3/uL (ref 0.7–4.0)
Lymphocytes Relative: 21 %
MCH: 25.8 pg — AB (ref 26.0–34.0)
MCHC: 31 g/dL (ref 30.0–36.0)
MCV: 83.3 fL (ref 80.0–100.0)
MONO ABS: 0.5 10*3/uL (ref 0.1–1.0)
MONOS PCT: 7 %
NEUTROS ABS: 5.2 10*3/uL (ref 1.7–7.7)
Neutrophils Relative %: 69 %
Platelets: 250 10*3/uL (ref 150–400)
RBC: 3.72 MIL/uL — ABNORMAL LOW (ref 3.87–5.11)
RDW: 16 % — ABNORMAL HIGH (ref 11.5–15.5)
WBC: 7.6 10*3/uL (ref 4.0–10.5)
nRBC: 0 % (ref 0.0–0.2)

## 2018-02-10 LAB — URINALYSIS, ROUTINE W REFLEX MICROSCOPIC
BILIRUBIN URINE: NEGATIVE
GLUCOSE, UA: NEGATIVE mg/dL
KETONES UR: NEGATIVE mg/dL
Nitrite: NEGATIVE
PROTEIN: NEGATIVE mg/dL
Specific Gravity, Urine: 1.006 (ref 1.005–1.030)
pH: 5 (ref 5.0–8.0)

## 2018-02-10 LAB — PROTIME-INR
INR: 1.54
Prothrombin Time: 18.3 seconds — ABNORMAL HIGH (ref 11.4–15.2)

## 2018-02-10 LAB — BASIC METABOLIC PANEL
Anion gap: 13 (ref 5–15)
BUN: 67 mg/dL — AB (ref 8–23)
CALCIUM: 9.2 mg/dL (ref 8.9–10.3)
CHLORIDE: 101 mmol/L (ref 98–111)
CO2: 19 mmol/L — ABNORMAL LOW (ref 22–32)
CREATININE: 2.26 mg/dL — AB (ref 0.44–1.00)
GFR calc non Af Amer: 18 mL/min — ABNORMAL LOW (ref >60)
GFR, EST AFRICAN AMERICAN: 21 mL/min — AB (ref >60)
Glucose, Bld: 130 mg/dL — ABNORMAL HIGH (ref 70–99)
Potassium: 4.9 mmol/L (ref 3.5–5.1)
SODIUM: 133 mmol/L — AB (ref 135–145)

## 2018-02-10 LAB — BRAIN NATRIURETIC PEPTIDE: B NATRIURETIC PEPTIDE 5: 307 pg/mL — AB (ref 0.0–100.0)

## 2018-02-10 LAB — TROPONIN I

## 2018-02-10 MED ORDER — OXYBUTYNIN CHLORIDE ER 5 MG PO TB24
5.0000 mg | ORAL_TABLET | Freq: Two times a day (BID) | ORAL | Status: DC
  Administered 2018-02-10 – 2018-02-11 (×2): 5 mg via ORAL
  Filled 2018-02-10 (×2): qty 1

## 2018-02-10 MED ORDER — INSULIN ASPART 100 UNIT/ML ~~LOC~~ SOLN: 0.0000 [IU] | Freq: Three times a day (TID) | SUBCUTANEOUS | Status: DC

## 2018-02-10 MED ORDER — SODIUM CHLORIDE 0.9% FLUSH: 3.0000 mL | INTRAVENOUS | Status: DC | PRN

## 2018-02-10 MED ORDER — BUDESONIDE 0.25 MG/2ML IN SUSP
0.2500 mg | Freq: Two times a day (BID) | RESPIRATORY_TRACT | Status: DC
  Administered 2018-02-10 – 2018-02-11 (×2): 0.25 mg via RESPIRATORY_TRACT
  Filled 2018-02-10 (×4): qty 2

## 2018-02-10 MED ORDER — APIXABAN 2.5 MG PO TABS
2.5000 mg | ORAL_TABLET | Freq: Two times a day (BID) | ORAL | Status: DC
  Filled 2018-02-10 (×3): qty 1

## 2018-02-10 MED ORDER — LORATADINE 10 MG PO TABS
10.0000 mg | ORAL_TABLET | Freq: Every day | ORAL | Status: DC
  Administered 2018-02-11: 10 mg via ORAL
  Filled 2018-02-10: qty 1

## 2018-02-10 MED ORDER — INSULIN GLARGINE 100 UNIT/ML ~~LOC~~ SOLN
25.0000 [IU] | Freq: Every day | SUBCUTANEOUS | Status: DC
  Administered 2018-02-10: 25 [IU] via SUBCUTANEOUS
  Filled 2018-02-10 (×4): qty 0.25

## 2018-02-10 MED ORDER — ISOSORBIDE MONONITRATE ER 30 MG PO TB24
15.0000 mg | ORAL_TABLET | Freq: Every day | ORAL | Status: DC
  Administered 2018-02-11: 15 mg via ORAL
  Filled 2018-02-10: qty 1

## 2018-02-10 MED ORDER — PANTOPRAZOLE SODIUM 40 MG PO TBEC
40.0000 mg | DELAYED_RELEASE_TABLET | Freq: Every day | ORAL | Status: DC
  Administered 2018-02-11: 40 mg via ORAL
  Filled 2018-02-10: qty 1

## 2018-02-10 MED ORDER — CARVEDILOL 12.5 MG PO TABS
25.0000 mg | ORAL_TABLET | Freq: Two times a day (BID) | ORAL | Status: DC
  Administered 2018-02-10 – 2018-02-11 (×2): 25 mg via ORAL
  Filled 2018-02-10 (×2): qty 2

## 2018-02-10 MED ORDER — IPRATROPIUM-ALBUTEROL 0.5-2.5 (3) MG/3ML IN SOLN
3.0000 mL | Freq: Once | RESPIRATORY_TRACT | Status: AC
  Administered 2018-02-10: 3 mL via RESPIRATORY_TRACT
  Filled 2018-02-10: qty 3

## 2018-02-10 MED ORDER — VITAMIN D 1000 UNITS PO TABS
1000.0000 [IU] | ORAL_TABLET | Freq: Every morning | ORAL | Status: DC
  Administered 2018-02-11: 1000 [IU] via ORAL
  Filled 2018-02-10: qty 1

## 2018-02-10 MED ORDER — ASPIRIN EC 81 MG PO TBEC
81.0000 mg | DELAYED_RELEASE_TABLET | Freq: Every morning | ORAL | Status: DC
  Administered 2018-02-11: 81 mg via ORAL
  Filled 2018-02-10: qty 1

## 2018-02-10 MED ORDER — APIXABAN 2.5 MG PO TABS
2.5000 mg | ORAL_TABLET | Freq: Two times a day (BID) | ORAL | Status: DC
  Administered 2018-02-10 – 2018-02-11 (×2): 2.5 mg via ORAL
  Filled 2018-02-10 (×5): qty 1

## 2018-02-10 MED ORDER — LEVALBUTEROL HCL 0.63 MG/3ML IN NEBU: 0.6300 mg | INHALATION_SOLUTION | Freq: Three times a day (TID) | RESPIRATORY_TRACT | Status: DC | PRN

## 2018-02-10 MED ORDER — IPRATROPIUM BROMIDE 0.02 % IN SOLN
0.5000 mg | Freq: Three times a day (TID) | RESPIRATORY_TRACT | Status: DC
  Administered 2018-02-10: 0.5 mg via RESPIRATORY_TRACT
  Filled 2018-02-10: qty 2.5

## 2018-02-10 MED ORDER — FUROSEMIDE 40 MG PO TABS
40.0000 mg | ORAL_TABLET | Freq: Two times a day (BID) | ORAL | Status: DC
  Administered 2018-02-10 – 2018-02-11 (×2): 40 mg via ORAL
  Filled 2018-02-10 (×2): qty 1

## 2018-02-10 MED ORDER — ONDANSETRON HCL 4 MG/2ML IJ SOLN: 4.0000 mg | Freq: Four times a day (QID) | INTRAMUSCULAR | Status: DC | PRN

## 2018-02-10 MED ORDER — ACETAMINOPHEN 325 MG PO TABS: 650.0000 mg | ORAL_TABLET | ORAL | Status: DC | PRN

## 2018-02-10 MED ORDER — SODIUM CHLORIDE 0.9% FLUSH
3.0000 mL | Freq: Two times a day (BID) | INTRAVENOUS | Status: DC
  Administered 2018-02-11 (×2): 3 mL via INTRAVENOUS

## 2018-02-10 MED ORDER — ATORVASTATIN CALCIUM 40 MG PO TABS
40.0000 mg | ORAL_TABLET | Freq: Every day | ORAL | Status: DC
  Administered 2018-02-10: 40 mg via ORAL
  Filled 2018-02-10: qty 1

## 2018-02-10 MED ORDER — SODIUM CHLORIDE 0.9 % IV SOLN: 250.0000 mL | INTRAVENOUS | Status: DC | PRN

## 2018-02-10 MED ORDER — FUROSEMIDE 10 MG/ML IJ SOLN
40.0000 mg | Freq: Once | INTRAMUSCULAR | Status: AC
  Administered 2018-02-10: 40 mg via INTRAVENOUS
  Filled 2018-02-10: qty 4

## 2018-02-10 MED ORDER — ALBUTEROL SULFATE (2.5 MG/3ML) 0.083% IN NEBU
2.5000 mg | INHALATION_SOLUTION | Freq: Once | RESPIRATORY_TRACT | Status: AC
  Administered 2018-02-10: 2.5 mg via RESPIRATORY_TRACT
  Filled 2018-02-10: qty 3

## 2018-02-10 MED ORDER — INFLUENZA VAC SPLIT HIGH-DOSE 0.5 ML IM SUSY
0.5000 mL | PREFILLED_SYRINGE | INTRAMUSCULAR | Status: AC
  Administered 2018-02-11: 0.5 mL via INTRAMUSCULAR
  Filled 2018-02-10: qty 0.5

## 2018-02-10 MED ORDER — FERROUS SULFATE 325 (65 FE) MG PO TABS
325.0000 mg | ORAL_TABLET | Freq: Every day | ORAL | Status: DC
  Administered 2018-02-11: 325 mg via ORAL
  Filled 2018-02-10: qty 1

## 2018-02-10 NOTE — ED Notes (Signed)
ED Provider at bedside. 

## 2018-02-10 NOTE — ED Notes (Signed)
Patient could only walk a short distance before she became very short of breath o2 sat dropped to 90% in just the short distance she walked.

## 2018-02-10 NOTE — H&P (Signed)
History and Physical    Summer Wong KGM:[id number] DOB: Jun 13, 1925 DOA: 02/10/2018  Referring MD/NP/PA: Dr. Thurnell Garbe PCP: [facility]  Patient coming from: home  Chief Complaint: SOB  HPI: Summer Wong is a 84 y.o. female with PMH of OSA, diastolic HF, atrial fibrillation, HLD, type 2 diabetes and CKD (stage 3-4); presented to ED with worsening SOB. Patient was seen 2 days ago in ED and at that time started on higher dose of lasix; w/o much improvement in her resp discomfort. Patient denies CP, fever, nausea, vomiting, abd pain, dysuria, hematuria or any other complaints. In ED BNP elevated, difficulty speaking in full sentences and requiring oxygen supplementation. Cr 2.26, normal K. IV lasix X1 given, albuterol nebulizer treatment provided. TRH called to place patient in observation for further evaluation and treatment.   Past Medical/Surgical History: Past Medical History:  Diagnosis Date  . Arthritis   . Asthma   . Atrial fibrillation ([place])    Status post cardioversion in January 2016  . Baker's cyst   . Cancer ([place])    Uterine  . CHF (congestive heart failure) ([place])   . Chronic diastolic heart failure ([place])   . COPD (chronic obstructive pulmonary disease) ([place])   . Essential hypertension   . GERD (gastroesophageal reflux disease)   . Heart murmur   . History of hepatitis A   . Hyperlipemia   . Macular degeneration   . OSA on CPAP   . Pacemaker 2007   [employer] - sick sinus syndrome  . Renal insufficiency   . Sick sinus syndrome ([place]) 2007  . Silent myocardial infarction ([place]) 2006  . Stress incontinence   . TB lung, latent   . Type 2 diabetes mellitus ([place])     Past Surgical History:  Procedure Laterality Date  . BLADDER SUSPENSION    . CHOLECYSTECTOMY    . PACEMAKER INSERTION    . ROBOTIC ASSISTED TOTAL HYSTERECTOMY WITH BILATERAL SALPINGO OOPHERECTOMY Bilateral 08/16/2014   Procedure:  ROBOTIC ASSISTED TOTAL HYSTERECTOMY WITH  BILATERAL SALPINGO OOPHORECTOMY AND LYMPHADENECTOMY;  Surgeon: Everitt Amber, MD;  Location: [facility];  Service: Gynecology;  Laterality: Bilateral;  . TUBAL LIGATION      Social History:  reports that she has never smoked. She has never used smokeless tobacco. She reports that she does not drink alcohol or use drugs.  Allergies: Allergies  Allergen Reactions  . Enalapril Maleate Swelling    Throat swelling  . Sulfa Antibiotics Other (See Comments)    Does not remember.   Michail Sermon [Enalaprilat] Swelling    Tongue.     Family History:  Family History  Problem Relation Age of Onset  . Stomach cancer Father   . Colon cancer Brother     Prior to Admission medications   Medication Sig Start Date End Date Taking? Authorizing Provider  amLODipine (NORVASC) 10 MG tablet Take 10 mg by mouth every morning.    Yes [provider]  apixaban (ELIQUIS) 2.5 MG TABS tablet Take 2.5 mg by mouth 2 (two) times daily.  11/23/17 05/22/18 Yes [provider]  aspirin EC 81 MG tablet Take 81 mg by mouth every morning.   Yes [provider]  atorvastatin (LIPITOR) 40 MG tablet Take 40 mg by mouth at bedtime.   Yes [provider]  carvedilol (COREG) 25 MG tablet Take 25 mg by mouth 2 (two) times daily with a meal.   Yes [provider]  cetirizine (ZYRTEC) 10 MG  tablet Take 10 mg by mouth every morning.    Yes [provider]  cholecalciferol (VITAMIN D) 1000 UNITS tablet Take 1,000 Units by mouth every morning.    Yes [provider]  ferrous sulfate 325 (65 FE) MG tablet Take 325 mg by mouth daily with breakfast.   Yes [provider]  furosemide (LASIX) 40 MG tablet Take 1 tablet (40 mg total) by mouth daily. 02/08/18  Yes Nat Christen, MD  insulin glargine (LANTUS) 100 UNIT/ML injection Inject 43-50 Units into the skin at bedtime.    Yes [provider]  isosorbide mononitrate (IMDUR) 30 MG 24 hr tablet Take 0.5 tablets (15 mg  total) by mouth daily. 11/07/16 02/10/18 Yes Emokpae, Ejiroghene E, MD  losartan (COZAAR) 100 MG tablet Take 100 mg by mouth daily.  12/29/17  Yes [provider]  Misc Natural Products (LUTEIN VISION BLEND) CAPS Take 1 tablet by mouth 2 (two) times daily.   Yes [provider]  Omega-3 Fatty Acids (FISH OIL) 1000 MG CAPS Take 1,000 mg by mouth daily.    Yes [provider]  omeprazole (PRILOSEC) 20 MG capsule Take 20 mg by mouth every morning.    Yes [provider]  oxybutynin (DITROPAN-XL) 5 MG 24 hr tablet Take 5 mg by mouth 2 (two) times daily.    Yes [provider]  albuterol (PROVENTIL HFA;VENTOLIN HFA) 108 (90 BASE) MCG/ACT inhaler Inhale 1-2 puffs into the lungs every 6 (six) hours as needed for wheezing or shortness of breath.    [provider]  nitroGLYCERIN (NITROSTAT) 0.4 MG SL tablet Place 0.4 mg under the tongue every 5 (five) minutes as needed for chest pain.    [provider]    Review of Systems:  Negative except as mentioned on HPI.   Physical Exam: Vitals:   02/10/18 1630 02/10/18 1800 02/10/18 1958 02/10/18 2059  BP: (!) 112/58 (!) 116/55 (!) 129/59   Pulse: 78 72 74   Resp: 16 20 20    Temp:   98.2 F (36.8 C)   TempSrc:      SpO2: 98% 98% 100% 97%  Weight:   81.8 kg     Constitutional: mild distress due to SOB. Patient having difficulty speaking in full sentences, SOB with minimal exertion. Denies CP, no nausea, no vomiting. Eyes: PERRL, lids and conjunctivae normal, no icterus. ENMT: Mucous membranes are moist. Posterior pharynx clear of any exudate or lesions. No Thrush. Neck: normal, supple, no masses, no thyromegaly Respiratory: decrease BS at bases, mild exp wheezing and positive rhonchi; no using accessory muscles. Cardiovascular: Regular rate and rhythm, no murmurs / rubs / gallops appreciated. 2+ pedal pulses. No carotid bruits.  Abdomen: no tenderness, no masses palpated. No  hepatosplenomegaly. Bowel sounds positive.  Musculoskeletal: no clubbing / cyanosis. No joint deformity upper and lower extremities. Good ROM, no contractures. Normal muscle tone. Trace edema bilaterally. Skin: no rashes, lesions, ulcers. No induration Neurologic: CN 2-12 grossly intact. Sensation intact, DTR normal. Strength 4/5 in all 4 limbs due to poor effort.Marland Kitchen  Psychiatric: Normal judgment and insight. Alert and oriented x 3. Normal mood.   Labs on Admission: I have personally reviewed the following labs and imaging studies  CBC: Recent Labs  Lab 02/08/18 1020 02/10/18 1047  WBC 7.9 7.6  NEUTROABS 5.8 5.2  HGB 9.1* 9.6*  HCT 30.0* 31.0*  MCV 86.7 83.3  PLT 242 322   Basic Metabolic Panel: Recent Labs  Lab 02/08/18 1020 02/10/18 1047  NA 134* 133*  K 4.5 4.9  CL 105 101  CO2 19* 19*  GLUCOSE 123* 130*  BUN 66* 67*  CREATININE 2.37* 2.26*  CALCIUM 8.9 9.2   GFR: Estimated Creatinine Clearance: 16.8 mL/min (A) (by C-G formula based on SCr of 2.26 mg/dL (H)).   Liver Function Tests: Recent Labs  Lab 02/08/18 1020  AST 27  ALT 12  ALKPHOS 112  BILITOT 0.7  PROT 7.1  ALBUMIN 3.7   Coagulation Profile: Recent Labs  Lab 02/10/18 1047  INR 1.54   Cardiac Enzymes: Recent Labs  Lab 02/08/18 1020 02/08/18 1425 02/10/18 1047  TROPONINI 0.03* 0.03* <0.03   CBG: Recent Labs  Lab 02/08/18 1004  GLUCAP 120*   Urine analysis:    Component Value Date/Time   COLORURINE YELLOW 02/10/2018 1046   APPEARANCEUR HAZY (A) 02/10/2018 1046   LABSPEC 1.006 02/10/2018 1046   PHURINE 5.0 02/10/2018 1046   GLUCOSEU NEGATIVE 02/10/2018 1046   HGBUR SMALL (A) 02/10/2018 1046   BILIRUBINUR NEGATIVE 02/10/2018 [place] 02/10/2018 1046   PROTEINUR NEGATIVE 02/10/2018 1046   UROBILINOGEN 0.2 08/30/2014 1838   NITRITE NEGATIVE 02/10/2018 1046   LEUKOCYTESUR TRACE (A) 02/10/2018 1046   Radiological Exams on Admission: Dg Chest Portable 1  View  Result Date: 02/10/2018 CLINICAL DATA:  Increasing shortness of Breath EXAM: PORTABLE CHEST 1 VIEW COMPARISON:  02/08/2018 FINDINGS: Cardiac shadow is stable. Pacing device is again noted. Aortic calcifications are again seen. The lungs are well aerated bilaterally. No focal infiltrate or sizable effusion is seen. No bony abnormality is noted. IMPRESSION: No acute abnormality seen. Electronically Signed   By: Inez Catalina M.D.   On: 02/10/2018 10:59    EKG: Independently reviewed. No signs of acute ischemia.  Assessment/Plan 1-CHF NYHA class I-II, acute on chronic, diastolic (HCC) -Mild exacerbation of chronic diastolic heart failure -Recent 2D echo demonstrated Serpe ejection fraction (July 2019) -Troponin is negative -Chest x-ray without vascular congestion. -BNP elevated, mild crackles at the bases, trace edema in her lower extremities. -will resume Lasix 40 mg by mouth twice a day; patient received 1 dose of IV lasix in ED. -Continue metoprolol -Follow daily weights, strict intake and output and low-sodium diet. -Oxygen supplementation as needed -Follow clinical response. -flutter valve ordered  2-reactive airway/bronchitis -No findings of acute infiltrates on x-ray -Patient is afebrile, denies sick contacts, normal WBCs. -will hold on abx's -start atrovent, PRN xopenex, and pulmicort -follow clinical response -no frank diagnosis of prior asthma or COPD (LFT's not available)  3-Atrial fibrillation ([place]) -CHADSVASC score 4-5 -will monitor on telemetry -rate controlled -continue metoprolol -continue eliquis  4-Obstructive apnea -continue CPAP QHS  5-CKD (chronic kidney disease) stage 3-4, GFR 30-59 ml/min (HCC) -chronically stage 3, but progressing into stage 4 base on GFR -minimize nephrotoxic agents -follow closely while receiving lasix -hold ARB -monitor urine output  6-Essential hypertension -stable overall -continue current regimen (except for amlodipine  and ARB) and follow VS -low sodium diet ordered  7-Uncontrolled type 2 diabetes mellitus with hyperglycemia ([place]) -started on SSI and half of her home dose of lantus -follow CBG's -check A1C  DVT prophylaxis: Chronically on Eliquis. Code Status: Full code Family Communication: no family at bedside Disposition Plan: hopefully discharge back home with improved breathing status. Patient lives alone; so will need PT evaluation. Consults called: none  Admission status: observation, telemetry, LOS < 2 midnights.   Time Spent: 70 minutes  Barton Dubois MD [facility] Pager [phone]  If 7PM-7AM,  please contact night-coverage [email or web address] Password [email or web address]  02/10/2018, 9:07 PM

## 2018-02-10 NOTE — ED Triage Notes (Signed)
Pt went to urgent care today due to SOB since this morning. EMS called, placed pt on CPAP.  Pursed breathing.  Pt recently discharged home and told to increased lasix but no improvement

## 2018-02-10 NOTE — ED Provider Notes (Signed)
[facility] EMERGENCY DEPARTMENT Provider Note   CSN: [id number] Arrival date & time: 02/10/18  1036     History   Chief Complaint Chief Complaint  Patient presents with  . Shortness of Breath    HPI Summer Wong is a 84 y.o. female.  104   Shortness of Breath     Pt was seen at 1040. Per EMS and pt report:  Per pt, c/o gradual onset and worsening of persistent SOB over the past 2 to 3 days, worse today. Has been associated with increasing pedal edema and "weight gain." Pt was evaluated in the ED 2 days ago for her symptoms, dx CHF, and had her lasix increased. Pt states she has been taking the increased dose of lasix, using her home CPAP and sleeping sitting up in bed without improvement. Pt was evaluated at [facility], then sent to the ED for further evaluation. EMS states they transported pt with CPAP in place due to pt "being full of fluid." No reported low O2 Sats en route. Denies CP/palpitations, no back pain, no abd pain, no N/V/D, no fevers, no rash.    Past Medical History:  Diagnosis Date  . Arthritis   . Asthma   . Atrial fibrillation ([place])    Status post cardioversion in January 2016  . Baker's cyst   . Cancer ([place])    Uterine  . CHF (congestive heart failure) ([place])   . Chronic diastolic heart failure ([place])   . COPD (chronic obstructive pulmonary disease) ([place])   . Essential hypertension   . GERD (gastroesophageal reflux disease)   . Heart murmur   . History of hepatitis A   . Hyperlipemia   . Macular degeneration   . OSA on CPAP   . Pacemaker 2007   [employer] - sick sinus syndrome  . Renal insufficiency   . Sick sinus syndrome ([place]) 2007  . Silent myocardial infarction ([place]) 2006  . Stress incontinence   . TB lung, latent   . Type 2 diabetes mellitus [facility])     Patient Active Problem List   Diagnosis Date Noted  . Coronary artery disease   . Acute on chronic diastolic CHF (congestive heart failure) ([place]) 11/02/2016  . Angina pectoris ([place])  11/02/2016  . Uncontrolled type 2 diabetes mellitus with hyperglycemia ([place]) 11/02/2016  . Supratherapeutic INR   . CHF exacerbation ([place]) 02/27/2016  . Essential hypertension 02/27/2016  . Acute renal failure (ARF) ([place])   . Acute on chronic congestive heart failure ([place])   . Acute renal failure ([place])   . ARF (acute renal failure) ([place]) 10/16/2015  . CHF (congestive heart failure) ([place]) 10/15/2015  . CKD (chronic kidney disease) stage 3, GFR 30-59 ml/min (HCC) 10/15/2015  . Endometrial cancer ([place]) 08/16/2014  . FIGO stage II endometrial cancer ([place]) 07/11/2014  . Vulvovaginal candidiasis 07/11/2014  . Cutaneous candidiasis 07/11/2014  . Inactive tuberculosis of lung 03/08/2014  . Diabetes ([place]) 03/01/2014  . Arthritis, degenerative 03/01/2014  . Dyspnea 03/01/2014  . Atrial fibrillation ([place]) 10/13/2012  . Arteriosclerosis of coronary artery 10/13/2012  . BP (high blood pressure) 10/13/2012  . Obstructive apnea 10/13/2012  . Sick sinus syndrome ([place]) 10/13/2012    Past Surgical History:  Procedure Laterality Date  . BLADDER SUSPENSION    . CHOLECYSTECTOMY    . PACEMAKER INSERTION    . ROBOTIC ASSISTED TOTAL HYSTERECTOMY WITH BILATERAL SALPINGO OOPHERECTOMY Bilateral 08/16/2014   Procedure:  ROBOTIC ASSISTED TOTAL HYSTERECTOMY WITH BILATERAL SALPINGO OOPHORECTOMY AND LYMPHADENECTOMY;  Surgeon: Everitt Amber, MD;  Location: [facility] ORS;  Service: Gynecology;  Laterality: Bilateral;  . TUBAL LIGATION       OB History    Gravida      Para      Term      Preterm      AB      Living        SAB      TAB      Ectopic      Multiple      Live Births  2            Home Medications    Prior to Admission medications   Medication Sig Start Date End Date Taking? Authorizing Provider  amLODipine (NORVASC) 10 MG tablet Take 10 mg by mouth every morning.    Yes [provider]  apixaban (ELIQUIS) 2.5 MG TABS tablet Take 2.5 mg by mouth 2 (two) times daily.   11/23/17 05/22/18 Yes [provider]  aspirin EC 81 MG tablet Take 81 mg by mouth every morning.   Yes [provider]  atorvastatin (LIPITOR) 40 MG tablet Take 40 mg by mouth at bedtime.   Yes [provider]  carvedilol (COREG) 25 MG tablet Take 25 mg by mouth 2 (two) times daily with a meal.   Yes [provider]  cetirizine (ZYRTEC) 10 MG tablet Take 10 mg by mouth every morning.    Yes [provider]  cholecalciferol (VITAMIN D) 1000 UNITS tablet Take 1,000 Units by mouth every morning.    Yes [provider]  ferrous sulfate 325 (65 FE) MG tablet Take 325 mg by mouth daily with breakfast.   Yes [provider]  furosemide (LASIX) 40 MG tablet Take 1 tablet (40 mg total) by mouth daily. 02/08/18  Yes Nat Christen, MD  insulin glargine (LANTUS) 100 UNIT/ML injection Inject 43-50 Units into the skin at bedtime.    Yes [provider]  isosorbide mononitrate (IMDUR) 30 MG 24 hr tablet Take 0.5 tablets (15 mg total) by mouth daily. 11/07/16 02/10/18 Yes Emokpae, Ejiroghene E, MD  losartan (COZAAR) 100 MG tablet Take 100 mg by mouth daily.  12/29/17  Yes [provider]  Misc Natural Products (LUTEIN VISION BLEND) CAPS Take 1 tablet by mouth 2 (two) times daily.   Yes [provider]  Omega-3 Fatty Acids (FISH OIL) 1000 MG CAPS Take 1,000 mg by mouth daily.    Yes [provider]  omeprazole (PRILOSEC) 20 MG capsule Take 20 mg by mouth every morning.    Yes [provider]  oxybutynin (DITROPAN-XL) 5 MG 24 hr tablet Take 5 mg by mouth 2 (two) times daily.    Yes [provider]  albuterol (PROVENTIL HFA;VENTOLIN HFA) 108 (90 BASE) MCG/ACT inhaler Inhale 1-2 puffs into the lungs every 6 (six) hours as needed for wheezing or shortness of breath.    [provider]  nitroGLYCERIN (NITROSTAT) 0.4 MG SL tablet Place 0.4 mg under the tongue every 5 (five) minutes as needed for chest  pain.    [provider]    Family History Family History  Problem Relation Age of Onset  . Stomach cancer Father   . Colon cancer Brother     Social History Social History   Tobacco Use  . Smoking status: Never Smoker  . Smokeless tobacco: Never Used  Substance Use Topics  . Alcohol use: No  . Drug use: No  Allergies   Enalapril maleate; Sulfa antibiotics; and Vasotec [enalaprilat]   Review of Systems Review of Systems  Respiratory: Positive for shortness of breath.   ROS: Statement: All systems negative except as marked or noted in the HPI; Constitutional: Negative for fever and chills. ; ; Eyes: Negative for eye pain, redness and discharge. ; ; ENMT: Negative for ear pain, hoarseness, nasal congestion, sinus pressure and sore throat. ; ; Cardiovascular: Negative for chest pain, palpitations, diaphoresis, +dyspnea and peripheral edema. ; ; Respiratory: Negative for cough, wheezing and stridor. ; ; Gastrointestinal: Negative for nausea, vomiting, diarrhea, abdominal pain, blood in stool, hematemesis, jaundice and rectal bleeding. . ; ; Genitourinary: Negative for dysuria, flank pain and hematuria. ; ; Musculoskeletal: Negative for back pain and neck pain. Negative for swelling and trauma.; ; Skin: Negative for pruritus, rash, abrasions, blisters, bruising and skin lesion.; ; Neuro: Negative for headache, lightheadedness and neck stiffness. Negative for weakness, altered level of consciousness, altered mental status, extremity weakness, paresthesias, involuntary movement, seizure and syncope.        Physical Exam Updated Vital Signs BP (!) 104/57   Pulse 63   Resp (!) 21   Wt 82.6 kg   SpO2 99%   BMI 32.24 kg/m    Patient Vitals for the past 24 hrs:  BP Pulse Resp SpO2 Weight  02/10/18 1300 (!) 113/57 69 17 99 % -  02/10/18 1230 (!) 103/56 66 12 98 % -  02/10/18 1200 (!) 121/53 71 (!) 21 98 % -  02/10/18 1130 (!) 106/54 70 15 99 % -  02/10/18 1100 (!)  99/51 62 20 99 % -  02/10/18 1048 (!) 104/57 63 (!) 21 99 % -  02/10/18 1039 - - - - 82.6 kg    Physical Exam 1045: Physical examination:  Nursing notes reviewed; Vital signs and O2 SAT reviewed;  Constitutional: Well developed, Well nourished, Well hydrated, In no acute distress; Head:  Normocephalic, atraumatic; Eyes: EOMI, PERRL, No scleral icterus; ENMT: Mouth and pharynx normal, Mucous membranes moist; Neck: Supple, Full range of motion, No lymphadenopathy; Cardiovascular: Irregular rate and rhythm, No gallop; Respiratory: Breath sounds coarse & equal bilaterally, No wheezes.  Speaking full sentences with ease, Normal respiratory effort/excursion; Chest: Nontender, Movement normal; Abdomen: Soft, Nontender, Nondistended, Normal bowel sounds; Genitourinary: No CVA tenderness; Extremities: Peripheral pulses normal, No tenderness, +2 pedal edema bilat. No calf tenderness or asymmetry.; Neuro: AA&Ox3, Major CN grossly intact.  Speech clear. No gross focal motor or sensory deficits in extremities.; Skin: Color pale, Warm, Dry.   ED Treatments / Results  Labs (all labs ordered are listed, but only abnormal results are displayed)   EKG EKG Interpretation  Date/Time:  Thursday February 10 2018 12:42:24 EDT Ventricular Rate:  63 PR Interval:    QRS Duration: 86 QT Interval:  416 QTC Calculation: 425 R Axis:   81 Text Interpretation:  Atrial fibrillation Low voltage QRS Septal infarct , age undetermined When compared with ECG of 02/08/2018 No significant change was found Confirmed by Francine Graven [id number]) on 02/10/2018 12:56:48 PM   Radiology   Procedures Procedures (including critical care time)  Medications Ordered in ED Medications - No data to display   Initial Impression / Assessment and Plan / ED Course  I have reviewed the triage vital signs and the nursing notes.  Pertinent labs & imaging results that were available during my care of the patient were reviewed by me and  considered in my medical decision making (see chart  for details).  MDM Reviewed: previous chart, nursing note and vitals Reviewed previous: labs and ECG Interpretation: labs, ECG and x-ray   Results for orders placed or performed during the hospital encounter of 23/76/28  Basic metabolic panel  Result Value Ref Range   Sodium 133 (L) 135 - 145 mmol/L   Potassium 4.9 3.5 - 5.1 mmol/L   Chloride 101 98 - 111 mmol/L   CO2 19 (L) 22 - 32 mmol/L   Glucose, Bld 130 (H) 70 - 99 mg/dL   BUN 67 (H) 8 - 23 mg/dL   Creatinine, Ser 2.26 (H) 0.44 - 1.00 mg/dL   Calcium 9.2 8.9 - 10.3 mg/dL   GFR calc non Af Amer 18 (L) >60 mL/min   GFR calc Af Amer 21 (L) >60 mL/min   Anion gap 13 5 - 15  Brain natriuretic peptide  Result Value Ref Range   B Natriuretic Peptide 307.0 (H) 0.0 - 100.0 pg/mL  Troponin I  Result Value Ref Range   Troponin I <0.03 <0.03 ng/mL  CBC with Differential  Result Value Ref Range   WBC 7.6 4.0 - 10.5 K/uL   RBC 3.72 (L) 3.87 - 5.11 MIL/uL   Hemoglobin 9.6 (L) 12.0 - 15.0 g/dL   HCT 31.0 (L) 36.0 - 46.0 %   MCV 83.3 80.0 - 100.0 fL   MCH 25.8 (L) 26.0 - 34.0 pg   MCHC 31.0 30.0 - 36.0 g/dL   RDW 16.0 (H) 11.5 - 15.5 %   Platelets 250 150 - 400 K/uL   nRBC 0.0 0.0 - 0.2 %   Neutrophils Relative % 69 %   Neutro Abs 5.2 1.7 - 7.7 K/uL   Lymphocytes Relative 21 %   Lymphs Abs 1.6 0.7 - 4.0 K/uL   Monocytes Relative 7 %   Monocytes Absolute 0.5 0.1 - 1.0 K/uL   Eosinophils Relative 3 %   Eosinophils Absolute 0.2 0.0 - 0.5 K/uL   Basophils Relative 0 %   Basophils Absolute 0.0 0.0 - 0.1 K/uL   Immature Granulocytes 0 %   Abs Immature Granulocytes 0.02 0.00 - 0.07 K/uL  Urinalysis, Routine w reflex microscopic  Result Value Ref Range   Color, Urine YELLOW YELLOW   APPearance HAZY (A) CLEAR   Specific Gravity, Urine 1.006 1.005 - 1.030   pH 5.0 5.0 - 8.0   Glucose, UA NEGATIVE NEGATIVE mg/dL   Hgb urine dipstick SMALL (A) NEGATIVE   Bilirubin Urine NEGATIVE  NEGATIVE   Ketones, ur NEGATIVE NEGATIVE mg/dL   Protein, ur NEGATIVE NEGATIVE mg/dL   Nitrite NEGATIVE NEGATIVE   Leukocytes, UA TRACE (A) NEGATIVE   RBC / HPF 0-5 0 - 5 RBC/hpf   WBC, UA 0-5 0 - 5 WBC/hpf   Bacteria, UA RARE (A) NONE SEEN   Squamous Epithelial / LPF 0-5 0 - 5   Mucus PRESENT    Budding Yeast PRESENT    Hyaline Casts, UA PRESENT    Uric Acid Crys, UA PRESENT   Protime-INR  Result Value Ref Range   Prothrombin Time 18.3 (H) 11.4 - 15.2 seconds   INR 1.54    Dg Chest Portable 1 View Result Date: 02/10/2018 CLINICAL DATA:  Increasing shortness of Breath EXAM: PORTABLE CHEST 1 VIEW COMPARISON:  02/08/2018 FINDINGS: Cardiac shadow is stable. Pacing device is again noted. Aortic calcifications are again seen. The lungs are well aerated bilaterally. No focal infiltrate or sizable effusion is seen. No bony abnormality is noted. IMPRESSION: No acute abnormality  seen. Electronically Signed   By: Inez Catalina M.D.   On: 02/10/2018 10:59   Results for ED, RAYSON (MRN [id number]) as of 02/10/2018 13:19  Ref. Range 11/06/2016 06:15 11/09/2016 10:10 02/08/2018 10:20 02/10/2018 10:47  BUN Latest Ref Range: 8 - 23 mg/dL 41 (H) 38 (H) 66 (H) 67 (H)  Creatinine Latest Ref Range: 0.44 - 1.00 mg/dL 1.98 (H) 1.61 (H) 2.37 (H) 2.26 (H)   Results for DARCEY, CARDY (MRN [id number]) as of 02/10/2018 13:19  Ref. Range 02/29/2016 05:13 11/02/2016 08:27 11/04/2016 05:27 02/08/2018 10:20 02/10/2018 10:47  Hemoglobin Latest Ref Range: 12.0 - 15.0 g/dL 10.8 (L) 12.6 10.4 (L) 9.1 (L) 9.6 (L)  HCT Latest Ref Range: 36.0 - 46.0 % 33.2 (L) 39.1 31.8 (L) 30.0 (L) 31.0 (L)   Results for MAGDELYN, ROEBUCK (MRN [id number]) as of 02/10/2018 13:19  Ref. Range 11/02/2016 12:07 11/06/2016 06:15 02/10/2018 10:47  B Natriuretic Peptide Latest Ref Range: 0.0 - 100.0 pg/mL 239.0 (H) 262.0 (H) 307.0 (H)    1335:  Pt's 2nd ED visit for same symptoms, already increased lasix at home without improvement. Pt could only  ambulate at short distance before O2 Sats decreased to 90% R/A and pt c/o increasing SOB and RR. Will dose short neb (?hx asthma), IV lasix, admit. T/C returned from [facility]. Dyann Kief, case discussed, including:  HPI, pertinent PM/SHx, VS/PE, dx testing, ED course and treatment:  Agreeable to come to ED for evaluation for admission.     Final Clinical Impressions(s) / ED Diagnoses   Final diagnoses:  None    ED Discharge Orders    None       Francine Graven, DO 02/12/18 [id number]

## 2018-02-11 DIAGNOSIS — N183 Chronic kidney disease, stage 3 (moderate): Secondary | ICD-10-CM | POA: Diagnosis not present

## 2018-02-11 DIAGNOSIS — J4521 Mild intermittent asthma with (acute) exacerbation: Secondary | ICD-10-CM

## 2018-02-11 DIAGNOSIS — J45909 Unspecified asthma, uncomplicated: Secondary | ICD-10-CM

## 2018-02-11 DIAGNOSIS — I5033 Acute on chronic diastolic (congestive) heart failure: Secondary | ICD-10-CM | POA: Diagnosis not present

## 2018-02-11 DIAGNOSIS — I10 Essential (primary) hypertension: Secondary | ICD-10-CM | POA: Diagnosis not present

## 2018-02-11 DIAGNOSIS — I480 Paroxysmal atrial fibrillation: Secondary | ICD-10-CM | POA: Diagnosis not present

## 2018-02-11 LAB — BASIC METABOLIC PANEL
Anion gap: 9 (ref 5–15)
BUN: 66 mg/dL — AB (ref 8–23)
CHLORIDE: 103 mmol/L (ref 98–111)
CO2: 24 mmol/L (ref 22–32)
CREATININE: 2.28 mg/dL — AB (ref 0.44–1.00)
Calcium: 9.1 mg/dL (ref 8.9–10.3)
GFR calc Af Amer: 21 mL/min — ABNORMAL LOW (ref >60)
GFR calc non Af Amer: 18 mL/min — ABNORMAL LOW (ref >60)
GLUCOSE: 80 mg/dL (ref 70–99)
POTASSIUM: 4.5 mmol/L (ref 3.5–5.1)
Sodium: 136 mmol/L (ref 135–145)

## 2018-02-11 LAB — GLUCOSE, CAPILLARY
GLUCOSE-CAPILLARY: 115 mg/dL — AB (ref 70–99)
GLUCOSE-CAPILLARY: 88 mg/dL (ref 70–99)
GLUCOSE-CAPILLARY: 112 mg/dL — AB (ref 70–99)
Glucose-Capillary: 67 mg/dL — ABNORMAL LOW (ref 70–99)

## 2018-02-11 LAB — HEMOGLOBIN A1C
Hgb A1c MFr Bld: 6 % — ABNORMAL HIGH (ref 4.8–5.6)
Mean Plasma Glucose: 125.5 mg/dL

## 2018-02-11 MED ORDER — AMLODIPINE BESYLATE 10 MG PO TABS: 10.0000 mg | ORAL_TABLET | Freq: Every morning | ORAL | Status: AC

## 2018-02-11 MED ORDER — FUROSEMIDE 20 MG PO TABS: 30.0000 mg | ORAL_TABLET | Freq: Two times a day (BID) | ORAL | 1 refills | Status: AC

## 2018-02-11 MED ORDER — LOSARTAN POTASSIUM 100 MG PO TABS: 100.0000 mg | ORAL_TABLET | Freq: Every day | ORAL | 1 refills | Status: AC

## 2018-02-11 MED ORDER — IPRATROPIUM BROMIDE 0.02 % IN SOLN
0.5000 mg | Freq: Three times a day (TID) | RESPIRATORY_TRACT | Status: DC
  Administered 2018-02-11 (×2): 0.5 mg via RESPIRATORY_TRACT
  Filled 2018-02-11 (×2): qty 2.5

## 2018-02-11 MED ORDER — IPRATROPIUM BROMIDE HFA 17 MCG/ACT IN AERS: 1.0000 | INHALATION_SPRAY | Freq: Three times a day (TID) | RESPIRATORY_TRACT | 2 refills | Status: AC

## 2018-02-11 NOTE — Care Management Note (Signed)
Case Management Note  Patient Details  Name: Loyal Rudy MRN: [id number] Date of Birth: 13-May-1925  Subjective/Objective:      Admitted with CHF. Pt from home, lives alone, has very strong family support. ind with all ADL's. No HH or DME needs. Pt compliant with all medications and diet. Compliant with frequent weights. Pt has had 1 admission and 1 ED visit in past 6 months.               Action/Plan: DC home today with self care. No CM needs noted/communicated at this time.  Expected Discharge Date:      02/11/18            Expected Discharge Plan:  Home/Self Care  In-House Referral:  NA  Discharge planning Services  CM Consult  Post Acute Care Choice:  NA Choice offered to:  NA  Status of Service:  Completed, signed off  Sherald Barge, RN 02/11/2018, 11:37 AM

## 2018-02-11 NOTE — Discharge Summary (Signed)
Physician Discharge Summary  Summer Wong TTS:[id number] DOB: Sep 10, 1925 DOA: 02/10/2018  PCP: [facility] date: 02/10/2018 Discharge date: 02/11/2018  Time spent: 35 minutes  Recommendations for Outpatient Follow-up:  1. Repeat BMET to follow electrolytes and renal function  2. Reassess volume status and BP; adjust antihypertensive regimen as needed 3. Please follow on patient resp symptoms and arrange for PFT's. Further adjustment to inhaler therapy to be done as needed.   Discharge Diagnoses:  Principal Problem:   CHF NYHA class I, acute on chronic, diastolic (HCC) Active Problems:   Atrial fibrillation (HCC)   Obstructive apnea   CKD (chronic kidney disease) stage 3, GFR 30-59 ml/min (HCC)   Essential hypertension   Uncontrolled type 2 diabetes mellitus with hyperglycemia ([place])   Reactive airway disease   Discharge Condition: stable and improved. Discharge home with instructions to follow up with her PCP and cardiologist as previously arranged.   Diet recommendation: heart healthy and modified carbohydrates   Filed Weights   02/10/18 1039 02/10/18 1319 02/10/18 1958  Weight: 82.6 kg 82.6 kg 81.8 kg    History of present illness:  84 y.o. female with PMH of OSA, diastolic HF, atrial fibrillation, HLD, type 2 diabetes and CKD (stage 3-4); presented to ED with worsening SOB. Patient was seen 2 days ago in ED and at that time started on higher dose of lasix; w/o much improvement in her resp discomfort. Patient denies CP, fever, nausea, vomiting, abd pain, dysuria, hematuria or any other complaints. In ED BNP elevated, difficulty speaking in full sentences and requiring oxygen supplementation. Cr 2.26, normal K. IV lasix X1 given, albuterol nebulizer treatment provided. TRH called to place patient in observation for further evaluation and treatment.   Hospital Course:  1-CHF NYHA class I-II, acute on chronic, diastolic (HCC) -Mild exacerbation  of chronic diastolic heart failure -Recent 2D echo demonstrated preserved ejection fraction (July 2019) -Troponin is negative -Chest x-ray without vascular congestion. -BNP elevated, mild crackles at the bases, trace edema in her lower extremities on admission. -overall condition improved.  -will resume Lasix 30 mg BID at discharge -advise to follow low sodium diet and to check weight on daily basis. -follow volume status at follow up visit -patient had arranged follow up with cardiologist -continue B-blockers.   2-reactive airway/bronchitis -No findings of acute infiltrates on x-ray -Patient is afebrile, denies sick contacts, normal WBCs. -will hold on abx's for now -started on atrovent and continue PRN albuterol. -she is significantly improved, not needing oxygen supplementation and with improved air movement bilaterally. At discharge no wheezing. -will benefit of PFT's as an outpatient.   3-Atrial fibrillation (HCC) -CHADSVASC score 4-5 -rate controlled -continue metoprolol -continue eliquis  4-Obstructive apnea -continue CPAP QHS  5-CKD (chronic kidney disease) stage 3-4, GFR 30-59 ml/min (HCC) -chronically stage 3, but progressing into stage 4 base on GFR -minimize nephrotoxic agents -patient advise to maintain adequate hydration. -continue holding ARB at discharge -repeat BMET at follow up visit  6-Essential hypertension -soft, but stable overall -continue current regimen (except for amlodipine and ARB) and follow BP at follow up visit to readjust regimen as needed.  7-Uncontrolled type 2 diabetes mellitus with hyperglycemia ([place]) -resume home hypoglycemic regimen -advise to follow modified carb diet and to avoid skipping meals. -A1c 6.0  Procedures:  See below for x-ray reports.   Consultations:  None   Discharge Exam: Vitals:   02/11/18 1338 02/11/18 1458  BP: (!) 95/46   Pulse: 87  Resp: 18   Temp: 98.4 F (36.9 C)   SpO2: 97% 91%     General: afebrile, no CP, feeling much better, speaking in full sentences and able to perform activities with significant exertion.  Cardiovascular: rate controlled, no rubs, no gallops Respiratory: improved air movement bilaterally, no wheezing currently, no frank crackles.  Abd: soft, NT, ND, positive BS Extremities: no edema, no cyanosis, no clubbing   Discharge Instructions   Discharge Instructions    (HEART FAILURE PATIENTS) Call MD:  Anytime you have any of the following symptoms: 1) 3 pound weight gain in 24 hours or 5 pounds in 1 week 2) shortness of breath, with or without a dry hacking cough 3) swelling in the hands, feet or stomach 4) if you have to sleep on extra pillows at night in order to breathe.   Complete by:  As directed    Diet - low sodium heart healthy   Complete by:  As directed    Discharge instructions   Complete by:  As directed    Take medications as prescribed  Follow low sodium diet (less than 2000 mg daily) Check weight on daily basis  Use CPAP QHS Follow up with PCP and cardiology as scheduled  Maintain adequate hydration   Increase activity slowly   Complete by:  As directed      Allergies as of 02/11/2018      Reactions   Enalapril Maleate Swelling   Throat swelling   Sulfa Antibiotics Other (See Comments)   Does not remember.    Vasotec [enalaprilat] Swelling   Tongue.       Medication List    TAKE these medications   albuterol 108 (90 Base) MCG/ACT inhaler Commonly known as:  PROVENTIL HFA;VENTOLIN HFA Inhale 1-2 puffs into the lungs every 6 (six) hours as needed for wheezing or shortness of breath.   amLODipine 10 MG tablet Commonly known as:  NORVASC Take 1 tablet (10 mg total) by mouth every morning. Hold until follow up with PCP What changed:  additional instructions   apixaban 2.5 MG Tabs tablet Commonly known as:  ELIQUIS Take 2.5 mg by mouth 2 (two) times daily.   aspirin EC 81 MG tablet Take 81 mg by mouth every  morning.   atorvastatin 40 MG tablet Commonly known as:  LIPITOR Take 40 mg by mouth at bedtime.   carvedilol 25 MG tablet Commonly known as:  COREG Take 25 mg by mouth 2 (two) times daily with a meal.   cetirizine 10 MG tablet Commonly known as:  ZYRTEC Take 10 mg by mouth every morning.   cholecalciferol 1000 units tablet Commonly known as:  VITAMIN D Take 1,000 Units by mouth every morning.   ferrous sulfate 325 (65 FE) MG tablet Take 325 mg by mouth daily with breakfast.   Fish Oil 1000 MG Caps Take 1,000 mg by mouth daily.   furosemide 20 MG tablet Commonly known as:  LASIX Take 1.5 tablets (30 mg total) by mouth 2 (two) times daily. What changed:    medication strength  how much to take  when to take this   insulin glargine 100 UNIT/ML injection Commonly known as:  LANTUS Inject 43-50 Units into the skin at bedtime.   ipratropium 17 MCG/ACT inhaler Commonly known as:  ATROVENT HFA Inhale 1 puff into the lungs 3 (three) times daily.   isosorbide mononitrate 30 MG 24 hr tablet Commonly known as:  IMDUR Take 0.5 tablets (15 mg total) by mouth  daily.   losartan 100 MG tablet Commonly known as:  COZAAR Take 1 tablet (100 mg total) by mouth daily. Hold until follow up with PCP What changed:  additional instructions   LUTEIN VISION BLEND Caps Take 1 tablet by mouth 2 (two) times daily.   nitroGLYCERIN 0.4 MG SL tablet Commonly known as:  NITROSTAT Place 0.4 mg under the tongue every 5 (five) minutes as needed for chest pain.   omeprazole 20 MG capsule Commonly known as:  PRILOSEC Take 20 mg by mouth every morning.   oxybutynin 5 MG 24 hr tablet Commonly known as:  DITROPAN-XL Take 5 mg by mouth 2 (two) times daily.      Allergies  Allergen Reactions  . Enalapril Maleate Swelling    Throat swelling  . Sulfa Antibiotics Other (See Comments)    Does not remember.   Michail Sermon [Enalaprilat] Swelling    Tongue.    Follow-up Information    [facility] Follow up today.   Why:  follow up with PCP as scheduled. Contact information: [address] [place] [address] [phone]           The results of significant diagnostics from this hospitalization (including imaging, microbiology, ancillary and laboratory) are listed below for reference.    Significant Diagnostic Studies: Dg Chest 2 View  Result Date: 02/08/2018 CLINICAL DATA:  Shortness of breath for 1 month.  Evaluate for CHF EXAM: CHEST - 2 VIEW COMPARISON:  11/04/2016 FINDINGS: Heart is borderline in size. Left pacer remains in place, unchanged. Slight increased markings in the lung bases could reflect early interstitial edema. No effusions or acute bony abnormality. IMPRESSION: Borderline heart size with slight increased markings in the infrahilar regions which could reflect early interstitial edema. Electronically Signed   By: Rolm Baptise M.D.   On: 02/08/2018 11:52   Dg Chest Portable 1 View  Result Date: 02/10/2018 CLINICAL DATA:  Increasing shortness of Breath EXAM: PORTABLE CHEST 1 VIEW COMPARISON:  02/08/2018 FINDINGS: Cardiac shadow is stable. Pacing device is again noted. Aortic calcifications are again seen. The lungs are well aerated bilaterally. No focal infiltrate or sizable effusion is seen. No bony abnormality is noted. IMPRESSION: No acute abnormality seen. Electronically Signed   By: Inez Catalina M.D.   On: 02/10/2018 10:59   Labs: Basic Metabolic Panel: Recent Labs  Lab 02/08/18 1020 02/10/18 1047 02/11/18 0455  NA 134* 133* 136  K 4.5 4.9 4.5  CL 105 101 103  CO2 19* 19* 24  GLUCOSE 123* 130* 80  BUN 66* 67* 66*  CREATININE 2.37* 2.26* 2.28*  CALCIUM 8.9 9.2 9.1   Liver Function Tests: Recent Labs  Lab 02/08/18 1020  AST 27  ALT 12  ALKPHOS 112  BILITOT 0.7  PROT 7.1  ALBUMIN 3.7   CBC: Recent Labs  Lab 02/08/18 1020 02/10/18 1047  WBC 7.9 7.6  NEUTROABS 5.8 5.2  HGB 9.1* 9.6*  HCT 30.0* 31.0*   MCV 86.7 83.3  PLT 242 250   Cardiac Enzymes: Recent Labs  Lab 02/08/18 1020 02/08/18 1425 02/10/18 1047  TROPONINI 0.03* 0.03* <0.03   BNP: BNP (last 3 results) Recent Labs    02/10/18 1047  BNP 307.0*   CBG: Recent Labs  Lab 02/08/18 1004 02/11/18 0733 02/11/18 0837 02/11/18 1154  GLUCAP 120* 67* 115* 88   Signed:  Barton Dubois MD.  [facility] 02/11/2018, 4:57 PM

## 2018-02-11 NOTE — Evaluation (Signed)
Physical Therapy Evaluation Patient Details Name: Summer Wong MRN: [id number] DOB: 1925-09-26 Today's Date: 02/11/2018   History of Present Illness  Summer Wong is a 84 y.o. female with PMH of OSA, diastolic HF, atrial fibrillation, HLD, type 2 diabetes and CKD (stage 3-4); presented to ED with worsening SOB. Patient was seen 2 days ago in ED and at that time started on higher dose of lasix; w/o much improvement in her resp discomfort. Patient denies CP, fever, nausea, vomiting, abd pain, dysuria, hematuria or any other complaints.    Clinical Impression  Patient functioning at baseline for functional mobility and gait.  Patient able to ambulate in hallway on room air with O2 saturation at 96% - RN notified.  Patient tolerated sitting up in chair with her son present in room after therapy.  Plan:  Patient discharged from physical therapy to care of nursing for ambulation daily as tolerated for length of stay.     Follow Up Recommendations No PT follow up    Equipment Recommendations  None recommended by PT    Recommendations for Other Services       Precautions / Restrictions Precautions Precautions: None Restrictions Weight Bearing Restrictions: No      Mobility  Bed Mobility Overal bed mobility: Independent                Transfers Overall transfer level: Independent                  Ambulation/Gait Ambulation/Gait assistance: Supervision Gait Distance (Feet): 50 Feet Assistive device: None Gait Pattern/deviations: WFL(Within Functional Limits) Gait velocity: decreased   General Gait Details: grossly WFL, able to ambulate in hallway without loss of balance, mostly limited due to fatigue which is baseline per patient  Stairs            Wheelchair Mobility    Modified Rankin (Stroke Patients Only)       Balance Overall balance assessment: No apparent balance deficits (not formally assessed)                                            Pertinent Vitals/Pain Pain Assessment: No/denies pain    Home Living Family/patient expects to be discharged to:: Private residence Living Arrangements: Alone Available Help at Discharge: Family(son lives next door) Type of Home: House Home Access: Stairs to enter Entrance Stairs-Rails: Right;Left;Can reach both Technical brewer of Steps: 3 Home Layout: One level Home Equipment: Environmental consultant - 4 wheels;Cane - single point      Prior Function Level of Independence: Needs assistance   Gait / Transfers Assistance Needed: houehold ambulator without AD, sometimes uses Rollator for longer distances  ADL's / Homemaking Assistance Needed: most of time can do by herself        Hand Dominance        Extremity/Trunk Assessment   Upper Extremity Assessment Upper Extremity Assessment: Overall WFL for tasks assessed    Lower Extremity Assessment Lower Extremity Assessment: Overall WFL for tasks assessed    Cervical / Trunk Assessment Cervical / Trunk Assessment: Normal  Communication   Communication: No difficulties  Cognition Arousal/Alertness: Awake/alert Behavior During Therapy: WFL for tasks assessed/performed Overall Cognitive Status: Within Functional Limits for tasks assessed  General Comments      Exercises     Assessment/Plan    PT Assessment Patent does not need any further PT services  PT Problem List         PT Treatment Interventions      PT Goals (Current goals can be found in the Care Plan section)  Acute Rehab PT Goals Patient Stated Goal: return home PT Goal Formulation: With patient Time For Goal Achievement: 02/11/18 Potential to Achieve Goals: Good    Frequency     Barriers to discharge        Co-evaluation               AM-PAC PT "6 Clicks" Daily Activity  Outcome Measure Difficulty turning over in bed (including adjusting bedclothes, sheets and blankets)?:  None Difficulty moving from lying on back to sitting on the side of the bed? : None Difficulty sitting down on and standing up from a chair with arms (e.g., wheelchair, bedside commode, etc,.)?: None Help needed moving to and from a bed to chair (including a wheelchair)?: None Help needed walking in hospital room?: None Help needed climbing 3-5 steps with a railing? : None 6 Click Score: 24    End of Session   Activity Tolerance: Patient tolerated treatment well;Patient limited by fatigue Patient left: in chair;with call bell/phone within reach;with family/visitor present Nurse Communication: Mobility status      Time: 4917-9150 PT Time Calculation (min) (ACUTE ONLY): 22 min   Charges:   PT Evaluation $PT Eval Moderate Complexity: 1 Mod PT Treatments $Therapeutic Activity: 8-22 mins        12:26 PM, 02/11/18 Lonell Grandchild, MPT Physical Therapist with [facility] [phone] office [phone] mobile phone

## 2018-02-11 NOTE — Care Management Obs Status (Signed)
[facility] NOTIFICATION   Patient Details  Name: Nickie Warwick MRN: [id number] Date of Birth: [birth year]   Medicare Observation Status Notification Given:  Yes    Shelda Altes 02/11/2018, 9:51 AM

## 2018-02-11 NOTE — ED Provider Notes (Addendum)
[facility] EMERGENCY DEPARTMENT Provider Note   CSN: [id number] Arrival date & time: 02/08/18  1761     History   Chief Complaint Chief Complaint  Patient presents with  . Shortness of Breath    HPI Summer Wong is a 84 y.o. female.  Note from 02/08/2018: Patient complains of dyspnea for 2 weeks with minimal bilateral lower extremity edema.  She reports a 2 pound weight gain recently.  No substernal chest pain, cough, fever, sweats, chills.  Glucose earlier in the day was 88.  She is feeling weak.  Severity of symptoms is mild to moderate.  Symptoms are exertional, but she is always dyspneic with exertion.     Past Medical History:  Diagnosis Date  . Arthritis   . Asthma   . Atrial fibrillation ([place])    Status post cardioversion in January 2016  . Baker's cyst   . Cancer ([place])    Uterine  . CHF (congestive heart failure) ([place])   . Chronic diastolic heart failure ([place])   . COPD (chronic obstructive pulmonary disease) ([place])   . Essential hypertension   . GERD (gastroesophageal reflux disease)   . Heart murmur   . History of hepatitis A   . Hyperlipemia   . Macular degeneration   . OSA on CPAP   . Pacemaker 2007   [employer] - sick sinus syndrome  . Renal insufficiency   . Sick sinus syndrome ([place]) 2007  . Silent myocardial infarction ([place]) 2006  . Stress incontinence   . TB lung, latent   . Type 2 diabetes mellitus [facility])     Patient Active Problem List   Diagnosis Date Noted  . CHF NYHA class I, acute on chronic, diastolic ([place]) 60/73/7106  . Coronary artery disease   . Acute on chronic diastolic CHF (congestive heart failure) ([place]) 11/02/2016  . Angina pectoris ([place]) 11/02/2016  . Uncontrolled type 2 diabetes mellitus with hyperglycemia ([place]) 11/02/2016  . Supratherapeutic INR   . CHF exacerbation ([place]) 02/27/2016  . Essential hypertension 02/27/2016  . Acute renal failure (ARF) ([place])   . Acute on chronic congestive heart failure ([place])   . Acute  renal failure ([place])   . ARF (acute renal failure) ([place]) 10/16/2015  . CHF (congestive heart failure) ([place]) 10/15/2015  . CKD (chronic kidney disease) stage 3, GFR 30-59 ml/min (HCC) 10/15/2015  . Endometrial cancer ([place]) 08/16/2014  . FIGO stage II endometrial cancer ([place]) 07/11/2014  . Vulvovaginal candidiasis 07/11/2014  . Cutaneous candidiasis 07/11/2014  . Inactive tuberculosis of lung 03/08/2014  . Diabetes ([place]) 03/01/2014  . Arthritis, degenerative 03/01/2014  . Dyspnea 03/01/2014  . Atrial fibrillation ([place]) 10/13/2012  . Arteriosclerosis of coronary artery 10/13/2012  . BP (high blood pressure) 10/13/2012  . Obstructive apnea 10/13/2012  . Sick sinus syndrome ([place]) 10/13/2012    Past Surgical History:  Procedure Laterality Date  . BLADDER SUSPENSION    . CHOLECYSTECTOMY    . PACEMAKER INSERTION    . ROBOTIC ASSISTED TOTAL HYSTERECTOMY WITH BILATERAL SALPINGO OOPHERECTOMY Bilateral 08/16/2014   Procedure:  ROBOTIC ASSISTED TOTAL HYSTERECTOMY WITH BILATERAL SALPINGO OOPHORECTOMY AND LYMPHADENECTOMY;  Surgeon: Everitt Amber, MD;  Location: [facility] ORS;  Service: Gynecology;  Laterality: Bilateral;  . TUBAL LIGATION       OB History    Gravida      Para      Term      Preterm      AB      Living  SAB      TAB      Ectopic      Multiple      Live Births  2            Home Medications    Prior to Admission medications   Medication Sig Start Date End Date Taking? Authorizing Provider  amLODipine (NORVASC) 10 MG tablet Take 10 mg by mouth every morning.    Yes [provider]  apixaban (ELIQUIS) 2.5 MG TABS tablet Take 2.5 mg by mouth 2 (two) times daily.  11/23/17 05/22/18 Yes [provider]  aspirin EC 81 MG tablet Take 81 mg by mouth every morning.   Yes [provider]  atorvastatin (LIPITOR) 40 MG tablet Take 40 mg by mouth at bedtime.   Yes [provider]  carvedilol (COREG) 25 MG tablet Take 25 mg by mouth 2  (two) times daily with a meal.   Yes [provider]  cetirizine (ZYRTEC) 10 MG tablet Take 10 mg by mouth every morning.    Yes [provider]  cholecalciferol (VITAMIN D) 1000 UNITS tablet Take 1,000 Units by mouth every morning.    Yes [provider]  ferrous sulfate 325 (65 FE) MG tablet Take 325 mg by mouth daily with breakfast.   Yes [provider]  insulin glargine (LANTUS) 100 UNIT/ML injection Inject 43-50 Units into the skin at bedtime.    Yes [provider]  isosorbide mononitrate (IMDUR) 30 MG 24 hr tablet Take 0.5 tablets (15 mg total) by mouth daily. 11/07/16 02/10/18 Yes Emokpae, Ejiroghene E, MD  losartan (COZAAR) 100 MG tablet Take 100 mg by mouth daily.  12/29/17  Yes [provider]  Misc Natural Products (LUTEIN VISION BLEND) CAPS Take 1 tablet by mouth 2 (two) times daily.   Yes [provider]  Omega-3 Fatty Acids (FISH OIL) 1000 MG CAPS Take 1,000 mg by mouth daily.    Yes [provider]  omeprazole (PRILOSEC) 20 MG capsule Take 20 mg by mouth every morning.    Yes [provider]  oxybutynin (DITROPAN-XL) 5 MG 24 hr tablet Take 5 mg by mouth 2 (two) times daily.    Yes [provider]  albuterol (PROVENTIL HFA;VENTOLIN HFA) 108 (90 BASE) MCG/ACT inhaler Inhale 1-2 puffs into the lungs every 6 (six) hours as needed for wheezing or shortness of breath.    [provider]  furosemide (LASIX) 40 MG tablet Take 1 tablet (40 mg total) by mouth daily. 02/08/18   Nat Christen, MD  nitroGLYCERIN (NITROSTAT) 0.4 MG SL tablet Place 0.4 mg under the tongue every 5 (five) minutes as needed for chest pain.    [provider]    Family History Family History  Problem Relation Age of Onset  . Stomach cancer Father   . Colon cancer Brother     Social History Social History   Tobacco Use  . Smoking status: Never Smoker  . Smokeless tobacco: Never Used  Substance Use Topics    . Alcohol use: No  . Drug use: No     Allergies   Enalapril maleate; Sulfa antibiotics; and Vasotec [enalaprilat]   Review of Systems Review of Systems  All other systems reviewed and are negative.    Physical Exam Updated Vital Signs BP (!) 105/48   Pulse 65   Resp 20   Ht 5\' 3"  (1.6 m)   Wt 82.8 kg   SpO2 92%   BMI 32.35 kg/m  Physical Exam  Constitutional: She is oriented to person, place, and time. She appears well-developed and well-nourished.  HENT:  Head: Normocephalic and atraumatic.  Eyes: Conjunctivae are normal.  Neck: Neck supple.  Cardiovascular: Normal rate and regular rhythm.  Pulmonary/Chest: Effort normal and breath sounds normal.  No rales noted.  Abdominal: Soft. Bowel sounds are normal.  Musculoskeletal: Normal range of motion.  Neurological: She is alert and oriented to person, place, and time.  Skin: Skin is warm and dry.  Minimal peripheral edema.  Psychiatric: She has a normal mood and affect. Her behavior is normal.  Nursing note and vitals reviewed.    ED Treatments / Results  Labs (all labs ordered are listed, but only abnormal results are displayed) Labs Reviewed  CBC WITH DIFFERENTIAL/PLATELET - Abnormal; Notable for the following components:      Result Value   RBC 3.46 (*)    Hemoglobin 9.1 (*)    HCT 30.0 (*)    RDW 16.0 (*)    All other components within normal limits  COMPREHENSIVE METABOLIC PANEL - Abnormal; Notable for the following components:   Sodium 134 (*)    CO2 19 (*)    Glucose, Bld 123 (*)    BUN 66 (*)    Creatinine, Ser 2.37 (*)    GFR calc non Af Amer 17 (*)    GFR calc Af Amer 20 (*)    All other components within normal limits  TROPONIN I - Abnormal; Notable for the following components:   Troponin I 0.03 (*)    All other components within normal limits  TROPONIN I - Abnormal; Notable for the following components:   Troponin I 0.03 (*)    All other components within normal limits  CBG  MONITORING, ED - Abnormal; Notable for the following components:   Glucose-Capillary 120 (*)    All other components within normal limits    EKG EKG Interpretation  Date/Time:  Tuesday February 08 2018 10:00:00 EDT Ventricular Rate:  81 PR Interval:    QRS Duration: 106 QT Interval:  403 QTC Calculation: 468 R Axis:   -33 Text Interpretation:  Atrial fibrillation Ventricular premature complex Left axis deviation Anterior infarct, old Confirmed by Addison Lank [phone]) on 02/09/2018 9:36:13 PM   Radiology Dg Chest Portable 1 View  Result Date: 02/10/2018 CLINICAL DATA:  Increasing shortness of Breath EXAM: PORTABLE CHEST 1 VIEW COMPARISON:  02/08/2018 FINDINGS: Cardiac shadow is stable. Pacing device is again noted. Aortic calcifications are again seen. The lungs are well aerated bilaterally. No focal infiltrate or sizable effusion is seen. No bony abnormality is noted. IMPRESSION: No acute abnormality seen. Electronically Signed   By: Inez Catalina M.D.   On: 02/10/2018 10:59    Procedures Procedures (including critical care time)  Medications Ordered in ED Medications  furosemide (LASIX) injection 40 mg (40 mg Intravenous Given 02/08/18 1059)     Initial Impression / Assessment and Plan / ED Course  I have reviewed the triage vital signs and the nursing notes.  Pertinent labs & imaging results that were available during my care of the patient were reviewed by me and considered in my medical decision making (see chart for details).     Patient presents with dyspnea for 2 weeks.  Hemoglobin 9.1.  Delta troponin stable at 0.03.  Creatinine has been elevated in the past and is slightly higher today at 2.3.  Chest x-ray suggests possible early interstitial edema.  Patient was given intravenous Lasix in the  ED and felt better.  We discussed her treatment plan.  She will double her oral Lasix at home and reevaluate her clinical situation.  It is my opinion that she was clinically  stable for discharge.  Final Clinical Impressions(s) / ED Diagnoses   Final diagnoses:  Acute pulmonary edema [facility])    ED Discharge Orders         Ordered    furosemide (LASIX) 40 MG tablet  Daily     02/08/18 1558           Nat Christen, MD 02/11/18 1145    Nat Christen, MD 02/11/18 1146

## 2018-02-11 NOTE — Progress Notes (Signed)
IV removed, patient tolerated well.  Reviewed AVS with patient who verbalized understanding.  Patient transported home by her son.

## 2018-02-14 ENCOUNTER — Emergency Department (HOSPITAL_COMMUNITY): Payer: Medicare HMO

## 2018-02-14 ENCOUNTER — Inpatient Hospital Stay (HOSPITAL_COMMUNITY)
Admission: EM | Admit: 2018-02-14 | Discharge: 2018-02-25 | DRG: 190 | Disposition: E | Payer: Medicare HMO | Attending: Internal Medicine | Admitting: Internal Medicine

## 2018-02-14 ENCOUNTER — Other Ambulatory Visit: Payer: Self-pay

## 2018-02-14 ENCOUNTER — Encounter (HOSPITAL_COMMUNITY): Payer: Self-pay | Admitting: Emergency Medicine

## 2018-02-14 DIAGNOSIS — J441 Chronic obstructive pulmonary disease with (acute) exacerbation: Secondary | ICD-10-CM | POA: Diagnosis present

## 2018-02-14 DIAGNOSIS — C7989 Secondary malignant neoplasm of other specified sites: Secondary | ICD-10-CM | POA: Diagnosis present

## 2018-02-14 DIAGNOSIS — I252 Old myocardial infarction: Secondary | ICD-10-CM

## 2018-02-14 DIAGNOSIS — E1122 Type 2 diabetes mellitus with diabetic chronic kidney disease: Secondary | ICD-10-CM | POA: Diagnosis present

## 2018-02-14 DIAGNOSIS — I959 Hypotension, unspecified: Secondary | ICD-10-CM | POA: Diagnosis not present

## 2018-02-14 DIAGNOSIS — L299 Pruritus, unspecified: Secondary | ICD-10-CM | POA: Diagnosis not present

## 2018-02-14 DIAGNOSIS — M1990 Unspecified osteoarthritis, unspecified site: Secondary | ICD-10-CM | POA: Diagnosis present

## 2018-02-14 DIAGNOSIS — J9601 Acute respiratory failure with hypoxia: Secondary | ICD-10-CM | POA: Diagnosis not present

## 2018-02-14 DIAGNOSIS — Z90722 Acquired absence of ovaries, bilateral: Secondary | ICD-10-CM

## 2018-02-14 DIAGNOSIS — Z950 Presence of cardiac pacemaker: Secondary | ICD-10-CM

## 2018-02-14 DIAGNOSIS — D649 Anemia, unspecified: Secondary | ICD-10-CM

## 2018-02-14 DIAGNOSIS — Y92230 Patient room in hospital as the place of occurrence of the external cause: Secondary | ICD-10-CM | POA: Diagnosis not present

## 2018-02-14 DIAGNOSIS — Z515 Encounter for palliative care: Secondary | ICD-10-CM

## 2018-02-14 DIAGNOSIS — Z9851 Tubal ligation status: Secondary | ICD-10-CM

## 2018-02-14 DIAGNOSIS — H3530 Unspecified macular degeneration: Secondary | ICD-10-CM | POA: Diagnosis present

## 2018-02-14 DIAGNOSIS — I10 Essential (primary) hypertension: Secondary | ICD-10-CM | POA: Diagnosis present

## 2018-02-14 DIAGNOSIS — Z90710 Acquired absence of both cervix and uterus: Secondary | ICD-10-CM

## 2018-02-14 DIAGNOSIS — G4733 Obstructive sleep apnea (adult) (pediatric): Secondary | ICD-10-CM

## 2018-02-14 DIAGNOSIS — F419 Anxiety disorder, unspecified: Secondary | ICD-10-CM | POA: Diagnosis not present

## 2018-02-14 DIAGNOSIS — E871 Hypo-osmolality and hyponatremia: Secondary | ICD-10-CM

## 2018-02-14 DIAGNOSIS — Z7982 Long term (current) use of aspirin: Secondary | ICD-10-CM

## 2018-02-14 DIAGNOSIS — J45901 Unspecified asthma with (acute) exacerbation: Secondary | ICD-10-CM

## 2018-02-14 DIAGNOSIS — K219 Gastro-esophageal reflux disease without esophagitis: Secondary | ICD-10-CM | POA: Diagnosis present

## 2018-02-14 DIAGNOSIS — G893 Neoplasm related pain (acute) (chronic): Secondary | ICD-10-CM | POA: Diagnosis present

## 2018-02-14 DIAGNOSIS — C799 Secondary malignant neoplasm of unspecified site: Secondary | ICD-10-CM

## 2018-02-14 DIAGNOSIS — Z800 Family history of malignant neoplasm of digestive organs: Secondary | ICD-10-CM

## 2018-02-14 DIAGNOSIS — Z794 Long term (current) use of insulin: Secondary | ICD-10-CM

## 2018-02-14 DIAGNOSIS — R072 Precordial pain: Secondary | ICD-10-CM | POA: Diagnosis not present

## 2018-02-14 DIAGNOSIS — E119 Type 2 diabetes mellitus without complications: Secondary | ICD-10-CM

## 2018-02-14 DIAGNOSIS — Z8542 Personal history of malignant neoplasm of other parts of uterus: Secondary | ICD-10-CM

## 2018-02-14 DIAGNOSIS — I5033 Acute on chronic diastolic (congestive) heart failure: Secondary | ICD-10-CM | POA: Diagnosis not present

## 2018-02-14 DIAGNOSIS — I130 Hypertensive heart and chronic kidney disease with heart failure and stage 1 through stage 4 chronic kidney disease, or unspecified chronic kidney disease: Secondary | ICD-10-CM | POA: Diagnosis present

## 2018-02-14 DIAGNOSIS — I4891 Unspecified atrial fibrillation: Secondary | ICD-10-CM

## 2018-02-14 DIAGNOSIS — Z9989 Dependence on other enabling machines and devices: Secondary | ICD-10-CM

## 2018-02-14 DIAGNOSIS — I5032 Chronic diastolic (congestive) heart failure: Secondary | ICD-10-CM

## 2018-02-14 DIAGNOSIS — N184 Chronic kidney disease, stage 4 (severe): Secondary | ICD-10-CM

## 2018-02-14 DIAGNOSIS — I482 Chronic atrial fibrillation, unspecified: Secondary | ICD-10-CM | POA: Diagnosis present

## 2018-02-14 DIAGNOSIS — R079 Chest pain, unspecified: Secondary | ICD-10-CM

## 2018-02-14 DIAGNOSIS — I2510 Atherosclerotic heart disease of native coronary artery without angina pectoris: Secondary | ICD-10-CM | POA: Diagnosis present

## 2018-02-14 DIAGNOSIS — Z6832 Body mass index (BMI) 32.0-32.9, adult: Secondary | ICD-10-CM

## 2018-02-14 DIAGNOSIS — T402X5A Adverse effect of other opioids, initial encounter: Secondary | ICD-10-CM | POA: Diagnosis not present

## 2018-02-14 DIAGNOSIS — R0602 Shortness of breath: Secondary | ICD-10-CM

## 2018-02-14 DIAGNOSIS — Z66 Do not resuscitate: Secondary | ICD-10-CM | POA: Diagnosis present

## 2018-02-14 DIAGNOSIS — Z9049 Acquired absence of other specified parts of digestive tract: Secondary | ICD-10-CM

## 2018-02-14 DIAGNOSIS — E785 Hyperlipidemia, unspecified: Secondary | ICD-10-CM | POA: Diagnosis present

## 2018-02-14 DIAGNOSIS — Z7901 Long term (current) use of anticoagulants: Secondary | ICD-10-CM

## 2018-02-14 DIAGNOSIS — D631 Anemia in chronic kidney disease: Secondary | ICD-10-CM | POA: Diagnosis present

## 2018-02-14 DIAGNOSIS — C7889 Secondary malignant neoplasm of other digestive organs: Secondary | ICD-10-CM | POA: Diagnosis present

## 2018-02-14 DIAGNOSIS — Z79899 Other long term (current) drug therapy: Secondary | ICD-10-CM

## 2018-02-14 DIAGNOSIS — I495 Sick sinus syndrome: Secondary | ICD-10-CM | POA: Diagnosis present

## 2018-02-14 LAB — TROPONIN I: Troponin I: <0.03 ng/mL (ref <0.03)

## 2018-02-14 LAB — BASIC METABOLIC PANEL
Anion gap: 11 (ref 5–15)
BUN: 80 mg/dL — ABNORMAL HIGH (ref 8–23)
CHLORIDE: 96 mmol/L — AB (ref 98–111)
CO2: 22 mmol/L (ref 22–32)
Calcium: 8.7 mg/dL — ABNORMAL LOW (ref 8.9–10.3)
Creatinine, Ser: 2.48 mg/dL — ABNORMAL HIGH (ref 0.44–1.00)
GFR calc non Af Amer: 16 mL/min — ABNORMAL LOW (ref >60)
GFR, EST AFRICAN AMERICAN: 19 mL/min — AB (ref >60)
Glucose, Bld: 145 mg/dL — ABNORMAL HIGH (ref 70–99)
POTASSIUM: 5 mmol/L (ref 3.5–5.1)
SODIUM: 129 mmol/L — AB (ref 135–145)

## 2018-02-14 LAB — CBC
HCT: 29.3 % — ABNORMAL LOW (ref 36.0–46.0)
HEMOGLOBIN: 9.1 g/dL — AB (ref 12.0–15.0)
MCH: 25.9 pg — ABNORMAL LOW (ref 26.0–34.0)
MCHC: 31.1 g/dL (ref 30.0–36.0)
MCV: 83.5 fL (ref 80.0–100.0)
NRBC: 0 % (ref 0.0–0.2)
Platelets: 219 10*3/uL (ref 150–400)
RBC: 3.51 MIL/uL — ABNORMAL LOW (ref 3.87–5.11)
RDW: 15.6 % — AB (ref 11.5–15.5)
WBC: 6.8 10*3/uL (ref 4.0–10.5)

## 2018-02-14 LAB — BRAIN NATRIURETIC PEPTIDE: B NATRIURETIC PEPTIDE 5: 274 pg/mL — AB (ref 0.0–100.0)

## 2018-02-14 MED ORDER — IPRATROPIUM-ALBUTEROL 0.5-2.5 (3) MG/3ML IN SOLN
3.0000 mL | Freq: Four times a day (QID) | RESPIRATORY_TRACT | Status: DC
  Administered 2018-02-14 – 2018-02-15 (×5): 3 mL via RESPIRATORY_TRACT
  Filled 2018-02-14 (×5): qty 3

## 2018-02-14 MED ORDER — ASPIRIN EC 81 MG PO TBEC
81.0000 mg | DELAYED_RELEASE_TABLET | Freq: Every morning | ORAL | Status: DC
  Administered 2018-02-15 – 2018-02-19 (×5): 81 mg via ORAL
  Filled 2018-02-14 (×6): qty 1

## 2018-02-14 MED ORDER — FERROUS SULFATE 325 (65 FE) MG PO TABS
325.0000 mg | ORAL_TABLET | Freq: Every day | ORAL | Status: DC
  Administered 2018-02-15 – 2018-02-19 (×5): 325 mg via ORAL
  Filled 2018-02-14 (×8): qty 1

## 2018-02-14 MED ORDER — LORATADINE 10 MG PO TABS
10.0000 mg | ORAL_TABLET | Freq: Every day | ORAL | Status: DC
  Administered 2018-02-15 – 2018-02-19 (×5): 10 mg via ORAL
  Filled 2018-02-14 (×6): qty 1

## 2018-02-14 MED ORDER — INSULIN GLARGINE 100 UNIT/ML ~~LOC~~ SOLN
10.0000 [IU] | Freq: Every day | SUBCUTANEOUS | Status: DC
  Administered 2018-02-14 – 2018-02-19 (×6): 10 [IU] via SUBCUTANEOUS
  Filled 2018-02-14 (×7): qty 0.1

## 2018-02-14 MED ORDER — DM-GUAIFENESIN ER 30-600 MG PO TB12
1.0000 | ORAL_TABLET | Freq: Two times a day (BID) | ORAL | Status: DC
  Administered 2018-02-15 – 2018-02-19 (×10): 1 via ORAL
  Filled 2018-02-14 (×11): qty 1

## 2018-02-14 MED ORDER — OMEGA-3-ACID ETHYL ESTERS 1 G PO CAPS
1.0000 g | ORAL_CAPSULE | Freq: Every day | ORAL | Status: DC
  Administered 2018-02-15 – 2018-02-19 (×5): 1 g via ORAL
  Filled 2018-02-14 (×8): qty 1

## 2018-02-14 MED ORDER — ATORVASTATIN CALCIUM 40 MG PO TABS
40.0000 mg | ORAL_TABLET | Freq: Every day | ORAL | Status: DC
  Administered 2018-02-15 – 2018-02-19 (×5): 40 mg via ORAL
  Filled 2018-02-14 (×6): qty 1

## 2018-02-14 MED ORDER — VITAMIN D 1000 UNITS PO TABS
1000.0000 [IU] | ORAL_TABLET | Freq: Every morning | ORAL | Status: DC
  Administered 2018-02-15 – 2018-02-19 (×5): 1000 [IU] via ORAL
  Filled 2018-02-14 (×8): qty 1

## 2018-02-14 MED ORDER — CARVEDILOL 12.5 MG PO TABS
25.0000 mg | ORAL_TABLET | Freq: Two times a day (BID) | ORAL | Status: DC
  Administered 2018-02-15 – 2018-02-19 (×9): 25 mg via ORAL
  Filled 2018-02-14 (×10): qty 2

## 2018-02-14 MED ORDER — APIXABAN 2.5 MG PO TABS
2.5000 mg | ORAL_TABLET | Freq: Two times a day (BID) | ORAL | Status: DC
  Administered 2018-02-14 – 2018-02-17 (×7): 2.5 mg via ORAL
  Filled 2018-02-14 (×11): qty 1

## 2018-02-14 MED ORDER — OXYBUTYNIN CHLORIDE ER 5 MG PO TB24
5.0000 mg | ORAL_TABLET | Freq: Two times a day (BID) | ORAL | Status: DC
  Administered 2018-02-15 – 2018-02-19 (×10): 5 mg via ORAL
  Filled 2018-02-14 (×15): qty 1

## 2018-02-14 MED ORDER — IPRATROPIUM-ALBUTEROL 0.5-2.5 (3) MG/3ML IN SOLN
3.0000 mL | Freq: Once | RESPIRATORY_TRACT | Status: AC
  Administered 2018-02-14: 3 mL via RESPIRATORY_TRACT
  Filled 2018-02-14: qty 3

## 2018-02-14 MED ORDER — ISOSORBIDE MONONITRATE ER 30 MG PO TB24
15.0000 mg | ORAL_TABLET | Freq: Every day | ORAL | Status: DC
  Administered 2018-02-15 – 2018-02-19 (×5): 15 mg via ORAL
  Filled 2018-02-14 (×8): qty 1

## 2018-02-14 MED ORDER — PANTOPRAZOLE SODIUM 40 MG PO TBEC
40.0000 mg | DELAYED_RELEASE_TABLET | Freq: Every day | ORAL | Status: DC
  Administered 2018-02-15 – 2018-02-18 (×4): 40 mg via ORAL
  Filled 2018-02-14 (×4): qty 1

## 2018-02-14 MED ORDER — NITROGLYCERIN 0.4 MG SL SUBL
0.4000 mg | SUBLINGUAL_TABLET | SUBLINGUAL | Status: DC | PRN
  Administered 2018-02-19 – 2018-02-20 (×2): 0.4 mg via SUBLINGUAL
  Filled 2018-02-14 (×2): qty 1

## 2018-02-14 MED ORDER — PREDNISONE 20 MG PO TABS
40.0000 mg | ORAL_TABLET | Freq: Every day | ORAL | Status: DC
  Administered 2018-02-15: 40 mg via ORAL
  Filled 2018-02-14: qty 2

## 2018-02-14 NOTE — ED Provider Notes (Signed)
Emergency Department Provider Note   I have reviewed the triage vital signs and the nursing notes.   HISTORY  Chief Complaint Chest Pain   HPI Summer Wong is a 84 y.o. female with PMH of A-fib, CHF (EF 60-65%), COPD, HTN, GERD, HLD, DM, and SSS with [employer] Jude pacemaker since to the emergency department with worsening shortness of breath and chest pain.  Symptoms worsened this morning and have been present throughout the day.  Her chest pain she describes as a tightness in the center to left part of her chest.  She states that unlike the breathing symptoms her chest pain lasted for several minutes and then resolved.  She denies any productive cough, fever, or shaking chills.  No abdominal discomfort, vomiting, diarrhea.  She was discharged from the hospital on 10/18 with CHF exacerbation.  She is been compliant with her medications including Lasix and does not feel that fluid has reaccumulated. Son at bedside states that addition to the chest pain/shortness of breath symptoms the patient has been very weak with poor appetite since returning home.   Past Medical History:  Diagnosis Date  . Arthritis   . Asthma   . Atrial fibrillation ([place])    Status post cardioversion in January 2016  . Baker's cyst   . Cancer ([place])    Uterine  . CHF (congestive heart failure) ([place])   . Chronic diastolic heart failure ([place])   . COPD (chronic obstructive pulmonary disease) ([place])   . Essential hypertension   . GERD (gastroesophageal reflux disease)   . Heart murmur   . History of hepatitis A   . Hyperlipemia   . Macular degeneration   . OSA on CPAP   . Pacemaker 2007   [employer] - sick sinus syndrome  . Renal insufficiency   . Sick sinus syndrome ([place]) 2007  . Silent myocardial infarction ([place]) 2006  . Stress incontinence   . TB lung, latent   . Type 2 diabetes mellitus [facility])     Patient Active Problem List   Diagnosis Date Noted  . COPD exacerbation ([place]) 01/26/2018  . Reactive  airway disease   . CHF NYHA class I, acute on chronic, diastolic ([place]) 83/15/1761  . Coronary artery disease   . Acute on chronic diastolic CHF (congestive heart failure) ([place]) 11/02/2016  . Angina pectoris ([place]) 11/02/2016  . Uncontrolled type 2 diabetes mellitus with hyperglycemia ([place]) 11/02/2016  . Supratherapeutic INR   . CHF exacerbation ([place]) 02/27/2016  . Essential hypertension 02/27/2016  . Acute renal failure (ARF) ([place])   . Acute on chronic congestive heart failure ([place])   . Acute renal failure ([place])   . ARF (acute renal failure) ([place]) 10/16/2015  . CHF (congestive heart failure) ([place]) 10/15/2015  . CKD (chronic kidney disease) stage 3, GFR 30-59 ml/min (HCC) 10/15/2015  . Endometrial cancer ([place]) 08/16/2014  . FIGO stage II endometrial cancer ([place]) 07/11/2014  . Vulvovaginal candidiasis 07/11/2014  . Cutaneous candidiasis 07/11/2014  . Inactive tuberculosis of lung 03/08/2014  . Diabetes ([place]) 03/01/2014  . Arthritis, degenerative 03/01/2014  . Dyspnea 03/01/2014  . Atrial fibrillation ([place]) 10/13/2012  . Arteriosclerosis of coronary artery 10/13/2012  . BP (high blood pressure) 10/13/2012  . Obstructive apnea 10/13/2012  . Sick sinus syndrome ([place]) 10/13/2012    Past Surgical History:  Procedure Laterality Date  . BLADDER SUSPENSION    . CHOLECYSTECTOMY    . PACEMAKER INSERTION    . ROBOTIC ASSISTED TOTAL HYSTERECTOMY WITH BILATERAL SALPINGO  OOPHERECTOMY Bilateral 08/16/2014   Procedure:  ROBOTIC ASSISTED TOTAL HYSTERECTOMY WITH BILATERAL SALPINGO OOPHORECTOMY AND LYMPHADENECTOMY;  Surgeon: Everitt Amber, MD;  Location: [facility]S;  Service: Gynecology;  Laterality: Bilateral;  . TUBAL LIGATION      Allergies Enalapril maleate; Sulfa antibiotics; and Vasotec [enalaprilat]  Family History  Problem Relation Age of Onset  . Stomach cancer Father   . Colon cancer Brother     Social History Social History   Tobacco Use  . Smoking status: Never Smoker  . Smokeless  tobacco: Never Used  Substance Use Topics  . Alcohol use: No  . Drug use: No    Review of Systems  Constitutional: No fever/chills. Positive generalized weakness.  Eyes: No visual changes. ENT: No sore throat. Cardiovascular: Positive chest pain. Respiratory: Positive shortness of breath. Gastrointestinal: No abdominal pain. No nausea, no vomiting. No diarrhea. No constipation. Poor appetite.  Genitourinary: Negative for dysuria. Musculoskeletal: Negative for back pain. Skin: Negative for rash. Neurological: Negative for headaches, focal weakness or numbness.  10-point ROS otherwise negative.  ____________________________________________   PHYSICAL EXAM:  VITAL SIGNS: ED Triage Vitals [02/10/2018 1655]  Enc Vitals Group     BP (!) 130/56     Pulse Rate 74     Resp (!) 32     Temp 97.6 F (36.4 C)     Temp Source Oral     SpO2 99 %     Weight 176 lb 12.8 oz (80.2 kg)     Height 5\' 3"  (1.6 m)     Pain Score 0   Constitutional: Alert and oriented. Patient with some increased WOB.  Eyes: Conjunctivae are normal.  Head: Atraumatic. Nose: No congestion/rhinnorhea. Mouth/Throat: Mucous membranes are moist.  Oropharynx non-erythematous. Neck: No stridor.  Cardiovascular: A-fib. Good peripheral circulation. Grossly normal heart sounds.   Respiratory: Increased respiratory effort.  No retractions. Lungs with crackles at the bases and faint end-expiratory wheezing with forced expiration.  Gastrointestinal: Soft and nontender. No distention.  Musculoskeletal: No lower extremity tenderness with trace bilateral LE edema. No gross deformities of extremities. Neurologic:  Normal speech and language. No gross focal neurologic deficits are appreciated.  Skin:  Skin is warm, dry and intact. No rash noted.  ____________________________________________   LABS (all labs ordered are listed, but only abnormal results are displayed)  Labs Reviewed  BASIC METABOLIC PANEL - Abnormal;  Notable for the following components:      Result Value   Sodium 129 (*)    Chloride 96 (*)    Glucose, Bld 145 (*)    BUN 80 (*)    Creatinine, Ser 2.48 (*)    Calcium 8.7 (*)    GFR calc non Af Amer 16 (*)    GFR calc Af Amer 19 (*)    All other components within normal limits  CBC - Abnormal; Notable for the following components:   RBC 3.51 (*)    Hemoglobin 9.1 (*)    HCT 29.3 (*)    MCH 25.9 (*)    RDW 15.6 (*)    All other components within normal limits  BRAIN NATRIURETIC PEPTIDE - Abnormal; Notable for the following components:   B Natriuretic Peptide 274.0 (*)    All other components within normal limits  TROPONIN I  CBC   ____________________________________________  EKG   EKG Interpretation  Date/Time:  Monday February 14 2018 20:48:36 EDT Ventricular Rate:  75 PR Interval:    QRS Duration: 98 QT Interval:  410 QTC Calculation:  458 R Axis:   -18 Text Interpretation:  Atrial fibrillation Borderline left axis deviation Low voltage, extremity and precordial leads No STEMI.  Confirmed by Nanda Quinton [phone]) on 01/30/2018 10:48:51 PM       ____________________________________________  RADIOLOGY  Dg Chest 2 View  Result Date: 02/13/2018 CLINICAL DATA:  Shortness of breath and chest pain. Recent hospitalization for CHF. EXAM: CHEST - 2 VIEW COMPARISON:  Chest radiograph February 10, 2018 FINDINGS: Cardiac silhouette is normal size. Calcified aortic arch. Similar interstitial prominence without pleural effusion or pleural focal consolidation. Punctate LEFT lung granuloma. Dual lead LEFT cardiac pacemaker in situ. No pneumothorax. Soft tissue planes and included osseous structures are non suspicious. Surgical clips in the included abdomen compatible with cholecystectomy. IMPRESSION: Mild chronic interstitial changes. Aortic Atherosclerosis (ICD10-I70.0). Electronically Signed   By: Elon Alas M.D.   On: 02/22/2018 18:33     ____________________________________________   PROCEDURES  Procedure(s) performed:   Procedures  None ____________________________________________   INITIAL IMPRESSION / ASSESSMENT AND PLAN / ED COURSE  Pertinent labs & imaging results that were available during my care of the patient were reviewed by me and considered in my medical decision making (see chart for details).  She presents to the emergency department with chest pain and shortness of breath.  She was recently admitted with similar presentation found to have worsening congestive heart failure clinically.  Last echo is from July 2018 with EF of 60 to 65% and diffuse valvular disease. Patient not volume overloaded clinically. She has a likely multifactorial presentation with SOB. Differential is broadened with recent hospitalization. Patient with CP earlier in the day but none now.   Labs and CXR reviewed. No acute findings. Plan for admit for further COPD mgmt and enzyme trending.   Discussed patient's case with Hospitalist to request admission. Patient and family (if present) updated with plan. Care transferred to Hospitalist service.  I reviewed all nursing notes, vitals, pertinent old records, EKGs, labs, imaging (as available).  ____________________________________________  FINAL CLINICAL IMPRESSION(S) / ED DIAGNOSES  Final diagnoses:  Precordial chest pain  COPD exacerbation ([place])     MEDICATIONS GIVEN DURING THIS VISIT:  Medications  aspirin EC tablet 81 mg (has no administration in time range)  atorvastatin (LIPITOR) tablet 40 mg (has no administration in time range)  carvedilol (COREG) tablet 25 mg (has no administration in time range)  isosorbide mononitrate (IMDUR) 24 hr tablet 15 mg (has no administration in time range)  nitroGLYCERIN (NITROSTAT) SL tablet 0.4 mg (has no administration in time range)  insulin glargine (LANTUS) injection 10 Units (has no administration in time range)   pantoprazole (PROTONIX) EC tablet 40 mg (has no administration in time range)  oxybutynin (DITROPAN-XL) 24 hr tablet 5 mg (has no administration in time range)  ferrous sulfate tablet 325 mg (has no administration in time range)  cholecalciferol (VITAMIN D) tablet 1,000 Units (has no administration in time range)  omega-3 acid ethyl esters (LOVAZA) capsule 1 g (has no administration in time range)  loratadine (CLARITIN) tablet 10 mg (has no administration in time range)  ipratropium-albuterol (DUONEB) 0.5-2.5 (3) MG/3ML nebulizer solution 3 mL (has no administration in time range)  predniSONE (DELTASONE) tablet 40 mg (has no administration in time range)  dextromethorphan-guaiFENesin (MUCINEX DM) 30-600 MG per 12 hr tablet 1 tablet (has no administration in time range)  apixaban (ELIQUIS) tablet 2.5 mg (has no administration in time range)  ipratropium-albuterol (DUONEB) 0.5-2.5 (3) MG/3ML nebulizer solution 3 mL (3 mLs Nebulization Given  02/08/2018 1821)     Note:  This document was prepared using Dragon voice recognition software and may include unintentional dictation errors.  Nanda Quinton, MD Emergency Medicine    Long, [facility], MD 02/20/2018 2250

## 2018-02-14 NOTE — ED Triage Notes (Signed)
Pt reports she has recently been in the hospital off and on for CHF and chest pain with SOB.  Pt states she became very sob this morning and has been all day.  Respirations labored on arrival, pt pale and clammy.

## 2018-02-14 NOTE — Progress Notes (Signed)
ANTICOAGULATION CONSULT NOTE - Initial Consult  Pharmacy Consult for apixaban Indication: atrial fibrillation  Allergies  Allergen Reactions  . Enalapril Maleate Swelling    Throat swelling  . Sulfa Antibiotics Other (See Comments)    Does not remember.   Michail Sermon [Enalaprilat] Swelling    Tongue.     Patient Measurements: Height: 5\' 3"  (160 cm) Weight: 176 lb 12.8 oz (80.2 kg) IBW/kg (Calculated) : 52.4   Vital Signs: Temp: 97.6 F (36.4 C) (10/21 1927) Temp Source: Oral (10/21 1927) BP: 119/53 (10/21 2100) Pulse Rate: 65 (10/21 2100)  Labs: Recent Labs    02/19/2018 1737  HGB 9.1*  HCT 29.3*  PLT 219  CREATININE 2.48*  TROPONINI <0.03    Estimated Creatinine Clearance: 15.1 mL/min (A) (by C-G formula based on SCr of 2.48 mg/dL (H)).   Medical History: Past Medical History:  Diagnosis Date  . Arthritis   . Asthma   . Atrial fibrillation ([place])    Status post cardioversion in January 2016  . Baker's cyst   . Cancer ([place])    Uterine  . CHF (congestive heart failure) ([place])   . Chronic diastolic heart failure ([place])   . COPD (chronic obstructive pulmonary disease) ([place])   . Essential hypertension   . GERD (gastroesophageal reflux disease)   . Heart murmur   . History of hepatitis A   . Hyperlipemia   . Macular degeneration   . OSA on CPAP   . Pacemaker 2007   [employer] - sick sinus syndrome  . Renal insufficiency   . Sick sinus syndrome ([place]) 2007  . Silent myocardial infarction ([place]) 2006  . Stress incontinence   . TB lung, latent   . Type 2 diabetes mellitus (HCC)     Medications:   (Not in a hospital admission) Scheduled:  . apixaban  2.5 mg Oral BID  . [START ON 02/15/2018] aspirin EC  81 mg Oral q morning - 10a  . atorvastatin  40 mg Oral QHS  . carvedilol  25 mg Oral BID WC  . [START ON 02/15/2018] cholecalciferol  1,000 Units Oral q morning - 10a  . dextromethorphan-guaiFENesin  1 tablet Oral BID  . [START ON 02/15/2018]  ferrous sulfate  325 mg Oral Q breakfast  . insulin glargine  10 Units Subcutaneous QHS  . ipratropium-albuterol  3 mL Nebulization Q6H  . [START ON 02/15/2018] isosorbide mononitrate  15 mg Oral Daily  . [START ON 02/15/2018] loratadine  10 mg Oral Daily  . [START ON 02/15/2018] omega-3 acid ethyl esters  1 g Oral Daily  . oxybutynin  5 mg Oral BID  . [START ON 02/15/2018] pantoprazole  40 mg Oral Daily  . [START ON 02/15/2018] predniSONE  40 mg Oral Q breakfast   Infusions:   PRN: nitroGLYCERIN Anti-infectives (From admission, onward)   None      Assessment: 84 year old woman requiring apixaban anticoagulation for the treatment of afib.Baseline Hgb 9.1 plt 219 will need to monitor closely.  Goal of Therapy:  anticoagulation with apixaban Monitor platelets by anticoagulation protocol: Yes   Plan:  apixaban 2.5mg  po bid, follow labs closely for signs of bleeding,monitor for symptoms of bleeding   Summer Wong 01/28/2018,10:33 PM

## 2018-02-14 NOTE — H&P (Signed)
History and Physical    Summer Wong DPO:[id number] DOB: 1926/04/13 DOA: 02/07/2018  PCP: [facility] Patient coming from: Home  Chief Complaint: Shortness of breath, chest pain  HPI: Summer Wong is a 84 y.o. female with medical history significant of A. fib, chronic diastolic congestive heart failure, COPD, CKD, hypertension, hyperlipidemia, GERD, type II diabetes, sick sinus syndrome with [facility] Jude pacemaker presenting to the hospital for further evaluation of substernal chest tightness, shortness of breath, and wheezing which started early this morning when she woke up and was walking to the bathroom.  States her chest tightness resolved after she received breathing treatments in the ED.  She is currently chest pain-free.  Denies having any nausea or diaphoresis associated with the chest pain.  States she was recently admitted to the hospital and her breathing had improved after discharge but then became acutely worse this morning.  Reports having chronic orthopnea and chronic nonproductive cough; no recent change.  Denies having any lower extremity edema.  Denies any history of blood clots.  Denies having any calf pain, erythema, or swelling.  No other complaints.  Patient was discharged from the hospital on October 18 after being treated for an acute on chronic diastolic congestive heart failure exacerbation.  BNP 274, improved since recent hospital discharge.  ED Course: On arrival, tachypneic but remainder vital stable.  Satting well on room air.  Labs showing no leukocytosis.  I-STAT troponin negative.  EKG pending.  Chest x-ray showing mild chronic interstitial changes; no acute finding.  Patient received a DuoNeb treatment in the ED.  TRH paged to admit.  Review of Systems: As per HPI otherwise 10 point review of systems negative.  Past Medical History:  Diagnosis Date  . Arthritis   . Asthma   . Atrial fibrillation ([place])    Status post cardioversion in  January 2016  . Baker's cyst   . Cancer ([place])    Uterine  . CHF (congestive heart failure) ([place])   . Chronic diastolic heart failure ([place])   . COPD (chronic obstructive pulmonary disease) ([place])   . Essential hypertension   . GERD (gastroesophageal reflux disease)   . Heart murmur   . History of hepatitis A   . Hyperlipemia   . Macular degeneration   . OSA on CPAP   . Pacemaker 2007   [employer] - sick sinus syndrome  . Renal insufficiency   . Sick sinus syndrome ([place]) 2007  . Silent myocardial infarction ([place]) 2006  . Stress incontinence   . TB lung, latent   . Type 2 diabetes mellitus ([place])     Past Surgical History:  Procedure Laterality Date  . BLADDER SUSPENSION    . CHOLECYSTECTOMY    . PACEMAKER INSERTION    . ROBOTIC ASSISTED TOTAL HYSTERECTOMY WITH BILATERAL SALPINGO OOPHERECTOMY Bilateral 08/16/2014   Procedure:  ROBOTIC ASSISTED TOTAL HYSTERECTOMY WITH BILATERAL SALPINGO OOPHORECTOMY AND LYMPHADENECTOMY;  Surgeon: Everitt Amber, MD;  Location: [facility];  Service: Gynecology;  Laterality: Bilateral;  . TUBAL LIGATION       reports that she has never smoked. She has never used smokeless tobacco. She reports that she does not drink alcohol or use drugs.  Allergies  Allergen Reactions  . Enalapril Maleate Swelling    Throat swelling  . Sulfa Antibiotics Other (See Comments)    Does not remember.   Michail Sermon [Enalaprilat] Swelling    Tongue.     Family History  Problem Relation Age of  Onset  . Stomach cancer Father   . Colon cancer Brother     Prior to Admission medications   Medication Sig Start Date End Date Taking? Authorizing Provider  albuterol (PROVENTIL HFA;VENTOLIN HFA) 108 (90 BASE) MCG/ACT inhaler Inhale 1-2 puffs into the lungs every 6 (six) hours as needed for wheezing or shortness of breath.    [provider]  amLODipine (NORVASC) 10 MG tablet Take 1 tablet (10 mg total) by mouth every morning. Hold until follow up with PCP 02/11/18    Barton Dubois, MD  apixaban (ELIQUIS) 2.5 MG TABS tablet Take 2.5 mg by mouth 2 (two) times daily.  11/23/17 05/22/18  [provider]  aspirin EC 81 MG tablet Take 81 mg by mouth every morning.    [provider]  atorvastatin (LIPITOR) 40 MG tablet Take 40 mg by mouth at bedtime.    [provider]  carvedilol (COREG) 25 MG tablet Take 25 mg by mouth 2 (two) times daily with a meal.    [provider]  cetirizine (ZYRTEC) 10 MG tablet Take 10 mg by mouth every morning.     [provider]  cholecalciferol (VITAMIN D) 1000 UNITS tablet Take 1,000 Units by mouth every morning.     [provider]  ferrous sulfate 325 (65 FE) MG tablet Take 325 mg by mouth daily with breakfast.    [provider]  furosemide (LASIX) 20 MG tablet Take 1.5 tablets (30 mg total) by mouth 2 (two) times daily. 02/11/18   Barton Dubois, MD  insulin glargine (LANTUS) 100 UNIT/ML injection Inject 43-50 Units into the skin at bedtime.     [provider]  ipratropium (ATROVENT HFA) 17 MCG/ACT inhaler Inhale 1 puff into the lungs 3 (three) times daily. 02/11/18 02/11/19  Barton Dubois, MD  isosorbide mononitrate (IMDUR) 30 MG 24 hr tablet Take 0.5 tablets (15 mg total) by mouth daily. 11/07/16 02/10/18  Emokpae, Ejiroghene E, MD  losartan (COZAAR) 100 MG tablet Take 1 tablet (100 mg total) by mouth daily. Hold until follow up with PCP 02/11/18   Barton Dubois, MD  Misc Natural Products (LUTEIN VISION BLEND) CAPS Take 1 tablet by mouth 2 (two) times daily.    [provider]  nitroGLYCERIN (NITROSTAT) 0.4 MG SL tablet Place 0.4 mg under the tongue every 5 (five) minutes as needed for chest pain.    [provider]  Omega-3 Fatty Acids (FISH OIL) 1000 MG CAPS Take 1,000 mg by mouth daily.     [provider]  omeprazole (PRILOSEC) 20 MG capsule Take 20 mg by mouth every morning.     [provider]  oxybutynin  (DITROPAN-XL) 5 MG 24 hr tablet Take 5 mg by mouth 2 (two) times daily.     [provider]    Physical Exam: Vitals:   02/09/2018 2345 02/15/18 0000 02/15/18 0100 02/15/18 0145  BP:  (!) 99/58 (!) 117/55   Pulse: 73 73 70 67  Resp: 16 18 18 18   Temp:      TempSrc:      SpO2:  97% 98% 98%  Weight:      Height:       Physical Exam  Constitutional: She is oriented to person, place, and time. No distress.  Resting comfortably in a hospital stretcher.  HENT:  Head: Normocephalic and atraumatic.  Mouth/Throat: Oropharynx is clear and moist.  Eyes: Right eye exhibits no discharge. Left eye exhibits no discharge.  Neck: Neck supple.  No tracheal deviation present.  Cardiovascular: Normal rate, regular rhythm and intact distal pulses.  Murmur heard. Systolic ejection murmur appreciated  Pulmonary/Chest: Effort normal. She has no wheezes. She has no rales.  Noted to be coughing during exam.  Speaking clearly in full sentences.  Breathing comfortably on room air.  Abdominal: Soft. Bowel sounds are normal. She exhibits no distension. There is no tenderness.  Musculoskeletal: She exhibits no edema.  Neurological: She is alert and oriented to person, place, and time.  Skin: Skin is warm and dry. She is not diaphoretic.  Psychiatric: She has a normal mood and affect. Her behavior is normal.     Labs on Admission: I have personally reviewed following labs and imaging studies  CBC: Recent Labs  Lab 02/08/18 1020 02/10/18 1047 02/07/2018 1737  WBC 7.9 7.6 6.8  NEUTROABS 5.8 5.2  --   HGB 9.1* 9.6* 9.1*  HCT 30.0* 31.0* 29.3*  MCV 86.7 83.3 83.5  PLT 242 250 024   Basic Metabolic Panel: Recent Labs  Lab 02/08/18 1020 02/10/18 1047 02/11/18 0455 02/20/2018 1737  NA 134* 133* 136 129*  K 4.5 4.9 4.5 5.0  CL 105 101 103 96*  CO2 19* 19* 24 22  GLUCOSE 123* 130* 80 145*  BUN 66* 67* 66* 80*  CREATININE 2.37* 2.26* 2.28* 2.48*  CALCIUM 8.9 9.2 9.1 8.7*   GFR: Estimated  Creatinine Clearance: 15.1 mL/min (A) (by C-G formula based on SCr of 2.48 mg/dL (H)). Liver Function Tests: Recent Labs  Lab 02/08/18 1020  AST 27  ALT 12  ALKPHOS 112  BILITOT 0.7  PROT 7.1  ALBUMIN 3.7   No results for input(s): LIPASE, AMYLASE in the last 168 hours. No results for input(s): AMMONIA in the last 168 hours. Coagulation Profile: Recent Labs  Lab 02/10/18 1047  INR 1.54   Cardiac Enzymes: Recent Labs  Lab 02/08/18 1020 02/08/18 1425 02/10/18 1047 02/07/2018 1737  TROPONINI 0.03* 0.03* <0.03 <0.03   BNP (last 3 results) No results for input(s): PROBNP in the last 8760 hours. HbA1C: No results for input(s): HGBA1C in the last 72 hours. CBG: Recent Labs  Lab 02/08/18 1004 02/11/18 0733 02/11/18 0837 02/11/18 1154 02/11/18 1711  GLUCAP 120* 67* 115* 88 112*   Lipid Profile: No results for input(s): CHOL, HDL, LDLCALC, TRIG, CHOLHDL, LDLDIRECT in the last 72 hours. Thyroid Function Tests: No results for input(s): TSH, T4TOTAL, FREET4, T3FREE, THYROIDAB in the last 72 hours. Anemia Panel: No results for input(s): VITAMINB12, FOLATE, FERRITIN, TIBC, IRON, RETICCTPCT in the last 72 hours. Urine analysis:    Component Value Date/Time   COLORURINE YELLOW 02/10/2018 1046   APPEARANCEUR HAZY (A) 02/10/2018 1046   LABSPEC 1.006 02/10/2018 1046   PHURINE 5.0 02/10/2018 1046   GLUCOSEU NEGATIVE 02/10/2018 1046   HGBUR SMALL (A) 02/10/2018 1046   BILIRUBINUR NEGATIVE 02/10/2018 1046   KETONESUR NEGATIVE 02/10/2018 1046   PROTEINUR NEGATIVE 02/10/2018 1046   UROBILINOGEN 0.2 08/30/2014 1838   NITRITE NEGATIVE 02/10/2018 1046   LEUKOCYTESUR TRACE (A) 02/10/2018 1046    Radiological Exams on Admission: Dg Chest 2 View  Result Date: 01/29/2018 CLINICAL DATA:  Shortness of breath and chest pain. Recent hospitalization for CHF. EXAM: CHEST - 2 VIEW COMPARISON:  Chest radiograph February 10, 2018 FINDINGS: Cardiac silhouette is normal size. Calcified aortic  arch. Similar interstitial prominence without pleural effusion or pleural focal consolidation. Punctate LEFT lung granuloma. Dual lead LEFT cardiac pacemaker in situ. No pneumothorax. Soft tissue planes and included  osseous structures are non suspicious. Surgical clips in the included abdomen compatible with cholecystectomy. IMPRESSION: Mild chronic interstitial changes. Aortic Atherosclerosis (ICD10-I70.0). Electronically Signed   By: Elon Alas M.D.   On: 02/24/2018 18:33    EKG: Independently reviewed.  Atrial fibrillation (heart rate 75), low voltage.  Assessment/Plan Principal Problem:   Reactive airway disease with acute exacerbation Active Problems:   A-fib (HCC)   OSA on CPAP   Essential hypertension   Chronic anemia   CKD (chronic kidney disease) stage 4, GFR 15-29 ml/min (HCC)   Hyponatremia   Type 2 diabetes mellitus (HCC)   Chronic diastolic CHF (congestive heart failure) (HCC)   Acute exacerbation of reactive airway disease/ bronchitis  -Presenting with shortness of breath, wheezing, chest tightness, and cough.  Chest tightness resolved with nebulizer treatments in the ED.  I-STAT troponin negative and EKG not suggestive of ACS. -Satting well on room air.  Labs showing no leukocytosis. Chest x-ray showing mild chronic interstitial changes; no acute finding. -Duo nebs every 6 hours -Prednisone 40 mg daily -Mucinex DM -Supplemental oxygen if needed -COPD is listed in her chart but patient has never smoked cigarettes. Lungs do not appear hyperinflated on x-ray. No prior PFTs in chart. She will need PFTs as an outpatient.    Chronic anemia -Stable.  Hemoglobin 9.1, at baseline. Cont home iron supplement.   CKD 4 -Creatinine 2.4, slightly elevated since discharge when it was 2.2.  Hold home Lasix and losartan. -IV fluid overnight -Repeat BMP in a.m.  Mild hyponatremia -Corrected sodium 130.  She was hyponatremic during her recent hospitalization as well.  -Repeat  BMP in am  Hypertension -Currently blood pressure soft.  Hold home amlodipine, Lasix, and losartan at this time.  -IV fluid   A. Fib  -CHA2DS2VASc 7. Currently rate controlled. Cont home Eliquis and Coreg.   HLD -Cont home lipitor   DM2 -A1c 6.0 a month ago -Lantus 10 units qhs -SSI-S -CBG checks  Chronic diastolic congestive heart failure -Currently not volume overloaded.  BNP improved since her hospital discharge. -Hold home Lasix at this time in the setting of mild elevation in creatinine  OSA -CPAP at night   DVT prophylaxis: Eliquis Code Status: Patient wishes to be DNR.  Family Communication: No family at bedside.  Disposition Plan: Anticipate discharge to home in 1-2 days.  Consults called: None Admission status: Observation    Shela Leff MD [facility] Pager [phone]  If 7PM-7AM, please contact night-coverage [email or web address] Password [email or web address]  02/15/2018, 1:56 AM

## 2018-02-14 NOTE — ED Notes (Signed)
AC aware of meds needed.

## 2018-02-15 ENCOUNTER — Observation Stay (HOSPITAL_BASED_OUTPATIENT_CLINIC_OR_DEPARTMENT_OTHER): Payer: Medicare HMO

## 2018-02-15 DIAGNOSIS — G4733 Obstructive sleep apnea (adult) (pediatric): Secondary | ICD-10-CM

## 2018-02-15 DIAGNOSIS — E119 Type 2 diabetes mellitus without complications: Secondary | ICD-10-CM

## 2018-02-15 DIAGNOSIS — I5032 Chronic diastolic (congestive) heart failure: Secondary | ICD-10-CM

## 2018-02-15 DIAGNOSIS — Z9989 Dependence on other enabling machines and devices: Secondary | ICD-10-CM

## 2018-02-15 DIAGNOSIS — N184 Chronic kidney disease, stage 4 (severe): Secondary | ICD-10-CM

## 2018-02-15 DIAGNOSIS — D649 Anemia, unspecified: Secondary | ICD-10-CM

## 2018-02-15 DIAGNOSIS — J45901 Unspecified asthma with (acute) exacerbation: Secondary | ICD-10-CM | POA: Diagnosis not present

## 2018-02-15 DIAGNOSIS — E871 Hypo-osmolality and hyponatremia: Secondary | ICD-10-CM

## 2018-02-15 DIAGNOSIS — I480 Paroxysmal atrial fibrillation: Secondary | ICD-10-CM

## 2018-02-15 DIAGNOSIS — R0602 Shortness of breath: Secondary | ICD-10-CM

## 2018-02-15 LAB — CBC
HCT: 28 % — ABNORMAL LOW (ref 36.0–46.0)
HEMOGLOBIN: 8.6 g/dL — AB (ref 12.0–15.0)
MCH: 25.7 pg — AB (ref 26.0–34.0)
MCHC: 30.7 g/dL (ref 30.0–36.0)
MCV: 83.8 fL (ref 80.0–100.0)
PLATELETS: 215 10*3/uL (ref 150–400)
RBC: 3.34 MIL/uL — AB (ref 3.87–5.11)
RDW: 15.7 % — ABNORMAL HIGH (ref 11.5–15.5)
WBC: 5.3 10*3/uL (ref 4.0–10.5)
nRBC: 0 % (ref 0.0–0.2)

## 2018-02-15 LAB — GLUCOSE, CAPILLARY
GLUCOSE-CAPILLARY: 86 mg/dL (ref 70–99)
GLUCOSE-CAPILLARY: 184 mg/dL — AB (ref 70–99)
Glucose-Capillary: 163 mg/dL — ABNORMAL HIGH (ref 70–99)
Glucose-Capillary: 161 mg/dL — ABNORMAL HIGH (ref 70–99)

## 2018-02-15 LAB — BASIC METABOLIC PANEL
ANION GAP: 9 (ref 5–15)
BUN: 75 mg/dL — AB (ref 8–23)
CO2: 23 mmol/L (ref 22–32)
Calcium: 8.7 mg/dL — ABNORMAL LOW (ref 8.9–10.3)
Chloride: 102 mmol/L (ref 98–111)
Creatinine, Ser: 2.19 mg/dL — ABNORMAL HIGH (ref 0.44–1.00)
GFR calc Af Amer: 22 mL/min — ABNORMAL LOW (ref >60)
GFR, EST NON AFRICAN AMERICAN: 19 mL/min — AB (ref >60)
Glucose, Bld: 89 mg/dL (ref 70–99)
POTASSIUM: 4.3 mmol/L (ref 3.5–5.1)
SODIUM: 134 mmol/L — AB (ref 135–145)

## 2018-02-15 LAB — ECHOCARDIOGRAM COMPLETE
HEIGHTINCHES: 63 in
Weight: 2938.29 oz

## 2018-02-15 MED ORDER — ALBUTEROL SULFATE (2.5 MG/3ML) 0.083% IN NEBU: 2.5000 mg | INHALATION_SOLUTION | RESPIRATORY_TRACT | Status: DC | PRN

## 2018-02-15 MED ORDER — INSULIN ASPART 100 UNIT/ML ~~LOC~~ SOLN
0.0000 [IU] | Freq: Three times a day (TID) | SUBCUTANEOUS | Status: DC
  Administered 2018-02-15 (×2): 2 [IU] via SUBCUTANEOUS
  Administered 2018-02-16: 5 [IU] via SUBCUTANEOUS
  Administered 2018-02-16 (×2): 2 [IU] via SUBCUTANEOUS
  Administered 2018-02-17: 3 [IU] via SUBCUTANEOUS
  Administered 2018-02-17: 2 [IU] via SUBCUTANEOUS
  Administered 2018-02-17: 3 [IU] via SUBCUTANEOUS
  Administered 2018-02-18: 1 [IU] via SUBCUTANEOUS
  Administered 2018-02-18 (×2): 2 [IU] via SUBCUTANEOUS
  Administered 2018-02-19: 7 [IU] via SUBCUTANEOUS
  Administered 2018-02-19 (×2): 2 [IU] via SUBCUTANEOUS
  Administered 2018-02-20 (×2): 3 [IU] via SUBCUTANEOUS

## 2018-02-15 MED ORDER — SODIUM CHLORIDE 0.9 % IV SOLN
INTRAVENOUS | Status: AC
  Administered 2018-02-15 (×2): via INTRAVENOUS

## 2018-02-15 MED ORDER — METHYLPREDNISOLONE SODIUM SUCC 40 MG IJ SOLR
40.0000 mg | Freq: Four times a day (QID) | INTRAMUSCULAR | Status: DC
  Administered 2018-02-15 – 2018-02-16 (×4): 40 mg via INTRAVENOUS
  Filled 2018-02-15 (×5): qty 1

## 2018-02-15 MED ORDER — ALUM & MAG HYDROXIDE-SIMETH 200-200-20 MG/5ML PO SUSP
30.0000 mL | ORAL | Status: DC | PRN
  Administered 2018-02-15: 30 mL via ORAL
  Filled 2018-02-15: qty 30

## 2018-02-15 MED ORDER — IPRATROPIUM-ALBUTEROL 0.5-2.5 (3) MG/3ML IN SOLN
3.0000 mL | Freq: Three times a day (TID) | RESPIRATORY_TRACT | Status: DC
  Administered 2018-02-16 (×2): 3 mL via RESPIRATORY_TRACT
  Filled 2018-02-15 (×2): qty 3

## 2018-02-15 NOTE — Discharge Instructions (Signed)

## 2018-02-15 NOTE — Progress Notes (Signed)
Patient's home CPAP is setup and she said she will place on herself. RT will continue to monitor.

## 2018-02-15 NOTE — Progress Notes (Signed)
*  PRELIMINARY RESULTS* Echocardiogram 2D Echocardiogram has been performed.  Summer Wong 02/15/2018, 3:15 PM

## 2018-02-15 NOTE — Care Management Note (Signed)
Case Management Note  Patient Details  Name: Summer Wong MRN: [id number] Date of Birth: 1925-05-16  Subjective/Objective:     Admitted with CHF, reactive airway dx. Pt [name] last week home with self care. Pt was unable to get inhaler filled d/t cost of $400. Pt suddenly became SOB and came back in. She lives alone, has strong family support. CM discussed nursing for hospital follow up when [place]. Pt okay with this. Not feeling up to discussing [facility] provider at this time.               Action/Plan: CM will cont to follow, will need to choose [facility] provider at [place].   Expected Discharge Date:       02/17/18           Expected Discharge Plan:  [place]  In-House Referral:     Discharge planning Services     Post Acute Care Choice:    Choice offered to:     DME Arranged:    DME Agency:     HH Arranged:    [place] Agency:     Status of Service:  In process, will continue to follow  If discussed at Long Length of Stay Meetings, dates discussed:    Additional Comments:  Sherald Barge, RN 02/15/2018, 2:47 PM

## 2018-02-15 NOTE — Care Management Obs Status (Signed)
[place] NOTIFICATION   Patient Details  Name: Summer Wong MRN: [id number] Date of Birth: 10/10/1925   Medicare Observation Status Notification Given:  Yes    Sherald Barge, RN 02/15/2018, 12:19 PM

## 2018-02-15 NOTE — Progress Notes (Signed)
PROGRESS NOTE  Summer Wong NLG:[id number] DOB: 1925/12/23 DOA: 02/13/2018 PCP: [facility]  Brief Narrative: 84 year old woman PMH atrial fibrillation, chronic diastolic CHF, diabetes mellitus type 2 presented with chest pain, shortness of breath, wheezing, improved with bronchodilators in the emergency department.  Placed in observation for reactive airway disease with acute exacerbation  Assessment/Plan Reactive airway disease with acute exacerbation, bronchitis with shortness of breath and wheezing on admission.  Treated with bronchodilators, prednisone. --Never smoked cigarettes, lungs are not hyperinflated on chest x-ray, but chest x-ray suggest possible scarring.  Worked in further developing in past.  No PFTs in chart.  Needs outpatient pulmonology evaluation. --No hypoxia but significantly dyspneic with even just speaking --Continue bronchodilators, steroids --Check echocardiogram to rule out cardiac component  Chest pain --Resolved.  Suspect secondary to shortness of breath.  Troponin was negative on presentation.  No signs or symptoms to suggest ACS.  No further evaluation suggested at this point.  Anemia of chronic disease --Stable.  Chronic diastolic CHF.  Echocardiogram 2018 LVEF 60-65%.  Indeterminate diastolic function. --No evidence of gross volume overload but she does have rales on exam, whether this represents CHF or scarring is unclear.  Echocardiogram pending --Trial diuresis.  Atrial fibrillation --Stable.  Telemetry shows atrial fibrillation.  Continue Eliquis, Coreg  DM type 2, last hemoglobin A1c 6.0 --Blood sugars stable.  Continue Lantus, sliding scale insulin  CKD stage IV --Appears to be stable.  Follow-up as an outpatient.  PMH SSS s/p PM  OSA --Stable.  CPAP at night.  Hospitalized 10/17-10/18 for acute on chronic diastolic CHF, reactive airway disease, bronchitis  DVT prophylaxis: apixaban Code Status: DNR Family  Communication: none Disposition Plan: home    Murray Hodgkins, MD  [facility] Direct contact: [phone] --Via amion app OR  --[email or web address]; password [email or web address]  7PM-7AM contact night coverage as above 02/15/2018, 11:27 AM  LOS: 0 days   Consultants:    Procedures:    Antimicrobials:    Interval history/Subjective: Feels slightly better but not even close to baseline.  Still quite short of breath especially dyspneic on exertion.  Still wheezing and dry cough.  Objective: Vitals:  Vitals:   02/15/18 0545 02/15/18 0748  BP: (!) 101/51   Pulse: 67   Resp:    Temp: 97.8 F (36.6 C)   SpO2: 95% 98%    Exam:  Constitutional:  . Appears ill, uncomfortable but not toxic Eyes:  . pupils and irises appear normal . Normal lids  ENMT:  . grossly normal hearing  Respiratory:  . Bilateral inspiratory crackles right greater than left, with fair air movement, no rhonchi or wheezes . Moderate increased respiratory effort, speaks in short sentences. Cardiovascular:  . RRR, no m/r/g . No LE extremity edema   Psychiatric:  . Mental status o Mood, affect appropriate  I have personally reviewed the following:   Data: . BMP unremarkable, creatinine appears close to baseline. . Hemoglobin stable 8.6 . Chest x-ray independently reviewed, no acute disease  Scheduled Meds: . apixaban  2.5 mg Oral BID  . aspirin EC  81 mg Oral q morning - 10a  . atorvastatin  40 mg Oral QHS  . carvedilol  25 mg Oral BID WC  . cholecalciferol  1,000 Units Oral q morning - 10a  . dextromethorphan-guaiFENesin  1 tablet Oral BID  . ferrous sulfate  325 mg Oral Q breakfast  . insulin aspart  0-9 Units Subcutaneous TID WC  . insulin glargine  10 Units Subcutaneous  QHS  . ipratropium-albuterol  3 mL Nebulization Q6H  . isosorbide mononitrate  15 mg Oral Daily  . loratadine  10 mg Oral Daily  . methylPREDNISolone (SOLU-MEDROL) injection  40 mg Intravenous Q6H  . omega-3 acid ethyl  esters  1 g Oral Daily  . oxybutynin  5 mg Oral BID  . pantoprazole  40 mg Oral Daily   Continuous Infusions: . sodium chloride 125 mL/hr at 02/15/18 5800    Principal Problem:   Reactive airway disease with acute exacerbation Active Problems:   A-fib (HCC)   OSA on CPAP   Essential hypertension   Chronic anemia   CKD (chronic kidney disease) stage 4, GFR 15-29 ml/min (HCC)   Hyponatremia   Type 2 diabetes mellitus (HCC)   Chronic diastolic CHF (congestive heart failure) ([place])   LOS: 0 days

## 2018-02-16 DIAGNOSIS — J45901 Unspecified asthma with (acute) exacerbation: Secondary | ICD-10-CM | POA: Diagnosis not present

## 2018-02-16 LAB — GLUCOSE, CAPILLARY
GLUCOSE-CAPILLARY: 273 mg/dL — AB (ref 70–99)
GLUCOSE-CAPILLARY: 177 mg/dL — AB (ref 70–99)
Glucose-Capillary: 173 mg/dL — ABNORMAL HIGH (ref 70–99)
Glucose-Capillary: 240 mg/dL — ABNORMAL HIGH (ref 70–99)

## 2018-02-16 LAB — CBC
HCT: 29.5 % — ABNORMAL LOW (ref 36.0–46.0)
Hemoglobin: 8.8 g/dL — ABNORMAL LOW (ref 12.0–15.0)
MCH: 25.9 pg — AB (ref 26.0–34.0)
MCHC: 29.8 g/dL — AB (ref 30.0–36.0)
MCV: 86.8 fL (ref 80.0–100.0)
PLATELETS: 226 10*3/uL (ref 150–400)
RBC: 3.4 MIL/uL — ABNORMAL LOW (ref 3.87–5.11)
RDW: 15.3 % (ref 11.5–15.5)
WBC: 4.6 10*3/uL (ref 4.0–10.5)
nRBC: 0 % (ref 0.0–0.2)

## 2018-02-16 MED ORDER — METHYLPREDNISOLONE SODIUM SUCC 40 MG IJ SOLR
40.0000 mg | Freq: Two times a day (BID) | INTRAMUSCULAR | Status: DC
  Administered 2018-02-16 – 2018-02-20 (×8): 40 mg via INTRAVENOUS
  Filled 2018-02-16 (×8): qty 1

## 2018-02-16 MED ORDER — FUROSEMIDE 40 MG PO TABS
40.0000 mg | ORAL_TABLET | Freq: Every day | ORAL | Status: DC
  Administered 2018-02-16 – 2018-02-18 (×3): 40 mg via ORAL
  Filled 2018-02-16 (×3): qty 1

## 2018-02-16 NOTE — Progress Notes (Signed)
PROGRESS NOTE    Summer Wong  NWG:[id number] DOB: 07-29-1925 DOA: 02/12/2018 PCP: [facility]   Brief Narrative: Patient is a 84 year old female with past medical history of atrial fibrillation, chronic diastolic CHF, diabetes mellitus type 2 who presented to the emergency department with complaints of chest pain, shortness of breath, wheezing.  She was just discharged from here on October 18 after management for the same. she improved with bronchodilators in the emergency department.  She is currently managed for reactive airway disease with possible COPD.  Assessment & Plan:   Principal Problem:   Reactive airway disease with acute exacerbation Active Problems:   A-fib (HCC)   OSA on CPAP   Essential hypertension   Chronic anemia   CKD (chronic kidney disease) stage 4, GFR 15-29 ml/min (HCC)   Hyponatremia   Type 2 diabetes mellitus (HCC)   Chronic diastolic CHF (congestive heart failure) (HCC)  Reactive airway disease with acute exacerbation: Presented with shortness of breath and wheezing.  Responded very well with steroids.  Currently she was on Solu-Medrol 40 mg every 6 hours.  We will taper that to 40 mg every 12 hours.  We might change the steroids to oral tomorrow.  Patient has never smoked cigarettes.  Chest x-ray did not show any hyperinflation.  No history of COPD.  But chest x-ray suggested mild chronic interstitial changes.  She will benefit  further work-up as an outpatient by following up with pulmonology.  Patient feels better today.  She is off oxygen but does still feels mild shortness of breath on rest.  Continue steroids and bronchitis. Echocardiogram showed normal left ventricular size and function.  Chest pain: Resolved.  Most likely triggered with shortness of breath.  Troponin was negative on presentation.  No further work-up.  History of chronic diastolic CHF: Echocardiogram done yesterday ejection fraction of 63%, indeterminate  diastolic function.  She is on Lasix 30 mg BID at home which was held on admission.  Resume Lasix 40 mg daily here today. she follows with cardiology as an outpatient.  A. fib: Stable.  Continue Coreg and Eliquis for anticoagulation.S.P pacemaker  Diabetes type 2: Last hemoglobin A1c of 6.  Continue Lantus, sliding scale insulin.  CKD stage IV: Appears stable.Baseline creatinine around 2.  Follow-up as an outpatient.  OSA: Continue CPAP at night.  Hypertension: Currently blood pressure stable.  Continue current regimen.    DVT prophylaxis: Eliquis Code Status: DNR Family Communication: None present at the bedside  disposition Plan: Home tomorrow.  She had a bad experience because he had to come after being discharged this few days ago. Patient wants to stay for 1 more day   Consultants: None  Procedures:None  Antimicrobials:None  Subjective: Patient seen and examined the bedside this morning feels much better today.  Not dyspneic on during my evaluation.  Off oxygen.  Denies any chest pain.  Objective: Vitals:   02/15/18 1956 02/15/18 2119 02/16/18 0535 02/16/18 0749  BP:  (!) 105/48 (!) 119/59   Pulse:  77 95   Resp:  18 18   Temp:  (!) 97.5 F (36.4 C) 98.2 F (36.8 C)   TempSrc:  Oral Oral   SpO2: 94% 96% 97% 96%  Weight:      Height:       No intake or output data in the 24 hours ending 02/16/18 0940 Filed Weights   02/02/2018 1655 02/15/18 0545  Weight: 80.2 kg 83.3 kg    Examination:  General  exam: Appears calm and comfortable ,Not in distress,elderly female HEENT:PERRL,Oral mucosa moist, Ear/Nose normal on gross exam Respiratory system: Bilateral decreased air entry Cardiovascular system: S1 & S2 heard, RRR. No JVD, murmurs, rubs, gallops or clicks. No pedal edema. Gastrointestinal system: Abdomen is nondistended, soft and nontender. No organomegaly or masses felt. Normal bowel sounds heard. Central nervous system: Alert and oriented. No focal neurological  deficits. Extremities: No edema, no clubbing ,no cyanosis, distal peripheral pulses palpable. Skin: No rashes, lesions or ulcers,no icterus ,no pallor MSK: Normal muscle bulk,tone ,power Psychiatry: Judgement and insight appear normal. Mood & affect appropriate.     Data Reviewed: I have personally reviewed following labs and imaging studies  CBC: Recent Labs  Lab 02/10/18 1047 02/13/2018 1737 02/15/18 0608 02/16/18 0525  WBC 7.6 6.8 5.3 4.6  NEUTROABS 5.2  --   --   --   HGB 9.6* 9.1* 8.6* 8.8*  HCT 31.0* 29.3* 28.0* 29.5*  MCV 83.3 83.5 83.8 86.8  PLT 250 219 215 735   Basic Metabolic Panel: Recent Labs  Lab 02/10/18 1047 02/11/18 0455 02/02/2018 1737 02/15/18 0608  NA 133* 136 129* 134*  K 4.9 4.5 5.0 4.3  CL 101 103 96* 102  CO2 19* 24 22 23   GLUCOSE 130* 80 145* 89  BUN 67* 66* 80* 75*  CREATININE 2.26* 2.28* 2.48* 2.19*  CALCIUM 9.2 9.1 8.7* 8.7*   GFR: Estimated Creatinine Clearance: 17.5 mL/min (A) (by C-G formula based on SCr of 2.19 mg/dL (H)). Liver Function Tests: No results for input(s): AST, ALT, ALKPHOS, BILITOT, PROT, ALBUMIN in the last 168 hours. No results for input(s): LIPASE, AMYLASE in the last 168 hours. No results for input(s): AMMONIA in the last 168 hours. Coagulation Profile: Recent Labs  Lab 02/10/18 1047  INR 1.54   Cardiac Enzymes: Recent Labs  Lab 02/10/18 1047 02/11/2018 1737  TROPONINI <0.03 <0.03   BNP (last 3 results) No results for input(s): PROBNP in the last 8760 hours. HbA1C: No results for input(s): HGBA1C in the last 72 hours. CBG: Recent Labs  Lab 02/15/18 0731 02/15/18 1130 02/15/18 1620 02/15/18 2119 02/16/18 0712  GLUCAP 86 163* 161* 184* 173*   Lipid Profile: No results for input(s): CHOL, HDL, LDLCALC, TRIG, CHOLHDL, LDLDIRECT in the last 72 hours. Thyroid Function Tests: No results for input(s): TSH, T4TOTAL, FREET4, T3FREE, THYROIDAB in the last 72 hours. Anemia Panel: No results for input(s):  VITAMINB12, FOLATE, FERRITIN, TIBC, IRON, RETICCTPCT in the last 72 hours. Sepsis Labs: No results for input(s): PROCALCITON, LATICACIDVEN in the last 168 hours.  No results found for this or any previous visit (from the past 240 hour(s)).       Radiology Studies: Dg Chest 2 View  Result Date: 02/09/2018 CLINICAL DATA:  Shortness of breath and chest pain. Recent hospitalization for CHF. EXAM: CHEST - 2 VIEW COMPARISON:  Chest radiograph February 10, 2018 FINDINGS: Cardiac silhouette is normal size. Calcified aortic arch. Similar interstitial prominence without pleural effusion or pleural focal consolidation. Punctate LEFT lung granuloma. Dual lead LEFT cardiac pacemaker in situ. No pneumothorax. Soft tissue planes and included osseous structures are non suspicious. Surgical clips in the included abdomen compatible with cholecystectomy. IMPRESSION: Mild chronic interstitial changes. Aortic Atherosclerosis (ICD10-I70.0). Electronically Signed   By: Elon Alas M.D.   On: 02/21/2018 18:33        Scheduled Meds: . apixaban  2.5 mg Oral BID  . aspirin EC  81 mg Oral q morning - 10a  . atorvastatin  40 mg Oral QHS  . carvedilol  25 mg Oral BID WC  . cholecalciferol  1,000 Units Oral q morning - 10a  . dextromethorphan-guaiFENesin  1 tablet Oral BID  . ferrous sulfate  325 mg Oral Q breakfast  . insulin aspart  0-9 Units Subcutaneous TID WC  . insulin glargine  10 Units Subcutaneous QHS  . ipratropium-albuterol  3 mL Nebulization TID  . isosorbide mononitrate  15 mg Oral Daily  . loratadine  10 mg Oral Daily  . methylPREDNISolone (SOLU-MEDROL) injection  40 mg Intravenous Q12H  . omega-3 acid ethyl esters  1 g Oral Daily  . oxybutynin  5 mg Oral BID  . pantoprazole  40 mg Oral Daily   Continuous Infusions:   LOS: 0 days    Time spent: 35 mins.More than 50% of that time was spent in counseling and/or coordination of care.      Shelly Coss, MD [facility] Pager [phone]  If 7PM-7AM, please contact night-coverage [email or web address] Password [email or web address] 02/16/2018, 9:40 AM

## 2018-02-17 ENCOUNTER — Observation Stay (HOSPITAL_COMMUNITY): Payer: Medicare HMO

## 2018-02-17 DIAGNOSIS — I10 Essential (primary) hypertension: Secondary | ICD-10-CM

## 2018-02-17 DIAGNOSIS — I480 Paroxysmal atrial fibrillation: Secondary | ICD-10-CM | POA: Diagnosis not present

## 2018-02-17 DIAGNOSIS — E871 Hypo-osmolality and hyponatremia: Secondary | ICD-10-CM

## 2018-02-17 DIAGNOSIS — J45901 Unspecified asthma with (acute) exacerbation: Secondary | ICD-10-CM | POA: Diagnosis not present

## 2018-02-17 DIAGNOSIS — N184 Chronic kidney disease, stage 4 (severe): Secondary | ICD-10-CM | POA: Diagnosis not present

## 2018-02-17 DIAGNOSIS — I5032 Chronic diastolic (congestive) heart failure: Secondary | ICD-10-CM | POA: Diagnosis not present

## 2018-02-17 DIAGNOSIS — J441 Chronic obstructive pulmonary disease with (acute) exacerbation: Principal | ICD-10-CM

## 2018-02-17 LAB — CBC
HCT: 27.2 % — ABNORMAL LOW (ref 36.0–46.0)
HEMOGLOBIN: 8.4 g/dL — AB (ref 12.0–15.0)
MCH: 25.5 pg — AB (ref 26.0–34.0)
MCHC: 30.9 g/dL (ref 30.0–36.0)
MCV: 82.7 fL (ref 80.0–100.0)
Platelets: 240 10*3/uL (ref 150–400)
RBC: 3.29 MIL/uL — AB (ref 3.87–5.11)
RDW: 15.4 % (ref 11.5–15.5)
WBC: 7.6 10*3/uL (ref 4.0–10.5)
nRBC: 0 % (ref 0.0–0.2)

## 2018-02-17 LAB — GLUCOSE, CAPILLARY
GLUCOSE-CAPILLARY: 179 mg/dL — AB (ref 70–99)
GLUCOSE-CAPILLARY: 210 mg/dL — AB (ref 70–99)
GLUCOSE-CAPILLARY: 144 mg/dL — AB (ref 70–99)
Glucose-Capillary: 204 mg/dL — ABNORMAL HIGH (ref 70–99)

## 2018-02-17 LAB — BASIC METABOLIC PANEL
Anion gap: 10 (ref 5–15)
BUN: 59 mg/dL — AB (ref 8–23)
CHLORIDE: 100 mmol/L (ref 98–111)
CO2: 21 mmol/L — ABNORMAL LOW (ref 22–32)
Calcium: 8.9 mg/dL (ref 8.9–10.3)
Creatinine, Ser: 1.75 mg/dL — ABNORMAL HIGH (ref 0.44–1.00)
GFR calc Af Amer: 28 mL/min — ABNORMAL LOW (ref >60)
GFR calc non Af Amer: 24 mL/min — ABNORMAL LOW (ref >60)
GLUCOSE: 183 mg/dL — AB (ref 70–99)
POTASSIUM: 4.9 mmol/L (ref 3.5–5.1)
SODIUM: 131 mmol/L — AB (ref 135–145)

## 2018-02-17 LAB — TROPONIN I
TROPONIN I: 0.03 ng/mL — AB (ref <0.03)
Troponin I: <0.03 ng/mL (ref <0.03)

## 2018-02-17 MED ORDER — SODIUM CHLORIDE 0.9 % IV BOLUS
500.0000 mL | Freq: Once | INTRAVENOUS | Status: AC
  Administered 2018-02-17: 500 mL via INTRAVENOUS

## 2018-02-17 MED ORDER — ACETAMINOPHEN 325 MG PO TABS
650.0000 mg | ORAL_TABLET | Freq: Four times a day (QID) | ORAL | Status: DC | PRN
  Administered 2018-02-17: 650 mg via ORAL
  Filled 2018-02-17: qty 2

## 2018-02-17 MED ORDER — ONDANSETRON HCL 4 MG/2ML IJ SOLN
4.0000 mg | Freq: Three times a day (TID) | INTRAMUSCULAR | Status: DC | PRN
  Administered 2018-02-17 – 2018-02-21 (×3): 4 mg via INTRAVENOUS
  Filled 2018-02-17 (×4): qty 2

## 2018-02-17 MED ORDER — BUDESONIDE 0.25 MG/2ML IN SUSP
0.2500 mg | Freq: Two times a day (BID) | RESPIRATORY_TRACT | Status: DC
  Administered 2018-02-17 – 2018-02-20 (×6): 0.25 mg via RESPIRATORY_TRACT
  Filled 2018-02-17 (×7): qty 2

## 2018-02-17 MED ORDER — IPRATROPIUM-ALBUTEROL 0.5-2.5 (3) MG/3ML IN SOLN
3.0000 mL | Freq: Four times a day (QID) | RESPIRATORY_TRACT | Status: DC
  Administered 2018-02-17 – 2018-02-19 (×5): 3 mL via RESPIRATORY_TRACT
  Filled 2018-02-17 (×5): qty 3

## 2018-02-17 NOTE — Progress Notes (Signed)
PROGRESS NOTE    Summer Wong  AOZ:[id number] DOB: 1925/08/12 DOA: 01/25/2018 PCP: [facility]   Brief Narrative: Patient is a 84 year old female with past medical history of atrial fibrillation, chronic diastolic CHF, diabetes mellitus type 2 who presented to the emergency department with complaints of chest pain, shortness of breath, wheezing.  She was just discharged from here on October 18 after management for the same. she improved with bronchodilators in the emergency department.  She is currently managed for reactive airway disease with possible COPD.  Assessment & Plan:   Principal Problem:   Reactive airway disease with acute exacerbation Active Problems:   A-fib (HCC)   OSA on CPAP   Essential hypertension   Chronic anemia   CKD (chronic kidney disease) stage 4, GFR 15-29 ml/min (HCC)   Hyponatremia   Type 2 diabetes mellitus (HCC)   Chronic diastolic CHF (congestive heart failure) (HCC)  Reactive airway disease with acute exacerbation: Presented with shortness of breath and wheezing.  Responded very well with steroids.  She is currently on Solu-Medrol. Patient has never smoked cigarettes.  Chest x-ray did not show any hyperinflation.  No history of COPD.  But chest x-ray suggested mild chronic interstitial changes.  She will benefit  further work-up as an outpatient by following up with pulmonology.  She continues to feel short of breath.  We will add inhaled steroids.  Repeat chest x-ray today.  Echocardiogram showed normal left ventricular size and function.  Chest pain: Resolved.  Most likely triggered with shortness of breath.  Troponin was negative on presentation.  No further work-up.  History of chronic diastolic CHF: Echocardiogram done yesterday ejection fraction of 63%, indeterminate diastolic function.  She is on Lasix 30 mg BID at home which was held on admission.  Lasix was resumed at 40 mg daily in the hospital. she follows with  cardiology as an outpatient.  A. fib: Stable.  Continue Coreg and Eliquis for anticoagulation.S.P pacemaker  Diabetes type 2: Last hemoglobin A1c of 6.  Continue Lantus, sliding scale insulin.  CKD stage IV: Appears stable.Baseline creatinine around 2.  Follow-up as an outpatient.  Creatinine has trended down since admission.  OSA: Continue CPAP at night.  Hypertension: Since blood pressure was low today, she will receive a fluid bolus and will hold p.m. dose of Coreg.  We will also check EKG and cardiac enzymes.    DVT prophylaxis: Eliquis Code Status: DNR Family Communication: None present at the bedside  disposition Plan: Anticipate discharge home in 24 hours if her respiratory status improved and she does not have any further episodes of dizziness/hypotensive.   Consultants: None  Procedures:None  Antimicrobials:None  Subjective: Patient reports that she still feels mildly short of breath.  On ambulation, she became dyspneic and had increased work of breathing.  Saturations stayed stable.  Today, while walking to the commode, she became acutely lightheaded and hypotensive with a blood pressure in the 60s.  Rapid response was called.  On follow-up blood pressure check, it was noted to be in the 121/66.  She still feels tired.  No chest pain.  Still feels short of breath, but no different than earlier.  Objective: Vitals:   02/17/18 0516 02/17/18 0854 02/17/18 1400 02/17/18 1628  BP: (!) 119/55  123/67 (!) 69/39  Pulse: 81  82 65  Resp: 19  (!) 22   Temp: 98.1 F (36.7 C)     TempSrc: Oral     SpO2: 98% 97% 95%  93%  Weight:      Height:        Intake/Output Summary (Last 24 hours) at 02/17/2018 1649 Last data filed at 02/17/2018 1200 Gross per 24 hour  Intake 720 ml  Output -  Net 720 ml   Filed Weights   02/19/2018 1655 02/15/18 0545  Weight: 80.2 kg 83.3 kg    Examination:  General exam: Appears calm and comfortable ,Not in distress,elderly  female HEENT:PERRL,Oral mucosa moist, Ear/Nose normal on gross exam Respiratory system: Bilateral coarse breath sounds, mildly increased work of breathing during conversation Cardiovascular system: S1 & S2 heard, RRR. No JVD, murmurs, rubs, gallops or clicks. No pedal edema. Gastrointestinal system: Abdomen is nondistended, soft and nontender. No organomegaly or masses felt. Normal bowel sounds heard. Central nervous system: Alert and oriented. No focal neurological deficits. Extremities: No edema, no clubbing ,no cyanosis, distal peripheral pulses palpable. Skin: No rashes, lesions or ulcers,no icterus ,no pallor MSK: Normal muscle bulk,tone ,power Psychiatry: Judgement and insight appear normal. Mood & affect appropriate.     Data Reviewed: I have personally reviewed following labs and imaging studies  CBC: Recent Labs  Lab 02/07/2018 1737 02/15/18 0608 02/16/18 0525 02/17/18 0507  WBC 6.8 5.3 4.6 7.6  HGB 9.1* 8.6* 8.8* 8.4*  HCT 29.3* 28.0* 29.5* 27.2*  MCV 83.5 83.8 86.8 82.7  PLT 219 215 226 810   Basic Metabolic Panel: Recent Labs  Lab 02/11/18 0455 02/20/2018 1737 02/15/18 0608 02/17/18 0507  NA 136 129* 134* 131*  K 4.5 5.0 4.3 4.9  CL 103 96* 102 100  CO2 24 22 23  21*  GLUCOSE 80 145* 89 183*  BUN 66* 80* 75* 59*  CREATININE 2.28* 2.48* 2.19* 1.75*  CALCIUM 9.1 8.7* 8.7* 8.9   GFR: Estimated Creatinine Clearance: 21.9 mL/min (A) (by C-G formula based on SCr of 1.75 mg/dL (H)). Liver Function Tests: No results for input(s): AST, ALT, ALKPHOS, BILITOT, PROT, ALBUMIN in the last 168 hours. No results for input(s): LIPASE, AMYLASE in the last 168 hours. No results for input(s): AMMONIA in the last 168 hours. Coagulation Profile: No results for input(s): INR, PROTIME in the last 168 hours. Cardiac Enzymes: Recent Labs  Lab 02/01/2018 1737  TROPONINI <0.03   BNP (last 3 results) No results for input(s): PROBNP in the last 8760 hours. HbA1C: No results for  input(s): HGBA1C in the last 72 hours. CBG: Recent Labs  Lab 02/16/18 1620 02/16/18 2121 02/17/18 0725 02/17/18 1110 02/17/18 1631  GLUCAP 177* 240* 179* 210* 204*   Lipid Profile: No results for input(s): CHOL, HDL, LDLCALC, TRIG, CHOLHDL, LDLDIRECT in the last 72 hours. Thyroid Function Tests: No results for input(s): TSH, T4TOTAL, FREET4, T3FREE, THYROIDAB in the last 72 hours. Anemia Panel: No results for input(s): VITAMINB12, FOLATE, FERRITIN, TIBC, IRON, RETICCTPCT in the last 72 hours. Sepsis Labs: No results for input(s): PROCALCITON, LATICACIDVEN in the last 168 hours.  No results found for this or any previous visit (from the past 240 hour(s)).       Radiology Studies: No results found.      Scheduled Meds: . apixaban  2.5 mg Oral BID  . aspirin EC  81 mg Oral q morning - 10a  . atorvastatin  40 mg Oral QHS  . budesonide (PULMICORT) nebulizer solution  0.25 mg Nebulization BID  . carvedilol  25 mg Oral BID WC  . cholecalciferol  1,000 Units Oral q morning - 10a  . dextromethorphan-guaiFENesin  1 tablet Oral BID  .  ferrous sulfate  325 mg Oral Q breakfast  . furosemide  40 mg Oral Daily  . insulin aspart  0-9 Units Subcutaneous TID WC  . insulin glargine  10 Units Subcutaneous QHS  . ipratropium-albuterol  3 mL Nebulization Q6H  . isosorbide mononitrate  15 mg Oral Daily  . loratadine  10 mg Oral Daily  . methylPREDNISolone (SOLU-MEDROL) injection  40 mg Intravenous Q12H  . omega-3 acid ethyl esters  1 g Oral Daily  . oxybutynin  5 mg Oral BID  . pantoprazole  40 mg Oral Daily   Continuous Infusions: . sodium chloride       LOS: 0 days    Time spent: 36mins. More than 50% of that time was spent in assessing patient, providing care at the bedside      Kathie Dike, MD [facility] Pager [phone]  If 7PM-7AM, please contact night-coverage [email or web address] Password [email or web address] 02/17/2018, 4:49 PM

## 2018-02-17 NOTE — Care Management (Signed)
Per [employer]: In [place]:   Altrovent inhaler -not covered, Spiriva-$112.00. Albuterol/inotroprium $2.98  (duo neb) Nebuelizer  the machine around$35.00 more or less.

## 2018-02-17 NOTE — Progress Notes (Signed)
Pt placed on home CPAP... Vital within normal range. No o2 bleed in at this time. RT will continue to monitor through the night. O2 and connector at bedside if needed. Patient resting comfortable and without distress. RN informed

## 2018-02-17 NOTE — Progress Notes (Signed)
Pt was ambulating to the bathroom with NT and stated she felt like she was going to pass out. Pt made it to the toilet and BP checked, 64/39 automatically and manually. Pt was not as alert and oriented as earlier in shift, rapid response called. Pt did have a BM while on the toilet. BP came back up to 117/72, transported patient back to bed. MD saw patient and put in several orders, including vital sign checks Q30 minutes for 2 hours. Will continue to monitor and alert MD as necessary.

## 2018-02-17 NOTE — Care Management Note (Signed)
Case Management Note  Patient Details  Name: Summer Wong MRN: [id number] Date of Birth: 1925/09/27  CM provided list of provider options and pt has chosen [facility]. Pt aware HH has 48 hrs to make first visit. Vaughan Basta, [facility] rep, given referral.   CM requested benefits check and dicussed results with patient. She is unable to afford $112 for Spiriva inhaler, requests duo-neb. CM discussed with MD who will order at DC.   Pt still SOB, will ambulate with staff today and DC if stable vs tomorrow.    Expected Discharge Date:     02/17/18             Expected Discharge Plan:  [place]  In-House Referral:  NA  Discharge planning Services  CM Consult  Post Acute Care Choice:  Home Health Choice offered to:  Patient  HH Arranged:  RN [facility] Agency:  [place]  Status of Service:  Completed, signed off  If discussed at [place] of Stay Meetings, dates discussed:    Additional Comments:  Sherald Barge, RN 02/17/2018, 2:23 PM

## 2018-02-17 NOTE — Progress Notes (Signed)
Pt ambulated approximately 50 feet in the hallway. Tolerated fair. O2 level stayed in the range of 94-96 % on room air. Pt was physically short of breath, more so than she was at home per patient report. Will continue to monitor. MD made aware.

## 2018-02-18 DIAGNOSIS — J45901 Unspecified asthma with (acute) exacerbation: Secondary | ICD-10-CM | POA: Diagnosis present

## 2018-02-18 DIAGNOSIS — R072 Precordial pain: Secondary | ICD-10-CM | POA: Diagnosis present

## 2018-02-18 DIAGNOSIS — J9601 Acute respiratory failure with hypoxia: Secondary | ICD-10-CM | POA: Diagnosis not present

## 2018-02-18 DIAGNOSIS — N184 Chronic kidney disease, stage 4 (severe): Secondary | ICD-10-CM | POA: Diagnosis present

## 2018-02-18 DIAGNOSIS — J441 Chronic obstructive pulmonary disease with (acute) exacerbation: Secondary | ICD-10-CM | POA: Diagnosis present

## 2018-02-18 DIAGNOSIS — C799 Secondary malignant neoplasm of unspecified site: Secondary | ICD-10-CM | POA: Diagnosis not present

## 2018-02-18 DIAGNOSIS — E1122 Type 2 diabetes mellitus with diabetic chronic kidney disease: Secondary | ICD-10-CM | POA: Diagnosis present

## 2018-02-18 DIAGNOSIS — I5033 Acute on chronic diastolic (congestive) heart failure: Secondary | ICD-10-CM | POA: Diagnosis not present

## 2018-02-18 DIAGNOSIS — Z6832 Body mass index (BMI) 32.0-32.9, adult: Secondary | ICD-10-CM | POA: Diagnosis not present

## 2018-02-18 DIAGNOSIS — I130 Hypertensive heart and chronic kidney disease with heart failure and stage 1 through stage 4 chronic kidney disease, or unspecified chronic kidney disease: Secondary | ICD-10-CM | POA: Diagnosis present

## 2018-02-18 DIAGNOSIS — G893 Neoplasm related pain (acute) (chronic): Secondary | ICD-10-CM | POA: Diagnosis present

## 2018-02-18 DIAGNOSIS — Z66 Do not resuscitate: Secondary | ICD-10-CM | POA: Diagnosis present

## 2018-02-18 DIAGNOSIS — I959 Hypotension, unspecified: Secondary | ICD-10-CM | POA: Diagnosis not present

## 2018-02-18 DIAGNOSIS — D631 Anemia in chronic kidney disease: Secondary | ICD-10-CM | POA: Diagnosis present

## 2018-02-18 DIAGNOSIS — I482 Chronic atrial fibrillation, unspecified: Secondary | ICD-10-CM | POA: Diagnosis present

## 2018-02-18 DIAGNOSIS — Y92230 Patient room in hospital as the place of occurrence of the external cause: Secondary | ICD-10-CM | POA: Diagnosis not present

## 2018-02-18 DIAGNOSIS — C7989 Secondary malignant neoplasm of other specified sites: Secondary | ICD-10-CM | POA: Diagnosis present

## 2018-02-18 DIAGNOSIS — Z515 Encounter for palliative care: Secondary | ICD-10-CM | POA: Diagnosis not present

## 2018-02-18 DIAGNOSIS — E871 Hypo-osmolality and hyponatremia: Secondary | ICD-10-CM | POA: Diagnosis present

## 2018-02-18 DIAGNOSIS — C7889 Secondary malignant neoplasm of other digestive organs: Secondary | ICD-10-CM | POA: Diagnosis present

## 2018-02-18 DIAGNOSIS — J4541 Moderate persistent asthma with (acute) exacerbation: Secondary | ICD-10-CM | POA: Diagnosis not present

## 2018-02-18 LAB — CBC
HEMATOCRIT: 23 % — AB (ref 36.0–46.0)
HEMOGLOBIN: 7.2 g/dL — AB (ref 12.0–15.0)
MCH: 26.5 pg (ref 26.0–34.0)
MCHC: 31.3 g/dL (ref 30.0–36.0)
MCV: 84.6 fL (ref 80.0–100.0)
Platelets: 223 10*3/uL (ref 150–400)
RBC: 2.72 MIL/uL — ABNORMAL LOW (ref 3.87–5.11)
RDW: 15.2 % (ref 11.5–15.5)
WBC: 10.3 10*3/uL (ref 4.0–10.5)
nRBC: 0 % (ref 0.0–0.2)

## 2018-02-18 LAB — GLUCOSE, CAPILLARY
GLUCOSE-CAPILLARY: 157 mg/dL — AB (ref 70–99)
GLUCOSE-CAPILLARY: 224 mg/dL — AB (ref 70–99)
Glucose-Capillary: 137 mg/dL — ABNORMAL HIGH (ref 70–99)
Glucose-Capillary: 144 mg/dL — ABNORMAL HIGH (ref 70–99)

## 2018-02-18 LAB — ABO/RH: ABO/RH(D): O POS

## 2018-02-18 LAB — HEMOGLOBIN AND HEMATOCRIT, BLOOD
HCT: 25.5 % — ABNORMAL LOW (ref 36.0–46.0)
Hemoglobin: 8 g/dL — ABNORMAL LOW (ref 12.0–15.0)

## 2018-02-18 LAB — TROPONIN I: Troponin I: <0.03 ng/mL (ref <0.03)

## 2018-02-18 LAB — BASIC METABOLIC PANEL
ANION GAP: 11 (ref 5–15)
BUN: 58 mg/dL — ABNORMAL HIGH (ref 8–23)
CHLORIDE: 100 mmol/L (ref 98–111)
CO2: 20 mmol/L — AB (ref 22–32)
Calcium: 8.7 mg/dL — ABNORMAL LOW (ref 8.9–10.3)
Creatinine, Ser: 1.83 mg/dL — ABNORMAL HIGH (ref 0.44–1.00)
GFR calc Af Amer: 27 mL/min — ABNORMAL LOW (ref >60)
GFR calc non Af Amer: 23 mL/min — ABNORMAL LOW (ref >60)
Glucose, Bld: 139 mg/dL — ABNORMAL HIGH (ref 70–99)
Potassium: 5 mmol/L (ref 3.5–5.1)
Sodium: 131 mmol/L — ABNORMAL LOW (ref 135–145)

## 2018-02-18 LAB — PREPARE RBC (CROSSMATCH)

## 2018-02-18 MED ORDER — PANTOPRAZOLE SODIUM 40 MG PO TBEC
40.0000 mg | DELAYED_RELEASE_TABLET | Freq: Two times a day (BID) | ORAL | Status: DC
  Administered 2018-02-18 – 2018-02-19 (×3): 40 mg via ORAL
  Filled 2018-02-18 (×4): qty 1

## 2018-02-18 MED ORDER — SODIUM CHLORIDE 0.9% IV SOLUTION
Freq: Once | INTRAVENOUS | Status: AC
  Administered 2018-02-18: 11:00:00 via INTRAVENOUS

## 2018-02-18 MED ORDER — FUROSEMIDE 10 MG/ML IJ SOLN
60.0000 mg | Freq: Once | INTRAMUSCULAR | Status: AC
  Administered 2018-02-18: 60 mg via INTRAVENOUS
  Filled 2018-02-18: qty 6

## 2018-02-18 NOTE — Progress Notes (Signed)
PROGRESS NOTE    Summer Wong  TFT:[id number] DOB: [birth year] DOA: 02/16/2018 PCP: [facility]   Brief Narrative:  Patient is a 84 year old female with past medical history of atrial fibrillation, chronic diastolic CHF, diabetes mellitus type 2 who presented to the emergency department with complaints of chest pain, shortness of breath, wheezing.  She was just discharged from here on October 18 after management for the same. she improved with bronchodilators in the emergency department.  She is currently managed for reactive airway disease with possible COPD. She is also having worsening anemia for which she is being evaluated for GI bleed and will receive a PRBC transfusion. Additionally, she is volume overloaded and will require further diuresis.  Assessment & Plan:   Principal Problem:   Reactive airway disease with acute exacerbation Active Problems:   A-fib (HCC)   OSA on CPAP   Essential hypertension   Chronic anemia   CKD (chronic kidney disease) stage 4, GFR 15-29 ml/min (HCC)   Hyponatremia   Type 2 diabetes mellitus (HCC)   Chronic diastolic CHF (congestive heart failure) (HCC)  Reactive airway disease with acute exacerbation: Presented with shortness of breath and wheezing.  Responded very well with steroids.  She is currently on Solu-Medrol. Patient has never smoked cigarettes.  Chest x-ray did not show any hyperinflation.  No history of COPD.  But chest x-ray suggested mild chronic interstitial changes.  She will benefit  further work-up as an outpatient by following up with pulmonology.  She continues to feel short of breath; likely secondary to volume overload based on chest x-ray performed on 10/24. Echocardiogram showed normal left ventricular size and function. -Plan to increase diuresis with Lasix 60 mg to be added this afternoon and monitor I's and O's and daily weights.  Continue IV Solu-Medrol and inhaled steroids as well as breathing  treatments.  Presyncope in the setting of worsening anemia: We will plan for PRBC 1 unit transfusion today along with FOBT.  Protonix to twice daily.  Eliquis has been withheld and switch to SCDs.  We will continue to monitor with repeat CBC in a.m. and consider GI consultation as needed.  Chest pain: Resolved.  Most likely triggered with shortness of breath.  Troponin was negative on presentation.  No further work-up.  History of chronic diastolic CHF: Echocardiogram done yesterday ejection fraction of 63%, indeterminate diastolic function.  She is on Lasix 30 mg BID at home which was held on admission.    We will plan to diurese further with IV Lasix today especially with noted plan for PRBC transfusion.  A. fib: Stable.  Continue Coreg and hold Eliquis for anticoagulation.S.P pacemaker  Diabetes type 2: Last hemoglobin A1c of 6.  Continue Lantus, sliding scale insulin.  CKD stage IV: Appears stable.Baseline creatinine around 2->1.83 today.  Follow-up as an outpatient.  Creatinine has trended down since admission.  OSA: Continue CPAP at night.  Hypertension: Improved with fluid bolus and no further findings on EKG and cardiac enzymes. Resume Coreg once this further improves. Will monitor with aggressive diuresis.    DVT prophylaxis: Eliquis now to SCDs Code Status: DNR Family Communication: Spoke with Son at bedside Disposition Plan:  Transfusion and evaluation for possible GI bleed.  Diuresis due to volume overload.   Consultants: None  Procedures:None  Antimicrobials:None  Subjective: Patient seen and evaluated today with ongoing dyspnea noted. She has not had a BM today as of yet. She is up and having breakfast.  Objective: Vitals:  02/18/18 0008 02/18/18 0550 02/18/18 0748 02/18/18 0828  BP:  (!) 108/51  (!) 117/48  Pulse: 83 82  99  Resp:      Temp:  97.7 F (36.5 C)    TempSrc:  Oral    SpO2: 96% 97% 97% 97%  Weight:      Height:         Intake/Output Summary (Last 24 hours) at 02/18/2018 0939 Last data filed at 02/18/2018 0830 Gross per 24 hour  Intake 1092.88 ml  Output 300 ml  Net 792.88 ml   Filed Weights   02/08/2018 1655 02/15/18 0545  Weight: 80.2 kg 83.3 kg    Examination:  General exam: Appears calm and comfortable  Respiratory system: Clear to auscultation. Respiratory effort normal. Currently on [place]. Cardiovascular system: S1 & S2 heard, RRR. No JVD, murmurs, rubs, gallops or clicks. No pedal edema. Gastrointestinal system: Abdomen is nondistended, soft and nontender. No organomegaly or masses felt. Normal bowel sounds heard. Central nervous system: Alert and oriented. No focal neurological deficits. Extremities: Symmetric 5 x 5 power. Skin: No rashes, lesions or ulcers Psychiatry: Judgement and insight appear normal. Mood & affect appropriate.     Data Reviewed: I have personally reviewed following labs and imaging studies  CBC: Recent Labs  Lab 02/22/2018 1737 02/15/18 0608 02/16/18 0525 02/17/18 0507 02/18/18 0438  WBC 6.8 5.3 4.6 7.6 10.3  HGB 9.1* 8.6* 8.8* 8.4* 7.2*  HCT 29.3* 28.0* 29.5* 27.2* 23.0*  MCV 83.5 83.8 86.8 82.7 84.6  PLT 219 215 226 240 702   Basic Metabolic Panel: Recent Labs  Lab 01/26/2018 1737 02/15/18 0608 02/17/18 0507 02/18/18 0438  NA 129* 134* 131* 131*  K 5.0 4.3 4.9 5.0  CL 96* 102 100 100  CO2 22 23 21* 20*  GLUCOSE 145* 89 183* 139*  BUN 80* 75* 59* 58*  CREATININE 2.48* 2.19* 1.75* 1.83*  CALCIUM 8.7* 8.7* 8.9 8.7*   GFR: Estimated Creatinine Clearance: 20.9 mL/min (A) (by C-G formula based on SCr of 1.83 mg/dL (H)). Liver Function Tests: No results for input(s): AST, ALT, ALKPHOS, BILITOT, PROT, ALBUMIN in the last 168 hours. No results for input(s): LIPASE, AMYLASE in the last 168 hours. No results for input(s): AMMONIA in the last 168 hours. Coagulation Profile: No results for input(s): INR, PROTIME in the last 168 hours. Cardiac  Enzymes: Recent Labs  Lab 02/01/2018 1737 02/17/18 1731 02/17/18 2259 02/18/18 0438  TROPONINI <0.03 0.03* <0.03 <0.03   BNP (last 3 results) No results for input(s): PROBNP in the last 8760 hours. HbA1C: No results for input(s): HGBA1C in the last 72 hours. CBG: Recent Labs  Lab 02/17/18 0725 02/17/18 1110 02/17/18 1631 02/17/18 2111 02/18/18 0755  GLUCAP 179* 210* 204* 144* 137*   Lipid Profile: No results for input(s): CHOL, HDL, LDLCALC, TRIG, CHOLHDL, LDLDIRECT in the last 72 hours. Thyroid Function Tests: No results for input(s): TSH, T4TOTAL, FREET4, T3FREE, THYROIDAB in the last 72 hours. Anemia Panel: No results for input(s): VITAMINB12, FOLATE, FERRITIN, TIBC, IRON, RETICCTPCT in the last 72 hours. Sepsis Labs: No results for input(s): PROCALCITON, LATICACIDVEN in the last 168 hours.  No results found for this or any previous visit (from the past 240 hour(s)).       Radiology Studies: Dg Chest Port 1 View  Result Date: 02/17/2018 CLINICAL DATA:  Acute shortness of breath and near syncope. EXAM: PORTABLE CHEST 1 VIEW COMPARISON:  02/01/2018 and prior exams FINDINGS: Cardiomegaly and pulmonary vascular congestion noted. A LEFT-sided  pacemaker is present. There is no evidence of focal airspace disease, pulmonary edema, suspicious pulmonary nodule/mass, pleural effusion, or pneumothorax. No acute bony abnormalities are identified. IMPRESSION: Cardiomegaly with pulmonary vascular congestion. Electronically Signed   By: Margarette Canada M.D.   On: 02/17/2018 17:39        Scheduled Meds: . sodium chloride   Intravenous Once  . aspirin EC  81 mg Oral q morning - 10a  . atorvastatin  40 mg Oral QHS  . budesonide (PULMICORT) nebulizer solution  0.25 mg Nebulization BID  . carvedilol  25 mg Oral BID WC  . cholecalciferol  1,000 Units Oral q morning - 10a  . dextromethorphan-guaiFENesin  1 tablet Oral BID  . ferrous sulfate  325 mg Oral Q breakfast  . furosemide  60  mg Intravenous Once  . insulin aspart  0-9 Units Subcutaneous TID WC  . insulin glargine  10 Units Subcutaneous QHS  . ipratropium-albuterol  3 mL Nebulization Q6H  . isosorbide mononitrate  15 mg Oral Daily  . loratadine  10 mg Oral Daily  . methylPREDNISolone (SOLU-MEDROL) injection  40 mg Intravenous Q12H  . omega-3 acid ethyl esters  1 g Oral Daily  . oxybutynin  5 mg Oral BID  . pantoprazole  40 mg Oral BID   Continuous Infusions:   LOS: 0 days    Time spent: 30 minutes    Gildardo Tickner Darleen Crocker, DO [facility] Pager [phone]  If 7PM-7AM, please contact night-coverage [email or web address] Password [facility] 02/18/2018, 9:39 AM

## 2018-02-19 ENCOUNTER — Inpatient Hospital Stay (HOSPITAL_COMMUNITY): Payer: Medicare HMO

## 2018-02-19 LAB — GLUCOSE, CAPILLARY
GLUCOSE-CAPILLARY: 200 mg/dL — AB (ref 70–99)
GLUCOSE-CAPILLARY: 187 mg/dL — AB (ref 70–99)
Glucose-Capillary: 162 mg/dL — ABNORMAL HIGH (ref 70–99)
Glucose-Capillary: 327 mg/dL — ABNORMAL HIGH (ref 70–99)

## 2018-02-19 LAB — BASIC METABOLIC PANEL
Anion gap: 13 (ref 5–15)
BUN: 68 mg/dL — AB (ref 8–23)
CALCIUM: 8.9 mg/dL (ref 8.9–10.3)
CO2: 23 mmol/L (ref 22–32)
CREATININE: 2.07 mg/dL — AB (ref 0.44–1.00)
Chloride: 95 mmol/L — ABNORMAL LOW (ref 98–111)
GFR calc Af Amer: 23 mL/min — ABNORMAL LOW (ref >60)
GFR, EST NON AFRICAN AMERICAN: 20 mL/min — AB (ref >60)
GLUCOSE: 154 mg/dL — AB (ref 70–99)
Potassium: 4.5 mmol/L (ref 3.5–5.1)
Sodium: 131 mmol/L — ABNORMAL LOW (ref 135–145)

## 2018-02-19 LAB — CBC
HEMATOCRIT: 26.5 % — AB (ref 36.0–46.0)
Hemoglobin: 8.3 g/dL — ABNORMAL LOW (ref 12.0–15.0)
MCH: 26 pg (ref 26.0–34.0)
MCHC: 31.3 g/dL (ref 30.0–36.0)
MCV: 83.1 fL (ref 80.0–100.0)
PLATELETS: 215 10*3/uL (ref 150–400)
RBC: 3.19 MIL/uL — ABNORMAL LOW (ref 3.87–5.11)
RDW: 15 % (ref 11.5–15.5)
WBC: 9.1 10*3/uL (ref 4.0–10.5)
nRBC: 0 % (ref 0.0–0.2)

## 2018-02-19 LAB — TYPE AND SCREEN
ABO/RH(D): O POS
ANTIBODY SCREEN: NEGATIVE
Unit division: 0

## 2018-02-19 LAB — TROPONIN I
Troponin I: <0.03 ng/mL (ref <0.03)
Troponin I: 0.03 ng/mL (ref <0.03)
Troponin I: 0.03 ng/mL (ref <0.03)

## 2018-02-19 LAB — BPAM RBC
BLOOD PRODUCT EXPIRATION DATE: 201911282359
ISSUE DATE / TIME: 201910251149
UNIT TYPE AND RH: 5100

## 2018-02-19 MED ORDER — IPRATROPIUM-ALBUTEROL 0.5-2.5 (3) MG/3ML IN SOLN
3.0000 mL | Freq: Four times a day (QID) | RESPIRATORY_TRACT | Status: DC
  Administered 2018-02-19 – 2018-02-20 (×4): 3 mL via RESPIRATORY_TRACT
  Filled 2018-02-19 (×6): qty 3

## 2018-02-19 MED ORDER — FUROSEMIDE 10 MG/ML IJ SOLN
40.0000 mg | Freq: Two times a day (BID) | INTRAMUSCULAR | Status: DC
  Administered 2018-02-19 – 2018-02-20 (×2): 40 mg via INTRAVENOUS
  Filled 2018-02-19 (×2): qty 4

## 2018-02-19 MED ORDER — LORAZEPAM 0.5 MG PO TABS
0.5000 mg | ORAL_TABLET | Freq: Once | ORAL | Status: AC
  Administered 2018-02-19: 0.5 mg via ORAL
  Filled 2018-02-19: qty 1

## 2018-02-19 NOTE — Progress Notes (Signed)
Patient has fine crackles in her lungs. Her saturation on room air is 95. She states that when she wears her CPAP she feels so much better. This is indicative of volume overload. She is positive on her fluids + 2000. She will need more diureses.

## 2018-02-19 NOTE — Progress Notes (Signed)
PROGRESS NOTE    Summer Wong  ONG:[id number] DOB: 01/13/1926 DOA: 02/05/2018 PCP: [facility]   Brief Narrative:   Patient is a 84 year old female with past medical history of atrial fibrillation, chronic diastolic CHF, diabetes mellitus type 2 who presented to the emergency department with complaints of chest pain, shortness of breath, wheezing. She was just discharged from here on October 18 after management for the same. she improved with bronchodilators in the emergency department. She is currently managed for reactive airway disease with possible COPD. She is also having worsening anemia for which she is being evaluated for GI bleed and will receive a PRBC transfusion. Additionally, she is volume overloaded and will require further diuresis.  Assessment & Plan:  Principal Problem: Reactive airway disease with acute exacerbation Active Problems: A-fib (HCC) OSA on CPAP Essential hypertension Chronic anemia CKD (chronic kidney disease) stage 4, GFR 15-29 ml/min (HCC) Hyponatremia Type 2 diabetes mellitus (HCC) Chronic diastolic CHF (congestive heart failure) (HCC)  Acute hypoxemic respiratory failure-persistent: Appears to be multi-factorial and related to initial reactive airways disease, but is now complicated with pulmonary edema and what appears to be acute on chronic diastolic heart failure.  Will maintain on IV Solu-Medrol as well as inhaled steroids and breathing treatments as previously and start aggressive diuresis with Lasix IV BID and monitor inputs and outputs.  Presyncope in the setting of worsening anemia-now stabilizing:  This has improved after PRBC transfusion and appears to remain stable.  FOBT still pending.  Continue on SCDs and avoid Eliquis until FOBT returns.  Repeat CBC in a.m.  Chest pain:Resolved. Monitoring repeat troponins as this has recurred this a.m.  EKG with no acute findings.  Chest x-ray with  findings of ongoing pulmonary vascular congestion.  Acute on chronic diastolic CHF: Echocardiogram done yesterday ejection fraction of 63%, indeterminate diastolic function. Continue Lasix IV twice daily through today and monitor strict inputs and outputs as well as daily weights.  A. fib: Stable. Continue Coreg and hold Eliquis for anticoagulation.S.P pacemaker  Diabetes type 2: Last hemoglobin A1c of 6. Continue Lantus, sliding scale insulin.  CKD stage GM:WNUUVOZ stable.Baseline creatinine around 2-> 2.07 today.  Maintains good urine output with diuresis and therefore will continue ongoing diuresis.  OSA: Continue CPAP at night.  Hypertension:Improved with fluid bolus and no further findings on EKG and cardiac enzymes. Resume Coreg once this further improves. Will monitor with aggressive diuresis.    DVT prophylaxis: Eliquis now to SCDs Code Status:DNR Family Communication:Spoke with Son at bedside Disposition Plan: Transfusion and evaluation for possible GI bleed.  Diuresis due to volume overload.   Consultants:None  Procedures:None  Antimicrobials:None  Subjective: Patient seen and evaluated today with some chest pressure noted this morning that had improved after administration of nitroglycerin.  She continues to feel short of breath and overall uncomfortable.  Objective: Vitals:   02/19/18 0805 02/19/18 0846 02/19/18 0902 02/19/18 1011  BP:  (!) 143/54 (!) 116/56 132/76  Pulse:  97 90 (!) 104  Resp:  (!) 21 20 18   Temp:    98.4 F (36.9 C)  TempSrc:    Oral  SpO2: 99% 94% 92% 96%  Weight:      Height:        Intake/Output Summary (Last 24 hours) at 02/19/2018 1256 Last data filed at 02/19/2018 0944 Gross per 24 hour  Intake 1284 ml  Output 2100 ml  Net -816 ml   Filed Weights   01/30/2018 1655 02/15/18 0545 02/19/18  4742  Weight: 80.2 kg 83.3 kg 82.5 kg    Examination:  General exam: Appears calm and comfortable  Respiratory  system: Clear to auscultation. Respiratory effort normal.  Currently on 3 L nasal cannula. Cardiovascular system: S1 & S2 heard, RRR. No JVD, murmurs, rubs, gallops or clicks. No pedal edema. Gastrointestinal system: Abdomen is nondistended, soft and nontender. No organomegaly or masses felt. Normal bowel sounds heard. Central nervous system: Alert and oriented. No focal neurological deficits. Extremities: Symmetric 5 x 5 power. Skin: No rashes, lesions or ulcers Psychiatry: Judgement and insight appear normal. Mood & affect appropriate.     Data Reviewed: I have personally reviewed following labs and imaging studies  CBC: Recent Labs  Lab 02/15/18 0608 02/16/18 0525 02/17/18 0507 02/18/18 0438 02/18/18 1556 02/19/18 0619  WBC 5.3 4.6 7.6 10.3  --  9.1  HGB 8.6* 8.8* 8.4* 7.2* 8.0* 8.3*  HCT 28.0* 29.5* 27.2* 23.0* 25.5* 26.5*  MCV 83.8 86.8 82.7 84.6  --  83.1  PLT 215 226 240 223  --  595   Basic Metabolic Panel: Recent Labs  Lab 02/05/2018 1737 02/15/18 0608 02/17/18 0507 02/18/18 0438 02/19/18 0619  NA 129* 134* 131* 131* 131*  K 5.0 4.3 4.9 5.0 4.5  CL 96* 102 100 100 95*  CO2 22 23 21* 20* 23  GLUCOSE 145* 89 183* 139* 154*  BUN 80* 75* 59* 58* 68*  CREATININE 2.48* 2.19* 1.75* 1.83* 2.07*  CALCIUM 8.7* 8.7* 8.9 8.7* 8.9   GFR: Estimated Creatinine Clearance: 18.4 mL/min (A) (by C-G formula based on SCr of 2.07 mg/dL (H)). Liver Function Tests: No results for input(s): AST, ALT, ALKPHOS, BILITOT, PROT, ALBUMIN in the last 168 hours. No results for input(s): LIPASE, AMYLASE in the last 168 hours. No results for input(s): AMMONIA in the last 168 hours. Coagulation Profile: No results for input(s): INR, PROTIME in the last 168 hours. Cardiac Enzymes: Recent Labs  Lab 02/05/2018 1737 02/17/18 1731 02/17/18 2259 02/18/18 0438 02/19/18 1002  TROPONINI <0.03 0.03* <0.03 <0.03 <0.03   BNP (last 3 results) No results for input(s): PROBNP in the last 8760  hours. HbA1C: No results for input(s): HGBA1C in the last 72 hours. CBG: Recent Labs  Lab 02/18/18 1112 02/18/18 1646 02/18/18 2050 02/19/18 0738 02/19/18 1125  GLUCAP 157* 144* 224* 162* 327*   Lipid Profile: No results for input(s): CHOL, HDL, LDLCALC, TRIG, CHOLHDL, LDLDIRECT in the last 72 hours. Thyroid Function Tests: No results for input(s): TSH, T4TOTAL, FREET4, T3FREE, THYROIDAB in the last 72 hours. Anemia Panel: No results for input(s): VITAMINB12, FOLATE, FERRITIN, TIBC, IRON, RETICCTPCT in the last 72 hours. Sepsis Labs: No results for input(s): PROCALCITON, LATICACIDVEN in the last 168 hours.  No results found for this or any previous visit (from the past 240 hour(s)).       Radiology Studies: Dg Chest Port 1 View  Result Date: 02/19/2018 CLINICAL DATA:  Chest pain. EXAM: PORTABLE CHEST 1 VIEW COMPARISON:  February 17, 2018 FINDINGS: Stable mild cardiomegaly. Stable pacemaker. The hila and mediastinum are unremarkable. No pneumothorax. Mild opacity in the right lung base. Mild interstitial prominence. IMPRESSION: 1. Cardiomegaly and suspected mild pulmonary venous congestion. 2. Opacity in the right base may represent vascular crowding, atelectasis, or developing infiltrate. Recommend clinical correlation and attention on follow-up. Electronically Signed   By: Dorise Bullion III M.D   On: 02/19/2018 12:13   Dg Chest Port 1 View  Result Date: 02/17/2018 CLINICAL DATA:  Acute shortness of  breath and near syncope. EXAM: PORTABLE CHEST 1 VIEW COMPARISON:  01/28/2018 and prior exams FINDINGS: Cardiomegaly and pulmonary vascular congestion noted. A LEFT-sided pacemaker is present. There is no evidence of focal airspace disease, pulmonary edema, suspicious pulmonary nodule/mass, pleural effusion, or pneumothorax. No acute bony abnormalities are identified. IMPRESSION: Cardiomegaly with pulmonary vascular congestion. Electronically Signed   By: Margarette Canada M.D.   On:  02/17/2018 17:39        Scheduled Meds: . aspirin EC  81 mg Oral q morning - 10a  . atorvastatin  40 mg Oral QHS  . budesonide (PULMICORT) nebulizer solution  0.25 mg Nebulization BID  . carvedilol  25 mg Oral BID WC  . cholecalciferol  1,000 Units Oral q morning - 10a  . dextromethorphan-guaiFENesin  1 tablet Oral BID  . ferrous sulfate  325 mg Oral Q breakfast  . furosemide  40 mg Intravenous q12n4p  . insulin aspart  0-9 Units Subcutaneous TID WC  . insulin glargine  10 Units Subcutaneous QHS  . ipratropium-albuterol  3 mL Nebulization Q6H WA  . isosorbide mononitrate  15 mg Oral Daily  . loratadine  10 mg Oral Daily  . methylPREDNISolone (SOLU-MEDROL) injection  40 mg Intravenous Q12H  . omega-3 acid ethyl esters  1 g Oral Daily  . oxybutynin  5 mg Oral BID  . pantoprazole  40 mg Oral BID   Continuous Infusions:   LOS: 1 day    Time spent: 30 minutes    Tsuyako Jolley Darleen Crocker, DO [facility] Pager [phone]  If 7PM-7AM, please contact night-coverage [email or web address] Password [email or web address] 02/19/2018, 12:56 PM

## 2018-02-19 NOTE — Progress Notes (Signed)
CRITICAL VALUE ALERT  Critical Value:  Troponin 0.03  Date & Time Notied:  02/19/18 @ 2345   Provider Notified: Kennon Holter   Orders Received/Actions taken: awaiting

## 2018-02-20 ENCOUNTER — Inpatient Hospital Stay (HOSPITAL_COMMUNITY): Payer: Medicare HMO

## 2018-02-20 LAB — CBC
HCT: 24.2 % — ABNORMAL LOW (ref 36.0–46.0)
Hemoglobin: 7.8 g/dL — ABNORMAL LOW (ref 12.0–15.0)
MCH: 27.1 pg (ref 26.0–34.0)
MCHC: 32.2 g/dL (ref 30.0–36.0)
MCV: 84 fL (ref 80.0–100.0)
NRBC: 0 % (ref 0.0–0.2)
PLATELETS: 231 10*3/uL (ref 150–400)
RBC: 2.88 MIL/uL — ABNORMAL LOW (ref 3.87–5.11)
RDW: 14.9 % (ref 11.5–15.5)
WBC: 9.4 10*3/uL (ref 4.0–10.5)

## 2018-02-20 LAB — GLUCOSE, CAPILLARY
GLUCOSE-CAPILLARY: 248 mg/dL — AB (ref 70–99)
Glucose-Capillary: 283 mg/dL — ABNORMAL HIGH (ref 70–99)
Glucose-Capillary: 245 mg/dL — ABNORMAL HIGH (ref 70–99)
Glucose-Capillary: 182 mg/dL — ABNORMAL HIGH (ref 70–99)
Glucose-Capillary: 241 mg/dL — ABNORMAL HIGH (ref 70–99)

## 2018-02-20 LAB — COMPREHENSIVE METABOLIC PANEL
ALK PHOS: 114 U/L (ref 38–126)
ALT: 15 U/L (ref 0–44)
AST: 21 U/L (ref 15–41)
Albumin: 3.2 g/dL — ABNORMAL LOW (ref 3.5–5.0)
Anion gap: 12 (ref 5–15)
BILIRUBIN TOTAL: 1 mg/dL (ref 0.3–1.2)
BUN: 69 mg/dL — AB (ref 8–23)
CALCIUM: 8.6 mg/dL — AB (ref 8.9–10.3)
CO2: 22 mmol/L (ref 22–32)
Chloride: 95 mmol/L — ABNORMAL LOW (ref 98–111)
Creatinine, Ser: 2.12 mg/dL — ABNORMAL HIGH (ref 0.44–1.00)
GFR calc Af Amer: 22 mL/min — ABNORMAL LOW (ref >60)
GFR, EST NON AFRICAN AMERICAN: 19 mL/min — AB (ref >60)
Glucose, Bld: 326 mg/dL — ABNORMAL HIGH (ref 70–99)
Potassium: 4.7 mmol/L (ref 3.5–5.1)
Sodium: 129 mmol/L — ABNORMAL LOW (ref 135–145)
Total Protein: 6 g/dL — ABNORMAL LOW (ref 6.5–8.1)

## 2018-02-20 LAB — TROPONIN I
Troponin I: 0.04 ng/mL (ref <0.03)
Troponin I: 0.04 ng/mL (ref <0.03)

## 2018-02-20 LAB — BLOOD GAS, ARTERIAL
ACID-BASE DEFICIT: 2.4 mmol/L — AB (ref 0.0–2.0)
BICARBONATE: 22.5 mmol/L (ref 20.0–28.0)
DRAWN BY: 105551
Delivery systems: POSITIVE
EXPIRATORY PAP: 6
O2 Content: 8 L/min
O2 Saturation: 96.8 %
PH ART: 7.402 (ref 7.350–7.450)
PO2 ART: 84.7 mmHg (ref 83.0–108.0)
pCO2 arterial: 35.4 mmHg (ref 32.0–48.0)

## 2018-02-20 LAB — MAGNESIUM: Magnesium: 2.3 mg/dL (ref 1.7–2.4)

## 2018-02-20 MED ORDER — LORAZEPAM 2 MG/ML IJ SOLN: 1.0000 mg | INTRAMUSCULAR | Status: DC | PRN

## 2018-02-20 MED ORDER — MORPHINE SULFATE (PF) 2 MG/ML IV SOLN: 2.0000 mg | INTRAVENOUS | Status: DC | PRN

## 2018-02-20 MED ORDER — PROMETHAZINE HCL 25 MG/ML IJ SOLN
12.5000 mg | Freq: Four times a day (QID) | INTRAMUSCULAR | Status: DC | PRN
  Administered 2018-02-20 (×2): 12.5 mg via INTRAVENOUS
  Filled 2018-02-20 (×2): qty 1

## 2018-02-20 MED ORDER — IPRATROPIUM-ALBUTEROL 0.5-2.5 (3) MG/3ML IN SOLN: 3.0000 mL | Freq: Four times a day (QID) | RESPIRATORY_TRACT | Status: DC | PRN

## 2018-02-20 NOTE — Progress Notes (Signed)
Patient ID: Summer Wong, female   DOB: 01-Oct-1925, 84 y.o.   MRN: [id number]   Xcover Chest pain earlier per RN Pt was given nitro and her bp went low ? And now seems slightly altered per RN staff  Pt is able to respond appropriate to my questions. Pt denies cp at this time. Seems somnolent. C/o abdominal pain ?  Afebrile  Heent: pupils 1.76mm symmetric, direct, consensual intact Neck: no jvd Heart: irr, irr, s1, s2 Lung: difficult to auscultate due to body habitus, no over wheezing, no crackles.  Abd: soft, morbidly obese Ext: no c/c/e cn2-12 intact Motor 5/5 in all 4 ext Able to squeeze hand to command and move legs to command No clonus  A/P Cp Tele Trop I q6h x3 12 lead ekg CXR  Hypotension,  sbp currently 94 Will monitor  AMS  surger 213? Check CT brain Check ABG  Abdominal pain, n/v Check CT abd/ pelvis

## 2018-02-20 NOTE — Progress Notes (Signed)
Pt back from CT, BP improving. Continues with lethargy but responding to questions, will continue to monitor.

## 2018-02-20 NOTE — Progress Notes (Signed)
Patient family son refused neb treatment.

## 2018-02-20 NOTE — Progress Notes (Signed)
PROGRESS NOTE    Summer Wong  VQQ:[id number] DOB: 08/30/1925 DOA: 02/08/2018 PCP: [facility]   Brief Narrative:   Patient is a 84 year old female with past medical history of atrial fibrillation, chronic diastolic CHF, diabetes mellitus type 2 who presented to the emergency department with complaints of chest pain, shortness of breath, wheezing. She was just discharged from here on October 18 after management for the same. she improved with bronchodilators in the emergency department. She is currently managed for reactive airway disease with possible COPD.She is also having worsening anemia for which she is being evaluated for GI bleed and will receive a PRBC transfusion. Additionally, she is volume overloaded and will require further diuresis.  Assessment & Plan:  Principal Problem: Reactive airway disease with acute exacerbation Active Problems: A-fib (HCC) OSA on CPAP Essential hypertension Chronic anemia CKD (chronic kidney disease) stage 4, GFR 15-29 ml/min (HCC) Hyponatremia Type 2 diabetes mellitus (HCC) Chronic diastolic CHF (congestive heart failure) (HCC)  Acute hypoxemic respiratory failure-persistent: Appears to be multi-factorial and related to initial reactive airways disease, but is now complicated with pulmonary edema for which diuresis was initiated.  Unfortunately, there appear to be significant metastatic lesions in the abdominal cavity that are significant in size which may be also causing mechanical restriction in her breathing.  She will not be maintained on nasal cannula with morphine and Ativan for comfort and comfort measures.  Multiple intra-abdominal masses suspicious of metastatic lesions: This is in the setting of prior known endometrial cancer and hysterectomy in 2016.  Discussed case with Dr. Delton Coombes who feels that comfort measures would be appropriate with no further treatment or evaluation at this  time.  Ordered CEA, CA-19-9, and Ca1 25 for documentation purposes.  Discussed that comfort measures would be the appropriate course of treatment with family members who are currently in agreement and understand.  Presyncope in the setting of worsening anemia-now stabilizing: This has improved after PRBC transfusion and appears to be downtrending slightly, however now on comfort measures.  No overt bleeding identified.  Maintain on SCDs.  Chest pain:Resolved. Monitoring repeat troponins as this has recurred this a.m.  EKG with no acute findings.  Chest x-ray with findings of ongoing pulmonary vascular congestion.  No further pain noted at this time.  Acute on chronic diastolic CHF: Echocardiogram done yesterday ejection fraction of 63%, indeterminate diastolic function. No further diuresis or monitoring of strict I's and O's while on comfort measures.  A. fib: Stable. Discontinue Coreg and Eliquis..S.P pacemaker  Diabetes type 2: Last hemoglobin A1c of 6.  Discontinue Lantus and SSI while on comfort measures.  CKD stage EP:PIRJJOA stable.Baseline creatinine around 2, discontinue monitoring and further diuresis.  OSA: Continue only on nasal cannula CPAP is not tolerated.  Hypertension:Discontinue further hypertensive medications.    DVT prophylaxis: Eliquisnow to SCDs Code Status:DNR; now comfort measures Family Communication:Spoke with Son at bedside Disposition Plan:Comfort measures with palliative care evaluation in a.m. likely transfer to hospice soon.   Consultants:Palliative care in a.m.  Procedures:None  Antimicrobials:None  Subjective: Patient seen and evaluated today with significant dry heaving and nausea and vomiting overnight prompting CT evaluation with noted masses.  She is quite sedated at this time with some improvement in nausea and vomiting currently noted.  Objective: Vitals:   02/20/18 0507 02/20/18 0700 02/20/18 0800 02/20/18 0807    BP: 107/68     Pulse: 61     Resp: (!) 22     Temp: (!) 97.5 F (  36.4 C)     TempSrc: Oral     SpO2: 99%  95% 96%  Weight:  83.7 kg    Height:        Intake/Output Summary (Last 24 hours) at 02/20/2018 1242 Last data filed at 02/19/2018 2100 Gross per 24 hour  Intake 180 ml  Output 300 ml  Net -120 ml   Filed Weights   02/15/18 0545 02/19/18 0611 02/20/18 0700  Weight: 83.3 kg 82.5 kg 83.7 kg    Examination:  General exam: Appears calm and comfortable  Respiratory system: Clear to auscultation. Respiratory effort normal.  Currently on nasal cannula. Cardiovascular system: S1 & S2 heard, RRR. No JVD, murmurs, rubs, gallops or clicks. No pedal edema. Gastrointestinal system: Abdomen is nondistended, soft and nontender. No organomegaly or masses felt. Normal bowel sounds heard. Central nervous system: Sedated Extremities: Symmetric 5 x 5 power. Skin: No rashes, lesions or ulcers, appears pale and jaundiced Psychiatry: Cannot be evaluated    Data Reviewed: I have personally reviewed following labs and imaging studies  CBC: Recent Labs  Lab 02/16/18 0525 02/17/18 0507 02/18/18 0438 02/18/18 1556 02/19/18 0619 02/20/18 0430  WBC 4.6 7.6 10.3  --  9.1 9.4  HGB 8.8* 8.4* 7.2* 8.0* 8.3* 7.8*  HCT 29.5* 27.2* 23.0* 25.5* 26.5* 24.2*  MCV 86.8 82.7 84.6  --  83.1 84.0  PLT 226 240 223  --  215 283   Basic Metabolic Panel: Recent Labs  Lab 02/15/18 0608 02/17/18 0507 02/18/18 0438 02/19/18 0619 02/20/18 0430 02/20/18 0431  NA 134* 131* 131* 131*  --  129*  K 4.3 4.9 5.0 4.5  --  4.7  CL 102 100 100 95*  --  95*  CO2 23 21* 20* 23  --  22  GLUCOSE 89 183* 139* 154*  --  326*  BUN 75* 59* 58* 68*  --  69*  CREATININE 2.19* 1.75* 1.83* 2.07*  --  2.12*  CALCIUM 8.7* 8.9 8.7* 8.9  --  8.6*  MG  --   --   --   --  2.3  --    GFR: Estimated Creatinine Clearance: 18.1 mL/min (A) (by C-G formula based on SCr of 2.12 mg/dL (H)). Liver Function Tests: Recent  Labs  Lab 02/20/18 0431  AST 21  ALT 15  ALKPHOS 114  BILITOT 1.0  PROT 6.0*  ALBUMIN 3.2*   No results for input(s): LIPASE, AMYLASE in the last 168 hours. No results for input(s): AMMONIA in the last 168 hours. Coagulation Profile: No results for input(s): INR, PROTIME in the last 168 hours. Cardiac Enzymes: Recent Labs  Lab 02/19/18 1002 02/19/18 1646 02/19/18 2126 02/20/18 0430 02/20/18 1107  TROPONINI <0.03 0.03* 0.03* 0.04* 0.04*   BNP (last 3 results) No results for input(s): PROBNP in the last 8760 hours. HbA1C: No results for input(s): HGBA1C in the last 72 hours. CBG: Recent Labs  Lab 02/19/18 1638 02/19/18 2113 02/20/18 0410 02/20/18 0728 02/20/18 1127  GLUCAP 200* 187* 283* 248* 245*   Lipid Profile: No results for input(s): CHOL, HDL, LDLCALC, TRIG, CHOLHDL, LDLDIRECT in the last 72 hours. Thyroid Function Tests: No results for input(s): TSH, T4TOTAL, FREET4, T3FREE, THYROIDAB in the last 72 hours. Anemia Panel: No results for input(s): VITAMINB12, FOLATE, FERRITIN, TIBC, IRON, RETICCTPCT in the last 72 hours. Sepsis Labs: No results for input(s): PROCALCITON, LATICACIDVEN in the last 168 hours.  No results found for this or any previous visit (from the past 240 hour(s)).  Radiology Studies: Ct Abdomen Pelvis Wo Contrast  Result Date: 02/20/2018 CLINICAL DATA:  84 year old female. History of high-grade endometrial carcinoma diagnosis in 2016 status post prior hysterectomy presenting with abdominal pain, nausea vomiting. EXAM: CT ABDOMEN AND PELVIS WITHOUT CONTRAST TECHNIQUE: Multidetector CT imaging of the abdomen and pelvis was performed following the standard protocol without IV contrast. COMPARISON:  CT of the abdomen pelvis dated 12/14/2014 and 07/23/2014 FINDINGS: Evaluation of this exam is limited in the absence of intravenous contrast. Lower chest: Trace right pleural effusion and subsegmental right lung base atelectasis. There is  multi vessel coronary vascular calcification. An AICD device is partially visualized. There is hypoattenuation of the cardiac blood pool suggestive of a degree of anemia. Clinical correlation is recommended. No intra-abdominal free air. Small ascites. Hepatobiliary: Relatively stable 15 mm hypodense lesion in the left lobe of the liver, incompletely characterized on this noncontrast CT, likely cysts. Cholecystectomy. No retained calcified stone noted in the central CBD. Pancreas: Unremarkable. No pancreatic ductal dilatation or surrounding inflammatory changes. Spleen: Multiple splenic hypodense lesions with the largest lesion measuring approximately 4.7 x 3.7 cm, and new since the prior CT concerning for metastatic disease. Splenic abscesses are less likely but not excluded. Clinical correlation is recommended. Adrenals/Urinary Tract: The adrenal glands are unremarkable. Stable 15 mm left renal upper pole hypodense lesion, likely a cyst. There is minimal fullness of the right renal collecting system likely secondary to mass effect and compression of the distal right ureter. No stone. There is no hydronephrosis or nephrolithiasis on the left. The urinary bladder is unremarkable. Stomach/Bowel: There is a small hiatal hernia. Moderate amount of stool noted throughout the colon. There is no bowel obstruction or active inflammation. There is sigmoid diverticulosis without active inflammatory changes. Normal appendix. Vascular/Lymphatic: Advanced aortoiliac atherosclerotic disease. No portal venous gas. No retroperitoneal adenopathy. Reproductive: Hysterectomy. Other: There are multiple soft tissue masses within the abdomen and pelvis, one measuring approximately 10 x 8 x 10 cm in the lower abdomen/pelvis and an additional mass more superiorly measures approximately 7 x 8 x 14 cm. There is a 4.5 x 6.5 x 4.5 cm mass in the posterior pelvis. There is nodularity of the omentum in the left upper abdomen. Findings most  consistent with malignancy such as sarcoma or recurrent/metastatic disease related to known endometrial cancer. Musculoskeletal: Osteopenia with degenerative changes of the spine and scoliosis. No acute osseous pathology. IMPRESSION: 1. Multiple soft tissue masses within the abdomen and pelvis, new since the prior CT consistent with malignancy or recurrence/metastatic disease. 2. Interval development of multiple splenic hypodense lesions concerning for metastatic disease. 3. No bowel obstruction or active inflammation. Normal appendix. 4. Small right pleural effusion and small ascites. Electronically Signed   By: Anner Crete M.D.   On: 02/20/2018 05:38   Ct Head Wo Contrast  Result Date: 02/20/2018 CLINICAL DATA:  84 y/o F; nausea, vomiting, altered level of consciousness. EXAM: CT HEAD WITHOUT CONTRAST TECHNIQUE: Contiguous axial images were obtained from the base of the skull through the vertex without intravenous contrast. COMPARISON:  07/15/2017 CT head. FINDINGS: Brain: No evidence of acute infarction, hemorrhage, hydrocephalus, extra-axial collection or mass lesion/mass effect. Stable chronic microvascular ischemic changes and volume loss of the brain. Stable small chronic infarction within the right anterior basal ganglia. Vascular: Calcific atherosclerosis of the carotid siphons. No hyperdense vessel identified. Skull: Normal. Negative for fracture or focal lesion. Sinuses/Orbits: Right maxillary sinus mucous retention cyst. Chronic inflammatory changes of the walls of right sphenoid sinus. Additional visible  paranasal sinuses are normally aerated. Stable right mastoid tip opacification. Normal aeration of left mastoid air cells. Bilateral intra-ocular lens replacement. Other: None. IMPRESSION: 1. No acute intracranial abnormality identified. 2. Stable chronic microvascular ischemic changes and volume loss of the brain. Stable small chronic infarct in the right anterior basal ganglia.  Electronically Signed   By: Kristine Garbe M.D.   On: 02/20/2018 05:10   Dg Chest Port 1 View  Result Date: 02/20/2018 CLINICAL DATA:  84 year old female with nausea vomiting. EXAM: PORTABLE CHEST 1 VIEW COMPARISON:  Chest radiograph dated 02/19/2018 FINDINGS: There is no focal consolidation, pleural effusion, or pneumothorax. Borderline cardiomegaly. Left pectoral pacemaker device. Atherosclerotic calcification of the aortic arch. No acute osseous pathology. IMPRESSION: No active disease. Electronically Signed   By: Anner Crete M.D.   On: 02/20/2018 05:16   Dg Chest Port 1 View  Result Date: 02/19/2018 CLINICAL DATA:  Chest pain. EXAM: PORTABLE CHEST 1 VIEW COMPARISON:  February 17, 2018 FINDINGS: Stable mild cardiomegaly. Stable pacemaker. The hila and mediastinum are unremarkable. No pneumothorax. Mild opacity in the right lung base. Mild interstitial prominence. IMPRESSION: 1. Cardiomegaly and suspected mild pulmonary venous congestion. 2. Opacity in the right base may represent vascular crowding, atelectasis, or developing infiltrate. Recommend clinical correlation and attention on follow-up. Electronically Signed   By: Dorise Bullion III M.D   On: 02/19/2018 12:13        Scheduled Meds: . budesonide (PULMICORT) nebulizer solution  0.25 mg Nebulization BID  . dextromethorphan-guaiFENesin  1 tablet Oral BID  . ipratropium-albuterol  3 mL Nebulization Q6H WA  . loratadine  10 mg Oral Daily  . oxybutynin  5 mg Oral BID  . pantoprazole  40 mg Oral BID   Continuous Infusions:   LOS: 2 days    Time spent: 30 minutes    Emmogene Simson Darleen Crocker, DO [facility] Pager [phone]  If 7PM-7AM, please contact night-coverage [email or web address] Password [email or web address] 02/20/2018, 12:42 PM

## 2018-02-20 NOTE — Progress Notes (Signed)
Patient was placed back on CPAP after re turing from CT , but she is still gagging so mask has been removed she is now on 4lpm nasal cannula.

## 2018-02-20 NOTE — Progress Notes (Signed)
Pt called me in room stating that her stomach was hurting at 0330, fast breathing noted and then began dry heaving. Settled down after c-pap was removed and O2 placed via [place]. Administered zofran. Called back into room shortly after by pt, noted to be dry heaving again and then c/o "my chest is hurting really bad" Assessed vitals at that time BP 113/74 HR 79 sats 99. Nitro stat administered but noted pt was lethargic right after, assessed BP within 2 mins, BP 96/54. Notified charge nurse and MD to assess d/t changed of condition.

## 2018-02-20 NOTE — Progress Notes (Signed)
Patient was placed on CPAP 6 which was ramping with 8 liter nasal cannula under mask. This was setting when Blood Gas was drawn.

## 2018-02-21 DIAGNOSIS — C799 Secondary malignant neoplasm of unspecified site: Secondary | ICD-10-CM

## 2018-02-21 DIAGNOSIS — J4541 Moderate persistent asthma with (acute) exacerbation: Secondary | ICD-10-CM

## 2018-02-21 DIAGNOSIS — Z515 Encounter for palliative care: Secondary | ICD-10-CM

## 2018-02-21 LAB — GLUCOSE, CAPILLARY: GLUCOSE-CAPILLARY: 179 mg/dL — AB (ref 70–99)

## 2018-02-21 LAB — CEA: CEA: 2.4 ng/mL (ref 0.0–4.7)

## 2018-02-21 LAB — CA 125: Cancer Antigen (CA) 125: 2056 U/mL — ABNORMAL HIGH (ref 0.0–38.1)

## 2018-02-21 LAB — CANCER ANTIGEN 19-9

## 2018-02-21 MED ORDER — LORAZEPAM 0.5 MG PO TABS
0.5000 mg | ORAL_TABLET | Freq: Four times a day (QID) | ORAL | Status: DC | PRN
  Administered 2018-02-22: 0.5 mg via SUBLINGUAL
  Filled 2018-02-21: qty 1

## 2018-02-21 MED ORDER — LORAZEPAM 2 MG/ML PO CONC: 0.5000 mg | Freq: Four times a day (QID) | ORAL | Status: DC | PRN

## 2018-02-21 MED ORDER — HYDROCORTISONE ACETATE 25 MG RE SUPP
25.0000 mg | Freq: Once | RECTAL | Status: AC
  Administered 2018-02-21: 25 mg via RECTAL
  Filled 2018-02-21: qty 1

## 2018-02-21 MED ORDER — OLANZAPINE 5 MG PO TBDP
2.5000 mg | ORAL_TABLET | Freq: Every day | ORAL | Status: DC
  Administered 2018-02-22: 2.5 mg via ORAL
  Filled 2018-02-21 (×4): qty 0.5

## 2018-02-21 MED ORDER — MORPHINE SULFATE (CONCENTRATE) 10 MG/0.5ML PO SOLN
5.0000 mg | ORAL | Status: DC | PRN
  Administered 2018-02-22 (×2): 5 mg via SUBLINGUAL
  Filled 2018-02-21 (×2): qty 0.5

## 2018-02-21 MED ORDER — METOCLOPRAMIDE HCL 5 MG/ML IJ SOLN
5.0000 mg | Freq: Four times a day (QID) | INTRAMUSCULAR | Status: DC
  Administered 2018-02-21 – 2018-02-22 (×4): 5 mg via INTRAVENOUS
  Filled 2018-02-21 (×4): qty 2

## 2018-02-21 NOTE — Progress Notes (Signed)
PROGRESS NOTE    Summer Wong  QJJ:[id number] DOB: Nov 22, 1925 DOA: 02/13/2018 PCP: [facility]   Brief Narrative:   Patient is a 84 year old female with past medical history of atrial fibrillation, chronic diastolic CHF, diabetes mellitus type 2 who presented to the emergency department with complaints of chest pain, shortness of breath, wheezing. She was just discharged from here on October 18 after management for the same. she improved with bronchodilators in the emergency department. She is currently managed for reactive airway disease with possible COPD. She had worsening anemia for which she received PRBC transfusion and then was noted to be volume overloaded for which she required diuresis.  Unfortunately, on the morning of 10/27 she began retching and having uncontrollable nausea and vomiting for which CT of the abdomen and pelvis was ordered demonstrating the presence of multiple large abdominal masses suggestive of metastatic disease.  Family members understand that she is near end of life and has been transitioned to comfort measures with recommendations given by palliative care today.  Assessment & Plan:  Principal Problem: Reactive airway disease with acute exacerbation Active Problems: A-fib (HCC) OSA on CPAP Essential hypertension Chronic anemia CKD (chronic kidney disease) stage 4, GFR 15-29 ml/min (HCC) Hyponatremia Type 2 diabetes mellitus (HCC) Chronic diastolic CHF (congestive heart failure) (HCC)  Acute hypoxemic respiratory failure-persistent: Appears to be multi-factorial and related to initial reactive airways disease, but is now complicated with pulmonary edema for which diuresis was initiated.  Unfortunately, there appear to be significant metastatic lesions in the abdominal cavity that are significant in size which may be also causing mechanical restriction in her breathing.    Continue cover measures with  recommendations of oral morphine and Ativan per palliative care appreciated.  Multiple intra-abdominal masses suspicious of metastatic lesions: This is in the setting of prior known endometrial cancer and hysterectomy in 2016.  Discussed case with Dr. Delton Coombes who feels that comfort measures would be appropriate with no further treatment or evaluation at this time.  Ordered CEA, CA-19-9, and Ca125 for documentation purposes.    Continue comfort measures with Reglan, Zofran, and Zyprexa for nausea and vomiting related discomfort.  Presyncope in the setting of worsening anemia-now stabilizing:This has improved after PRBC transfusion and appears to be downtrending slightly, however now on comfort measures.  No overt bleeding identified.    Discontinue SCDs.  Chest pain:Resolved. Monitoring repeat troponins as this has recurred this a.m. EKG with no acute findings. Chest x-ray with findings of ongoing pulmonary vascular congestion.  No further pain noted at this time.  Acute onchronic diastolic CHF: Echocardiogram done yesterday ejection fraction of 63%, indeterminate diastolic function. No further diuresis or monitoring of strict I's and O's while on comfort measures.  A. fib: Stable. Discontinue Coreg and Eliquis..S.P pacemaker  Diabetes type 2: Last hemoglobin A1c of 6.  Discontinue Lantus and SSI while on comfort measures.  CKD stage GY:JEHUDJS stable.Baseline creatinine around 2, discontinue monitoring and further diuresis.  OSA: Continue only on nasal cannula CPAP is not tolerated.  Hypertension:Discontinue further hypertensive medications.    DVT prophylaxis: Eliquisnow to SCDs Code Status:DNR; now comfort measures Family Communication:Spoke with Son at bedside and family Disposition Plan:Continue comfort measures as outlined by palliative care with anticipated in-hospital death.   Consultants:Palliative care    Procedures:None  Antimicrobials:None  Subjective: Patient seen and evaluated today with ongoing sedation with Phenergan.  Appears to have ongoing nausea and vomiting without medication management.  No acute events noted overnight.  Objective:  Vitals:   02/20/18 1529 02/20/18 2059 02/20/18 2146 02/21/18 0531  BP: (!) 98/54  (!) 108/56 (!) 101/52  Pulse: 75 74 70 73  Resp: 16 16 (!) 23 18  Temp:   98.4 F (36.9 C) 98.3 F (36.8 C)  TempSrc:   Oral Oral  SpO2: 100% 98% 100% 100%  Weight:      Height:       No intake or output data in the 24 hours ending 02/21/18 1052 Filed Weights   02/15/18 0545 02/19/18 0611 02/20/18 0700  Weight: 83.3 kg 82.5 kg 83.7 kg    Examination:  General exam: Sedated Respiratory system: Clear to auscultation. Respiratory effort normal. Cardiovascular system: S1 & S2 heard, RRR. No JVD, murmurs, rubs, gallops or clicks. No pedal edema. Gastrointestinal system: Abdomen is nondistended, soft and nontender. No organomegaly or masses felt. Normal bowel sounds heard. Central nervous system: Cannot be evaluated Extremities: Symmetric 5 x 5 power. Skin: No rashes, lesions or ulcers Psychiatry: Cannot be evaluated    Data Reviewed: I have personally reviewed following labs and imaging studies  CBC: Recent Labs  Lab 02/16/18 0525 02/17/18 0507 02/18/18 0438 02/18/18 1556 02/19/18 0619 02/20/18 0430  WBC 4.6 7.6 10.3  --  9.1 9.4  HGB 8.8* 8.4* 7.2* 8.0* 8.3* 7.8*  HCT 29.5* 27.2* 23.0* 25.5* 26.5* 24.2*  MCV 86.8 82.7 84.6  --  83.1 84.0  PLT 226 240 223  --  215 829   Basic Metabolic Panel: Recent Labs  Lab 02/15/18 0608 02/17/18 0507 02/18/18 0438 02/19/18 0619 02/20/18 0430 02/20/18 0431  NA 134* 131* 131* 131*  --  129*  K 4.3 4.9 5.0 4.5  --  4.7  CL 102 100 100 95*  --  95*  CO2 23 21* 20* 23  --  22  GLUCOSE 89 183* 139* 154*  --  326*  BUN 75* 59* 58* 68*  --  69*  CREATININE 2.19* 1.75* 1.83* 2.07*  --  2.12*   CALCIUM 8.7* 8.9 8.7* 8.9  --  8.6*  MG  --   --   --   --  2.3  --    GFR: Estimated Creatinine Clearance: 18.1 mL/min (A) (by C-G formula based on SCr of 2.12 mg/dL (H)). Liver Function Tests: Recent Labs  Lab 02/20/18 0431  AST 21  ALT 15  ALKPHOS 114  BILITOT 1.0  PROT 6.0*  ALBUMIN 3.2*   No results for input(s): LIPASE, AMYLASE in the last 168 hours. No results for input(s): AMMONIA in the last 168 hours. Coagulation Profile: No results for input(s): INR, PROTIME in the last 168 hours. Cardiac Enzymes: Recent Labs  Lab 02/19/18 1002 02/19/18 1646 02/19/18 2126 02/20/18 0430 02/20/18 1107  TROPONINI <0.03 0.03* 0.03* 0.04* 0.04*   BNP (last 3 results) No results for input(s): PROBNP in the last 8760 hours. HbA1C: No results for input(s): HGBA1C in the last 72 hours. CBG: Recent Labs  Lab 02/20/18 0728 02/20/18 1127 02/20/18 1619 02/20/18 2151 02/21/18 0741  GLUCAP 248* 245* 182* 241* 179*   Lipid Profile: No results for input(s): CHOL, HDL, LDLCALC, TRIG, CHOLHDL, LDLDIRECT in the last 72 hours. Thyroid Function Tests: No results for input(s): TSH, T4TOTAL, FREET4, T3FREE, THYROIDAB in the last 72 hours. Anemia Panel: No results for input(s): VITAMINB12, FOLATE, FERRITIN, TIBC, IRON, RETICCTPCT in the last 72 hours. Sepsis Labs: No results for input(s): PROCALCITON, LATICACIDVEN in the last 168 hours.  No results found for this or any previous  visit (from the past 240 hour(s)).       Radiology Studies: Ct Abdomen Pelvis Wo Contrast  Result Date: 02/20/2018 CLINICAL DATA:  84 year old female. History of high-grade endometrial carcinoma diagnosis in 2016 status post prior hysterectomy presenting with abdominal pain, nausea vomiting. EXAM: CT ABDOMEN AND PELVIS WITHOUT CONTRAST TECHNIQUE: Multidetector CT imaging of the abdomen and pelvis was performed following the standard protocol without IV contrast. COMPARISON:  CT of the abdomen pelvis dated  12/14/2014 and 07/23/2014 FINDINGS: Evaluation of this exam is limited in the absence of intravenous contrast. Lower chest: Trace right pleural effusion and subsegmental right lung base atelectasis. There is multi vessel coronary vascular calcification. An AICD device is partially visualized. There is hypoattenuation of the cardiac blood pool suggestive of a degree of anemia. Clinical correlation is recommended. No intra-abdominal free air. Small ascites. Hepatobiliary: Relatively stable 15 mm hypodense lesion in the left lobe of the liver, incompletely characterized on this noncontrast CT, likely cysts. Cholecystectomy. No retained calcified stone noted in the central CBD. Pancreas: Unremarkable. No pancreatic ductal dilatation or surrounding inflammatory changes. Spleen: Multiple splenic hypodense lesions with the largest lesion measuring approximately 4.7 x 3.7 cm, and new since the prior CT concerning for metastatic disease. Splenic abscesses are less likely but not excluded. Clinical correlation is recommended. Adrenals/Urinary Tract: The adrenal glands are unremarkable. Stable 15 mm left renal upper pole hypodense lesion, likely a cyst. There is minimal fullness of the right renal collecting system likely secondary to mass effect and compression of the distal right ureter. No stone. There is no hydronephrosis or nephrolithiasis on the left. The urinary bladder is unremarkable. Stomach/Bowel: There is a small hiatal hernia. Moderate amount of stool noted throughout the colon. There is no bowel obstruction or active inflammation. There is sigmoid diverticulosis without active inflammatory changes. Normal appendix. Vascular/Lymphatic: Advanced aortoiliac atherosclerotic disease. No portal venous gas. No retroperitoneal adenopathy. Reproductive: Hysterectomy. Other: There are multiple soft tissue masses within the abdomen and pelvis, one measuring approximately 10 x 8 x 10 cm in the lower abdomen/pelvis and an  additional mass more superiorly measures approximately 7 x 8 x 14 cm. There is a 4.5 x 6.5 x 4.5 cm mass in the posterior pelvis. There is nodularity of the omentum in the left upper abdomen. Findings most consistent with malignancy such as sarcoma or recurrent/metastatic disease related to known endometrial cancer. Musculoskeletal: Osteopenia with degenerative changes of the spine and scoliosis. No acute osseous pathology. IMPRESSION: 1. Multiple soft tissue masses within the abdomen and pelvis, new since the prior CT consistent with malignancy or recurrence/metastatic disease. 2. Interval development of multiple splenic hypodense lesions concerning for metastatic disease. 3. No bowel obstruction or active inflammation. Normal appendix. 4. Small right pleural effusion and small ascites. Electronically Signed   By: Anner Crete M.D.   On: 02/20/2018 05:38   Ct Head Wo Contrast  Result Date: 02/20/2018 CLINICAL DATA:  84 y/o F; nausea, vomiting, altered level of consciousness. EXAM: CT HEAD WITHOUT CONTRAST TECHNIQUE: Contiguous axial images were obtained from the base of the skull through the vertex without intravenous contrast. COMPARISON:  07/15/2017 CT head. FINDINGS: Brain: No evidence of acute infarction, hemorrhage, hydrocephalus, extra-axial collection or mass lesion/mass effect. Stable chronic microvascular ischemic changes and volume loss of the brain. Stable small chronic infarction within the right anterior basal ganglia. Vascular: Calcific atherosclerosis of the carotid siphons. No hyperdense vessel identified. Skull: Normal. Negative for fracture or focal lesion. Sinuses/Orbits: Right maxillary sinus mucous retention cyst.  Chronic inflammatory changes of the walls of right sphenoid sinus. Additional visible paranasal sinuses are normally aerated. Stable right mastoid tip opacification. Normal aeration of left mastoid air cells. Bilateral intra-ocular lens replacement. Other: None. IMPRESSION:  1. No acute intracranial abnormality identified. 2. Stable chronic microvascular ischemic changes and volume loss of the brain. Stable small chronic infarct in the right anterior basal ganglia. Electronically Signed   By: Kristine Garbe M.D.   On: 02/20/2018 05:10   Dg Chest Port 1 View  Result Date: 02/20/2018 CLINICAL DATA:  84 year old female with nausea vomiting. EXAM: PORTABLE CHEST 1 VIEW COMPARISON:  Chest radiograph dated 02/19/2018 FINDINGS: There is no focal consolidation, pleural effusion, or pneumothorax. Borderline cardiomegaly. Left pectoral pacemaker device. Atherosclerotic calcification of the aortic arch. No acute osseous pathology. IMPRESSION: No active disease. Electronically Signed   By: Anner Crete M.D.   On: 02/20/2018 05:16        Scheduled Meds: . hydrocortisone  25 mg Rectal Once  . metoCLOPramide (REGLAN) injection  5 mg Intravenous Q6H  . OLANZapine zydis  2.5 mg Oral QHS   Continuous Infusions:   LOS: 3 days    Time spent: 30 minutes    Dezra Mandella Darleen Crocker, DO [facility] Pager [phone]  If 7PM-7AM, please contact night-coverage [email or web address] Password [email or web address] 02/21/2018, 10:52 AM

## 2018-02-21 NOTE — Consult Note (Signed)
Consultation Note Date: 02/21/2018   Patient Name: Summer Wong  DOB: 09-12-1925  MRN: [id number]  Age / Sex: 84 y.o., female  PCP: [facility] Referring Physician: Rodena Goldmann, DO  Reason for Consultation: Establishing goals of care, Hospice Evaluation and Psychosocial/spiritual support  HPI/Patient Profile: 84 y.o. female  with past medical history of endometrial cancer, DM2, CKD, MI, s/p pacemaker, afib, CHF, OSA, and COPD who was admitted on 02/01/2018 with difficulty breathing.  Work up revealed continued reactive airway disease as well as masses of metastatic disease in her abdomen, pelvis, and spleen, that are so large they are causing significant pain, and difficulty breathing.  Dr. Manuella Wong shifted the patient to comfort care measures on 02/20/2018.  Clinical Assessment and Goals of Care:  I have reviewed medical records including EPIC notes, labs and imaging, received report from the care team, assessed the patient and then met at the bedside along with her son and DIL  to discuss diagnosis prognosis, GOC, EOL wishes, disposition and options.  I introduced Palliative Medicine as specialized medical care for people living with serious illness. It focuses on providing relief from the symptoms and stress of a serious illness. The goal is to improve quality of life for both the patient and the family.  Fortunately, I know the [place] Silos family from caring for other family members - so it was a pleasure to see Summer Wong and Summer Wong again.  We discussed a brief life review of the patient.  Summer Wong lives next door to her son and DIL.  She had a hysterectomy in 2016 for endometrial cancer but has been healthy and independent since then.  Prior to admission she was driving, cleaning her own house and keeping her 2 year old grandson after school regularly.  As far as functional and  nutritional status - she has grown more fatigued in recent weeks and started suffering with intermittent bouts of diarrhea.  During this admission she was last out of bed on Saturday and ate a small salad.  She has stopped eating and drinking due to severe nausea over the last two days.  She has a pure wick in place and it appears she has not urinated since yesterday.  The family understands after speaking with Dr. Manuella Wong that Summer Wong has metastatic cancer with large masses in her abdomen and pelvis.  She is too weak to undergo treatment.  She is near end of life.  Hospice and Palliative Care services outpatient were explained and offered.  The family thought thru taking her home vs going to hospice house.  They are concerned that her symptoms have become too severe for her to be comfortable at home.    Her son, Summer Wong called the family yesterday and told them to visit her today.  They are due to arrive this evening.  Summer Wong would like to talk with them about the options and reassess tomorrow 10/29.  I expressed my strong concern that she is progressing quickly and may be too unstable  to leave the hospital.  Questions and concerns were addressed.  The family was encouraged to call with questions or concerns.     Primary Decision Maker:  NEXT OF KIN son, Summer Wong.    SUMMARY OF RECOMMENDATIONS    Will start low dose Zyprexa ZDT (dissolves under the tongue) for nausea and discomfort Will add low dose ativan solution PRN for nausea and anxiety - it may be more gentle than IV. Will add low dose SL morphine for dyspnea and moderate pain - may last longer than IV morphine. Will trial suppository to attempt to relieve pressure in lower abdomen.  PMT will follow up at 7:30 am on 10/29 to reassess and meet with the rest of the family coming in from out of town.  Code Status/Advance Care Planning:  DNR  Additional Recommendations (Limitations, Scope, Preferences):  Full Comfort Care and  Minimize Medications  Palliative Prophylaxis:   Delirium Protocol  Prognosis:   Hours to Days.  Very likely less than 1 week  Discharge Planning: Anticipated Hospital Death.  She is hospice house eligible, but family wants to discuss.  I believe she will very quickly become too unstable to leave the hospital.      Primary Diagnoses: Present on Admission: . Essential hypertension . COPD exacerbation ([place])   I have reviewed the medical record, interviewed the patient and family, and examined the patient. The following aspects are pertinent.  Past Medical History:  Diagnosis Date  . Arthritis   . Asthma   . Atrial fibrillation ([place])    Status post cardioversion in January 2016  . Baker's cyst   . Cancer ([place])    Uterine  . CHF (congestive heart failure) ([place])   . Chronic diastolic heart failure ([place])   . COPD (chronic obstructive pulmonary disease) ([place])   . Essential hypertension   . GERD (gastroesophageal reflux disease)   . Heart murmur   . History of hepatitis A   . Hyperlipemia   . Macular degeneration   . OSA on CPAP   . Pacemaker 2007   [employer] - sick sinus syndrome  . Renal insufficiency   . Sick sinus syndrome ([place]) 2007  . Silent myocardial infarction ([place]) 2006  . Stress incontinence   . TB lung, latent   . Type 2 diabetes mellitus ([place])    Social History   Socioeconomic History  . Marital status: Widowed    Spouse name: Not on file  . Number of children: 2  . Years of education: Not on file  . Highest education level: Not on file  Occupational History  . Not on file  Social Needs  . Financial resource strain: Not on file  . Food insecurity:    Worry: Not on file    Inability: Not on file  . Transportation needs:    Medical: Not on file    Non-medical: Not on file  Tobacco Use  . Smoking status: Never Smoker  . Smokeless tobacco: Never Used  Substance and Sexual Activity  . Alcohol use: No  . Drug use: No  . Sexual activity:  Never  Lifestyle  . Physical activity:    Days per week: Not on file    Minutes per session: Not on file  . Stress: Not on file  Relationships  . Social connections:    Talks on phone: Not on file    Gets together: Not on file    Attends religious service: Not on file  Active member of club or organization: Not on file    Attends meetings of clubs or organizations: Not on file    Relationship status: Not on file  Other Topics Concern  . Not on file  Social History Narrative  . Not on file   Family History  Problem Relation Age of Onset  . Stomach cancer Father   . Colon cancer Brother    Scheduled Meds: . dextromethorphan-guaiFENesin  1 tablet Oral BID  . loratadine  10 mg Oral Daily  . oxybutynin  5 mg Oral BID  . pantoprazole  40 mg Oral BID   Continuous Infusions: PRN Meds:.acetaminophen, alum & mag hydroxide-simeth, LORazepam, morphine injection, nitroGLYCERIN, ondansetron (ZOFRAN) IV, promethazine Allergies  Allergen Reactions  . Enalapril Maleate Swelling    Throat swelling  . Sulfa Antibiotics Other (See Comments)    Does not remember.   Michail Sermon [Enalaprilat] Swelling    Tongue.    Review of Systems patient lethargic  Physical Exam  Well developed elderly female, awake, but very lethargic CV rrr resp on 2L with no distress Abdomen soft, NT to light palpation, +BS  Vital Signs: BP (!) 101/52   Pulse 73   Temp 98.3 F (36.8 C) (Oral)   Resp 18   Ht 5' 3"  (1.6 m)   Wt 83.7 kg   SpO2 100%   BMI 32.69 kg/m  Pain Scale: 0-10   Pain Score: 0-No pain   SpO2: SpO2: 100 % O2 Device:SpO2: 100 % O2 Flow Rate: .O2 Flow Rate (L/min): 4 L/min  IO: Intake/output summary: No intake or output data in the 24 hours ending 02/21/18 0758  LBM: Last BM Date: 02/17/18 Baseline Weight: Weight: 80.2 kg Most recent weight: Weight: 83.7 kg     Palliative Assessment/Data: 10-20%     Time In: 9:15 Time Out: 10:25 Time Total: 70 min. Greater than 50%  of  this time was spent counseling and coordinating care related to the above assessment and plan.  Signed by: Florentina Jenny, PA-C Palliative Medicine Pager: [phone]  Please contact Palliative Medicine Team phone at [phone] for questions and concerns.  For individual provider: See Shea Evans

## 2018-02-21 NOTE — Care Management Important Message (Signed)
Important Message  Patient Details  Name: Summer Wong MRN: [id number] Date of Birth: [birth year]   Medicare Important Message Given:  Yes    Shelda Altes 02/21/2018, 9:41 AM

## 2018-02-21 NOTE — Progress Notes (Signed)
Upon administering suppository, patient had a blockage of stool. Disimpacted approximately 2 small brown balls of stool, then administered suppository. Will continue to monitor.

## 2018-02-21 NOTE — Care Management (Signed)
Patient Information   Patient Name Summer Wong, Summer Wong ([id number]) Sex Female DOB 05/04/1925  Room Bed  A320 A320-01  Patient Demographics   Address [address] [place] [address] Phone [phone] (Home) *Preferred*  Patient Ethnicity & Race   Ethnic Group Patient Race  Not Hispanic or Latino White or Caucasian  Emergency Contact(s)   Name Relation Home Work Mobile  [place] Son [phone]  [phone]  Bruna Potter Relative [phone]  [phone]  Documents on File    Status Date Received Description  Documents for the Patient  [place] Not Received    [place] E-Signature HIPAA Notice of Privacy Received 21/22/48   Driver's License Received 25/00/37   Insurance Card Received 10/15/15 [facility] MEDICARE   Advance Directives/Living Will/HCPOA/POA Received 08/09/14   Other Photo ID Not Received    HIM ROI Authorization (Expired) 09/04/14   HIM ROI Authorization (Expired) 09/07/14 ED visit 08/30/14. D/C Summary and H&P for continuity of care.   Release of Information  09/11/14   Release of Information  09/12/14   AMB Correspondence  09/19/14 01/16-02/16 OFFICE NOTE [facility] [place]  AMB Outside Hospital Record  05/09/14 D/S [place]  AMB Correspondence  05/09/14 PATIENT HEALTH SUMMARY [facility]  AMB Correspondence  10/19/14 PACEMAKER ST [place]  AMB New Patient Records/Historical  09/07/14 REFERRAL [place]  Insurance Card Received 11/02/16 [facility] MEDICARE 2018  HIM ROI Authorization  09/08/17 [place]  Insurance Card Received 02/08/18   Insurance Card Received 02/08/18 NEW [facility] CARD-[facility]MA[facility]  HIM Release of Information Output (Deleted) 09/08/17 Requested records  Documents for the Encounter  AOB (Assignment of Insurance Benefits) Not Received    E-signature AOB Signed 01/29/2018   MEDICARE RIGHTS Not Received    E-signature Medicare Rights Signed  02/18/2018   Cardiac Monitoring Strip Shift Summary Received 01/28/2018   ED Patient Billing Extract   ED PB Billing Extract  Cardiac Monitoring Strip Received 02/15/18   Medicare Observation  02/15/18   HIM Release of Information Output   [id number]  HIM Release of Information Output   [id number]  EKG Received 02/15/18   Admission Information   Attending Provider Admitting Provider Admission Type Admission Date/Time  Rodena Goldmann, DO Shela Leff, MD Emergency 02/11/2018 1657  Discharge Date Hospital Service Auth/Cert Status [place]  Unit Room/Bed Admission Status   AP-DEPT 300 A320/A320-01 Admission (Confirmed)   Admission   Complaint  chest pains, SOB  Hospital Account   Name Acct ID Class Status Primary Coverage  [name], [name] [id number] Inpatient Open [place] HMO      Guarantor Account (for Hospital Account [id number])   Name Relation to [place]? Acct Type  [name] Self CHSA Yes Personal/Family  Address Phone    [address] [place], [place] [address] [phone])        Coverage Information (for Hospital Account [id number])   F/O Payor/Plan Precert #  [facility] [place] [id number]  [place] #  [name], [name] [id number]  Address Phone  [address] [place] [address] [phone]

## 2018-02-21 NOTE — Progress Notes (Signed)
Family requests that patient doesn't get the Phenergan or Zofran because it makes her too drowsy. MD made aware, stated it was ok to give the Reglan early. Will continue to monitor.

## 2018-02-21 NOTE — Progress Notes (Signed)
Present for emotional and spiritual support. Summer Wong appeared to be sleeping. Her DIL, [name] was with her and shared she'd asked her husband to go home to rest. We talked about how they were processing through this difficult diagnosis. We discussed the shock they were experiencing. They live in close proximity and appear to be a very connected family. We discuss as the days enfold what they may need, of the importance of being with her as difficult as that may be. Her other children will be coming in. Sherri also shares they are being open with this diagnosis. Will continue to offer support during this hospitalization. We prayed before I left.

## 2018-02-22 DIAGNOSIS — Z515 Encounter for palliative care: Secondary | ICD-10-CM

## 2018-02-22 MED ORDER — SIMETHICONE 40 MG/0.6ML PO SUSP
80.0000 mg | Freq: Four times a day (QID) | ORAL | Status: DC
  Administered 2018-02-22 (×2): 80 mg via ORAL
  Filled 2018-02-22: qty 30

## 2018-02-22 MED ORDER — FENTANYL CITRATE (PF) 100 MCG/2ML IJ SOLN: 25.0000 ug | INTRAMUSCULAR | Status: DC | PRN

## 2018-02-22 MED ORDER — DIPHENHYDRAMINE-ZINC ACETATE 2-0.1 % EX CREA
TOPICAL_CREAM | Freq: Every day | CUTANEOUS | Status: DC | PRN
  Filled 2018-02-22: qty 28

## 2018-02-22 MED ORDER — PROMETHAZINE HCL 25 MG/ML IJ SOLN
12.5000 mg | INTRAMUSCULAR | Status: DC | PRN
  Administered 2018-02-22: 12.5 mg via INTRAVENOUS
  Filled 2018-02-22: qty 1

## 2018-02-22 MED ORDER — ONDANSETRON HCL 4 MG/2ML IJ SOLN: 4.0000 mg | Freq: Four times a day (QID) | INTRAMUSCULAR | Status: DC | PRN

## 2018-02-22 MED ORDER — FENTANYL CITRATE (PF) 100 MCG/2ML IJ SOLN
25.0000 ug | INTRAMUSCULAR | Status: DC | PRN
  Administered 2018-02-23 (×2): 25 ug via INTRAVENOUS
  Filled 2018-02-22 (×2): qty 2

## 2018-02-22 NOTE — Care Management Note (Signed)
Case Management Note  Patient Details  Name: Summer Wong MRN: [id number] Date of Birth: 1926/03/04  If discussed at [place] Length of Stay Meetings, dates discussed:  02/22/09  Additional Comments:  Pt now comfort care. [facility] rep aware.   Sherald Barge, RN 02/22/2018, 1:58 PM

## 2018-02-22 NOTE — Progress Notes (Signed)
Diet order for patient canceled as patient isn't eating anything per family report. Comfort measures when it comes to patient's preferences on food and drink. Will continue to monitor.

## 2018-02-22 NOTE — Progress Notes (Signed)
PROGRESS NOTE    Summer Wong  OXB:[id number] DOB: 1926-02-01 DOA: 02/22/2018 PCP: [facility]   Brief Narrative:   Patient is a 84 year old female with past medical history of atrial fibrillation, chronic diastolic CHF, diabetes mellitus type 2 who presented to the emergency department with complaints of chest pain, shortness of breath, wheezing. She was just discharged from here on October 18 after management for the same. she improved with bronchodilators in the emergency department. She is currently managed for reactive airway disease with possible COPD. She had worsening anemia for which she received PRBC transfusion and then was noted to be volume overloaded for which she required diuresis.  Unfortunately, on the morning of 10/27 she began retching and having uncontrollable nausea and vomiting for which CT of the abdomen and pelvis was ordered demonstrating the presence of multiple large abdominal masses suggestive of metastatic disease.  Family members understand that she is near end of life and has been transitioned to comfort measures with further recommendations given by palliative care.  Assessment & Plan:  Principal Problem: Reactive airway disease with acute exacerbation Active Problems: A-fib (HCC) OSA on CPAP Essential hypertension Chronic anemia CKD (chronic kidney disease) stage 4, GFR 15-29 ml/min (HCC) Hyponatremia Type 2 diabetes mellitus (HCC) Chronic diastolic CHF (congestive heart failure) (HCC)  Acute hypoxemic respiratory failure-persistent: Appears to be multi-factorial and related to initial reactive airways disease, but is now complicated with pulmonary edemafor which diuresis was initiated. Unfortunately, there appear to be significant metastatic lesions in the abdominal cavity that are significant in size which may be also causing mechanical restriction in her breathing.   Continue cover measures with  recommendations of oral morphine and Ativan per palliative care appreciated.  Multiple intra-abdominal masses suspicious of metastatic lesions:This is in the setting of prior known endometrial cancer and hysterectomy in 2016. Discussed case with Dr. Delton Coombes who feels that comfort measures would be appropriate with no further treatment or evaluation at this time. Ordered CEA, CA-19-9, and Ca125 for documentation purposes.   Continue comfort measures with Reglan, Zofran, and Zyprexa for nausea and vomiting related discomfort.  Presyncope in the setting of worsening anemia-now stabilizing:This has improved after PRBC transfusion and appears tobe downtrending slightly, however now on comfort measures. No overt bleeding identified.   Discontinue SCDs.  Chest pain:Resolved. Monitoring repeat troponins as this has recurred this a.m. EKG with no acute findings. Chest x-ray with findings of ongoing pulmonary vascular congestion.No further pain noted at this time.  Acute onchronic diastolic CHF: Echocardiogram done yesterday ejection fraction of 63%, indeterminate diastolic function. No further diuresis or monitoring of strict I's and O's while on comfort measures.  A. fib: Stable. Discontinue Coreg and Eliquis..S.P pacemaker  Diabetes type 2: Last hemoglobin A1c of 6.Discontinue Lantus and SSI while on comfort measures.  CKD stage AS:TMHDQQI stable.Baseline creatinine around 2, discontinue monitoring and further diuresis.  OSA: Continueonly on nasal cannula CPAP is not tolerated.  Hypertension:Discontinue further hypertensive medications.    DVT prophylaxis: Eliquisnow to SCDs Code Status:DNR; comfort measures Family Communication:Spoke with Son at bedside and family Disposition Plan:Continue comfort measures as outlined by palliative care with anticipated in-hospital death.   Consultants:Palliative care    Procedures:None  Antimicrobials:None  Subjective: Patient seen and evaluated today with no new acute complaints or concerns. No acute concerns or events noted overnight.  She did have some retching as well as nausea and vomiting last night, but was more alert off the Phenergan and was able to talk  with family members.  Family members have requested Phenergan be restarted again and this has been done by palliative care.  Objective: Vitals:   02/20/18 2146 02/21/18 0531 02/21/18 1433 02/21/18 2202  BP: (!) 108/56 (!) 101/52 (!) 101/50 (!) 97/49  Pulse: 70 73 98 96  Resp: (!) 23 18 16    Temp: 98.4 F (36.9 C) 98.3 F (36.8 C) 97.8 F (36.6 C) 98.9 F (37.2 C)  TempSrc: Oral Oral Oral Oral  SpO2: 100% 100% 100% 98%  Weight:      Height:       No intake or output data in the 24 hours ending 02/22/18 1151 Filed Weights   02/15/18 0545 02/19/18 0611 02/20/18 0700  Weight: 83.3 kg 82.5 kg 83.7 kg    Examination:  General exam: Sedated Respiratory system: Clear to auscultation. Respiratory effort normal.  On nasal cannula Cardiovascular system: S1 & S2 heard, RRR. No JVD, murmurs, rubs, gallops or clicks. No pedal edema. Gastrointestinal system: Abdomen is nondistended, soft and nontender. No organomegaly or masses felt. Normal bowel sounds heard. Central nervous system: Sedated Extremities: Symmetric 5 x 5 power. Skin: No rashes, lesions or ulcers Psychiatry: Cannot be assessed    Data Reviewed: I have personally reviewed following labs and imaging studies  CBC: Recent Labs  Lab 02/16/18 0525 02/17/18 0507 02/18/18 0438 02/18/18 1556 02/19/18 0619 02/20/18 0430  WBC 4.6 7.6 10.3  --  9.1 9.4  HGB 8.8* 8.4* 7.2* 8.0* 8.3* 7.8*  HCT 29.5* 27.2* 23.0* 25.5* 26.5* 24.2*  MCV 86.8 82.7 84.6  --  83.1 84.0  PLT 226 240 223  --  215 505   Basic Metabolic Panel: Recent Labs  Lab 02/17/18 0507 02/18/18 0438 02/19/18 0619 02/20/18 0430 02/20/18 0431  NA 131*  131* 131*  --  129*  K 4.9 5.0 4.5  --  4.7  CL 100 100 95*  --  95*  CO2 21* 20* 23  --  22  GLUCOSE 183* 139* 154*  --  326*  BUN 59* 58* 68*  --  69*  CREATININE 1.75* 1.83* 2.07*  --  2.12*  CALCIUM 8.9 8.7* 8.9  --  8.6*  MG  --   --   --  2.3  --    GFR: Estimated Creatinine Clearance: 18.1 mL/min (A) (by C-G formula based on SCr of 2.12 mg/dL (H)). Liver Function Tests: Recent Labs  Lab 02/20/18 0431  AST 21  ALT 15  ALKPHOS 114  BILITOT 1.0  PROT 6.0*  ALBUMIN 3.2*   No results for input(s): LIPASE, AMYLASE in the last 168 hours. No results for input(s): AMMONIA in the last 168 hours. Coagulation Profile: No results for input(s): INR, PROTIME in the last 168 hours. Cardiac Enzymes: Recent Labs  Lab 02/19/18 1002 02/19/18 1646 02/19/18 2126 02/20/18 0430 02/20/18 1107  TROPONINI <0.03 0.03* 0.03* 0.04* 0.04*   BNP (last 3 results) No results for input(s): PROBNP in the last 8760 hours. HbA1C: No results for input(s): HGBA1C in the last 72 hours. CBG: Recent Labs  Lab 02/20/18 0728 02/20/18 1127 02/20/18 1619 02/20/18 2151 02/21/18 0741  GLUCAP 248* 245* 182* 241* 179*   Lipid Profile: No results for input(s): CHOL, HDL, LDLCALC, TRIG, CHOLHDL, LDLDIRECT in the last 72 hours. Thyroid Function Tests: No results for input(s): TSH, T4TOTAL, FREET4, T3FREE, THYROIDAB in the last 72 hours. Anemia Panel: No results for input(s): VITAMINB12, FOLATE, FERRITIN, TIBC, IRON, RETICCTPCT in the last 72 hours. Sepsis Labs: No results  for input(s): PROCALCITON, LATICACIDVEN in the last 168 hours.  No results found for this or any previous visit (from the past 240 hour(s)).       Radiology Studies: No results found.      Scheduled Meds: . OLANZapine zydis  2.5 mg Oral QHS  . simethicone  80 mg Oral QID   Continuous Infusions:   LOS: 4 days    Time spent: 30 minutes    Summer Weisenburger Darleen Crocker, DO [facility] Pager [phone]  If 7PM-7AM,  please contact night-coverage [email or web address] Password [email or web address] 02/22/2018, 11:51 AM

## 2018-02-22 NOTE — Progress Notes (Signed)
Daily Progress Note   Patient Name: Summer Wong       Date: 02/22/2018 DOB: 01-Jan-1926  Age: 84 y.o. MRN#: [id number] Attending Physician: Rodena Goldmann, DO Primary Care Physician: [facility] Date: 02/12/2018  Reason for Consultation/Follow-up: Establishing goals of care, Non pain symptom management, Pain control and Psychosocial/spiritual support  Subjective: Two sons and two DILs are at bedside.  Patient is awake but eyes closed and too weak to interact much. When I ask her directly she indicates that she is comfortable.    I reviewed the events of this hospitalization with her sons.  We reviewed the CT scan together.  We discussed her symptoms.  She was dry heaving several times overnight and she requested pain medication.  She now has quite a bit of itching (as a result of the morphine).  Her children were becoming very involved (stressed over) in which medication to use for what symptom.  I attempted to lift that burden from them by asking that they just let us know when she is uncomfortable and we will bring medication to relieve her symptoms - they do not have to make decisions about which medication to use.  We discussed hospice house  - the family seems to feel that they would rather her be here.  We discussed a time frame of days.   Assessment: Patient with advanced metastatic cancer.  No longer urinating, eating, or drinking more than sips when encouraged by family.  She is too weak to life her head.   Patient Profile/HPI:  84 y.o. female  with past medical history of endometrial cancer, DM2, CKD, MI, s/p pacemaker, afib, CHF, OSA, and COPD who was admitted on 02/21/2018 with difficulty breathing.  Work up revealed continued reactive airway  disease as well as masses of metastatic disease in her abdomen, pelvis, and spleen, that are so large they are causing significant pain, and difficulty breathing.  Dr. Manuella Ghazi shifted the patient to comfort care measures on 02/20/2018.  Length of Stay: 4  Current Medications: Scheduled Meds:  . OLANZapine zydis  2.5 mg Oral QHS    Continuous Infusions:   PRN Meds: acetaminophen, alum & mag hydroxide-simeth, fentaNYL (SUBLIMAZE) injection, LORazepam, LORazepam, nitroGLYCERIN, ondansetron (ZOFRAN) IV, promethazine  Physical Exam        Elderly  female, eyes close but awake.  Too weak to interact CV difficult to hear but no frank m/r/g Resp no distress, on 4 liters, decreased breath sounds Abdomen soft, distended.   Extremities - upper extremities with multiple bruises, lower extremities with 1-2+ edema   Vital Signs: BP (!) 97/49   Pulse 96   Temp 98.9 F (37.2 C) (Oral)   Resp 16   Ht 5\' 3"  (1.6 m)   Wt 83.7 kg   SpO2 98%   BMI 32.69 kg/m  SpO2: SpO2: 98 % O2 Device: O2 Device: Nasal Cannula O2 Flow Rate: O2 Flow Rate (L/min): 4 L/min  Intake/output summary: No intake or output data in the 24 hours ending 02/22/18 0844 LBM: Last BM Date: (pt unable to state) Baseline Weight: Weight: 80.2 kg Most recent weight: Weight: 83.7 kg       Palliative Assessment/Data: 10%    Flowsheet Rows     Most Recent Value  Intake Tab  Referral Department  Hospitalist  Unit at Time of Referral  Med/Surg Unit  Palliative Care Primary Diagnosis  Cancer  Date Notified  02/20/18  Palliative Care Type  New Palliative care  Reason for referral  Counsel Regarding Hospice  Date of Admission  02/24/2018  Date first seen by Palliative Care  02/21/18  # of days Palliative referral response time  1 Day(s)  # of days IP prior to Palliative referral  6  Clinical Assessment  Psychosocial & Spiritual Assessment  Palliative Care Outcomes      Patient Active Problem List   Diagnosis Date Noted    . Metastatic cancer ([place])   . Palliative care encounter   . COPD exacerbation ([place]) 02/18/2018  . Chronic anemia 02/15/2018  . CKD (chronic kidney disease) stage 4, GFR 15-29 ml/min (HCC) 02/15/2018  . Hyponatremia 02/15/2018  . Type 2 diabetes mellitus ([place]) 02/15/2018  . Chronic diastolic CHF (congestive heart failure) ([place]) 02/15/2018  . Reactive airway disease with acute exacerbation 02/15/2018  . Reactive airway disease   . CHF NYHA class I, acute on chronic, diastolic ([place]) 24/12/7351  . Coronary artery disease   . Acute on chronic diastolic CHF (congestive heart failure) ([place]) 11/02/2016  . Angina pectoris ([place]) 11/02/2016  . Uncontrolled type 2 diabetes mellitus with hyperglycemia ([place]) 11/02/2016  . Supratherapeutic INR   . CHF exacerbation ([place]) 02/27/2016  . Essential hypertension 02/27/2016  . Acute renal failure (ARF) ([place])   . Acute on chronic congestive heart failure ([place])   . Acute renal failure ([place])   . ARF (acute renal failure) ([place]) 10/16/2015  . CHF (congestive heart failure) ([place]) 10/15/2015  . Endometrial cancer ([place]) 08/16/2014  . FIGO stage II endometrial cancer ([place]) 07/11/2014  . Vulvovaginal candidiasis 07/11/2014  . Cutaneous candidiasis 07/11/2014  . Inactive tuberculosis of lung 03/08/2014  . Diabetes ([place]) 03/01/2014  . Arthritis, degenerative 03/01/2014  . Dyspnea 03/01/2014  . A-fib ([place]) 10/13/2012  . Arteriosclerosis of coronary artery 10/13/2012  . BP (high blood pressure) 10/13/2012  . OSA on CPAP 10/13/2012  . Sick sinus syndrome ([place]) 10/13/2012    Palliative Care Plan    Recommendations/Plan:  Full comfort care.  Now that family has visited they are more comfortable when her being sleepy rather than in pain.  Wean oxygen  Shift from morphine to fentanyl due to itching.  Shift from reglan back to phenergan as family states phenergan seemed to help more  Requested RN give Olanzepine (it dissolves under the tongue)  Goals of  Care and Additional Recommendations:  Limitations on Scope of Treatment: Full Comfort Care.  Do not escalate treatment   Code Status:  DNR  Prognosis:   Hours - Days   Discharge Planning:  Anticipated Hospital Death  Care plan was discussed with bedside RN and family.  Thank you for allowing the Palliative Medicine Team to assist in the care of this patient.  Total time spent:  45 min.     Greater than 50%  of this time was spent counseling and coordinating care related to the above assessment and plan.  Florentina Jenny, PA-C Palliative Medicine  Please contact Palliative MedicineTeam phone at [phone] for questions and concerns between 7 am - 7 pm.   Please see AMION for individual provider pager numbers.

## 2018-02-23 DIAGNOSIS — Z515 Encounter for palliative care: Secondary | ICD-10-CM

## 2018-02-23 MED ORDER — FENTANYL CITRATE (PF) 100 MCG/2ML IJ SOLN
25.0000 ug | Freq: Four times a day (QID) | INTRAMUSCULAR | Status: DC
  Administered 2018-02-23: 25 ug via INTRAVENOUS
  Filled 2018-02-23: qty 2

## 2018-02-23 MED ORDER — LORAZEPAM 2 MG/ML IJ SOLN: 0.5000 mg | Freq: Two times a day (BID) | INTRAMUSCULAR | Status: DC

## 2018-02-23 MED ORDER — LORAZEPAM 2 MG/ML IJ SOLN: 1.0000 mg | INTRAMUSCULAR | Status: DC | PRN

## 2018-02-23 MED ORDER — ATROPINE SULFATE 1 % OP SOLN: 2.0000 [drp] | Freq: Three times a day (TID) | OPHTHALMIC | Status: DC

## 2018-02-23 MED ORDER — GLYCOPYRROLATE 0.2 MG/ML IJ SOLN: 0.4000 mg | INTRAMUSCULAR | Status: DC | PRN

## 2018-02-23 MED ORDER — ATROPINE SULFATE 1 % OP SOLN
2.0000 [drp] | Freq: Three times a day (TID) | OPHTHALMIC | Status: DC
  Administered 2018-02-23: 2 [drp] via SUBLINGUAL
  Filled 2018-02-23: qty 2

## 2018-02-23 MED ORDER — FENTANYL CITRATE (PF) 100 MCG/2ML IJ SOLN
25.0000 ug | INTRAMUSCULAR | Status: DC | PRN
  Administered 2018-02-23: 25 ug via INTRAVENOUS
  Filled 2018-02-23: qty 2

## 2018-02-23 MED ORDER — OLANZAPINE 5 MG PO TABS: 2.5000 mg | ORAL_TABLET | Freq: Every day | ORAL | Status: DC

## 2018-02-25 NOTE — Progress Notes (Signed)
PROGRESS NOTE    Summer Wong  RFF:[id number] DOB: May 18, 1925 DOA: 02/06/2018 PCP: [facility]   Brief Narrative:   Patient is a 84 year old female with past medical history of atrial fibrillation, chronic diastolic CHF, diabetes mellitus type 2 who presented to the emergency department with complaints of chest pain, shortness of breath, wheezing. She was just discharged from here on October 18 after management for the same. she improved with bronchodilators in the emergency department. She is currently managed for reactive airway disease with possible COPD.She had worsening anemia for which she received PRBC transfusion and then was noted to be volume overloaded for which she required diuresis. Unfortunately, on the morning of 10/27 she began retching and having uncontrollable nausea and vomiting for which CT of the abdomen and pelvis was ordered demonstrating the presence of multiple large abdominal masses suggestive of metastatic disease. Family members understand that she is near end of life and has been transitioned to comfort measures with further recommendations given by palliative care.  Assessment & Plan:  Principal Problem: Reactive airway disease with acute exacerbation Active Problems: A-fib (HCC) OSA on CPAP Essential hypertension Chronic anemia CKD (chronic kidney disease) stage 4, GFR 15-29 ml/min (HCC) Hyponatremia Type 2 diabetes mellitus (HCC) Chronic diastolic CHF (congestive heart failure) (HCC)  Acute hypoxemic respiratory failure-persistent: Appears to be multi-factorial and related to initial reactive airways disease, but is now complicated with pulmonary edemafor which diuresis was initiated. Unfortunately, there appear to be significant metastatic lesions in the abdominal cavity that are significant in size which may be also causing mechanical restriction in her breathing.Continue cover measures with  recommendations of oral morphine and Ativan per palliative care appreciated. Fentanyl added as a scheduled dose on 10/30.  Multiple intra-abdominal masses suspicious of metastatic lesions:This is in the setting of prior known endometrial cancer and hysterectomy in 2016. Discussed case with Dr. Delton Coombes who feels that comfort measures would be appropriate with no further treatment or evaluation at this time. Ordered CEA, CA-19-9, and Ca125 for documentation purposes.CA125 over 2000 noted suggesting metastasis of prior known endometrial carcinoma.Continue comfort measures with Reglan, Zofran, and Zyprexa for nausea and vomiting related discomfort.  Presyncope in the setting of worsening anemia-now stabilizing:This has improved after PRBC transfusion and appears tobe downtrending slightly, however now on comfort measures. No overt bleeding identified.Discontinue SCDs.  Chest pain:Resolved. Monitoring repeat troponins as this has recurred this a.m. EKG with no acute findings. Chest x-ray with findings of ongoing pulmonary vascular congestion.No further pain noted at this time.  Acute onchronic diastolic CHF: Echocardiogram done yesterday ejection fraction of 63%, indeterminate diastolic function. No further diuresis or monitoring of strict I's and O's while on comfort measures.  A. fib: Stable. Discontinue Coreg and Eliquis..S.P pacemaker  Diabetes type 2: Last hemoglobin A1c of 6.Discontinue Lantus and SSI while on comfort measures.  CKD stage JT:TSVXBLT stable.Baseline creatinine around 2, discontinue monitoring and further diuresis.  OSA: Continueonly on nasal cannula CPAP is not tolerated.  Hypertension:Discontinue further hypertensive medications.    DVT prophylaxis: Eliquisnow to SCDs Code Status:DNR; comfort measures Family Communication:Spoke with Son at bedsideand family Disposition Plan:Continue comfort measures as outlined by palliative  care with anticipated in-hospital death soon.   Consultants:Palliative care   Procedures:None  Antimicrobials:None  Subjective: Patient seen and evaluated today with no new acute complaints or concerns. No acute concerns or events noted overnight. She is continuing to have progressive decline in her condition.  Objective: Vitals:   02/21/18 0531 02/21/18 1433 02/21/18  2202 02/22/18 1358  BP: (!) 101/52 (!) 101/50 (!) 97/49   Pulse: 73 98 96 96  Resp: 18 16    Temp: 98.3 F (36.8 C) 97.8 F (36.6 C) 98.9 F (37.2 C)   TempSrc: Oral Oral Oral   SpO2: 100% 100% 98% 97%  Weight:      Height:       No intake or output data in the 24 hours ending 03/05/18 1646 Filed Weights   02/15/18 0545 02/19/18 0611 02/20/18 0700  Weight: 83.3 kg 82.5 kg 83.7 kg    Examination:  General exam: Somnolent and comfortable. Respiratory system: Clear to auscultation. Respiratory effort normal. Cardiovascular system: S1 & S2 heard, RRR. No JVD, murmurs, rubs, gallops or clicks. No pedal edema. Gastrointestinal system: Abdomen is nondistended, soft and nontender. No organomegaly or masses felt. Normal bowel sounds heard. Central nervous system: Sedated/somnolent. Extremities: Symmetric 5 x 5 power. Skin: No rashes, lesions or ulcers Psychiatry: Cannot be evaluated.     Data Reviewed: I have personally reviewed following labs and imaging studies  CBC: Recent Labs  Lab 02/17/18 0507 02/18/18 0438 02/18/18 1556 02/19/18 0619 02/20/18 0430  WBC 7.6 10.3  --  9.1 9.4  HGB 8.4* 7.2* 8.0* 8.3* 7.8*  HCT 27.2* 23.0* 25.5* 26.5* 24.2*  MCV 82.7 84.6  --  83.1 84.0  PLT 240 223  --  215 858   Basic Metabolic Panel: Recent Labs  Lab 02/17/18 0507 02/18/18 0438 02/19/18 0619 02/20/18 0430 02/20/18 0431  NA 131* 131* 131*  --  129*  K 4.9 5.0 4.5  --  4.7  CL 100 100 95*  --  95*  CO2 21* 20* 23  --  22  GLUCOSE 183* 139* 154*  --  326*  BUN 59* 58* 68*  --  69*  CREATININE  1.75* 1.83* 2.07*  --  2.12*  CALCIUM 8.9 8.7* 8.9  --  8.6*  MG  --   --   --  2.3  --    GFR: Estimated Creatinine Clearance: 18.1 mL/min (A) (by C-G formula based on SCr of 2.12 mg/dL (H)). Liver Function Tests: Recent Labs  Lab 02/20/18 0431  AST 21  ALT 15  ALKPHOS 114  BILITOT 1.0  PROT 6.0*  ALBUMIN 3.2*   No results for input(s): LIPASE, AMYLASE in the last 168 hours. No results for input(s): AMMONIA in the last 168 hours. Coagulation Profile: No results for input(s): INR, PROTIME in the last 168 hours. Cardiac Enzymes: Recent Labs  Lab 02/19/18 1002 02/19/18 1646 02/19/18 2126 02/20/18 0430 02/20/18 1107  TROPONINI <0.03 0.03* 0.03* 0.04* 0.04*   BNP (last 3 results) No results for input(s): PROBNP in the last 8760 hours. HbA1C: No results for input(s): HGBA1C in the last 72 hours. CBG: Recent Labs  Lab 02/20/18 0728 02/20/18 1127 02/20/18 1619 02/20/18 2151 02/21/18 0741  GLUCAP 248* 245* 182* 241* 179*   Lipid Profile: No results for input(s): CHOL, HDL, LDLCALC, TRIG, CHOLHDL, LDLDIRECT in the last 72 hours. Thyroid Function Tests: No results for input(s): TSH, T4TOTAL, FREET4, T3FREE, THYROIDAB in the last 72 hours. Anemia Panel: No results for input(s): VITAMINB12, FOLATE, FERRITIN, TIBC, IRON, RETICCTPCT in the last 72 hours. Sepsis Labs: No results for input(s): PROCALCITON, LATICACIDVEN in the last 168 hours.  No results found for this or any previous visit (from the past 240 hour(s)).       Radiology Studies: No results found.      Scheduled Meds: .  atropine  2 drop Sublingual TID  . fentaNYL (SUBLIMAZE) injection  25 mcg Intravenous Q6H  . LORazepam  0.5 mg Intravenous Q12H   Continuous Infusions:   LOS: 5 days    Time spent: 30 minutes    Aceson Labell Darleen Crocker, DO [facility] Pager [phone]  If 7PM-7AM, please contact night-coverage [email or web address] Password [email or web address] 2018-03-08, 4:46 PM

## 2018-02-25 NOTE — Progress Notes (Signed)
Notified by family that patient had expired. Assessed patient with nurse Santa Lighter. No respirations, no apical HR noted, pupils fixed and dilated. TOD 1930. Patient taken to morgue at 2040. Family requested [employer] from [place].

## 2018-02-25 NOTE — Progress Notes (Signed)
Daily Progress Note   Patient Name: Summer Wong       Date: 03/02/18 DOB: 09-12-1925  Age: 84 y.o. MRN#: [id number] Attending Physician: Rodena Goldmann, DO Primary Care Physician: [facility] Date: 02/13/2018  Reason for Consultation/Follow-up: Non pain symptom management, Pain control, Psychosocial/spiritual support and Terminal Care  Subjective: Family is at bedside. Patient appears as though she is progressing more quickly thru the dying process now.  Son is concerned that she is suffering.  "I don't want this to go on longer than it has too".  Answered family members questions about adverse reactions to pain medications.  Talked with family about the signs of pain / discomfort in the dying process when she is less able to communicate.  Assessment: Patient is less responsive.  Her color is more pale.  Lungs now with crackles.  No urine in 48 hours +.  She is actively dying.   Patient Profile/HPI:   84 y.o.femalewith past medical history of endometrial cancer, DM2, CKD, MI, s/p pacemaker, afib, CHF, OSA, and COPDwho was admitted on 10/21/2019with difficulty breathing.Work up revealed continued reactive airway disease as well as masses of metastatic disease in her abdomen, pelvis, and spleen, that are so large they are causing significant pain, and difficulty breathing. Dr. Manuella Ghazi shifted the patient to comfort care measures on 02/20/2018.   Length of Stay: 5  Current Medications: Scheduled Meds:  . OLANZapine  2.5 mg Oral QHS  . simethicone  80 mg Oral QID    Continuous Infusions:   PRN Meds: acetaminophen, alum & mag hydroxide-simeth, diphenhydrAMINE-zinc acetate, fentaNYL (SUBLIMAZE) injection, LORazepam, LORazepam, nitroGLYCERIN,  ondansetron (ZOFRAN) IV, promethazine  Physical Exam        Well developed elderly female, lethargic, pale, lying in bed with a grimace on her face, no acute distress. She has intermittent tremor of her lips, and occasionally raises her right arm (attempting to scratch?) CV irreg irreg Resp crackles heard bilaterally, no distress.  No longer on supplemental oxygen Abdomen soft    Vital Signs: BP (!) 97/49   Pulse 96   Temp 98.9 F (37.2 C) (Oral)   Resp 16   Ht 5\' 3"  (1.6 m)   Wt 83.7 kg   SpO2 97%   BMI 32.69 kg/m  SpO2: SpO2: 97 % O2  Device: O2 Device: Nasal Cannula O2 Flow Rate: O2 Flow Rate (L/min): 2 L/min  Intake/output summary: No intake or output data in the 24 hours ending March 10, 2018 1039 LBM: Last BM Date: 02/21/18 Baseline Weight: Weight: 80.2 kg Most recent weight: Weight: 83.7 kg       Palliative Assessment/Data: 10%    Flowsheet Rows     Most Recent Value  Intake Tab  Referral Department  Hospitalist  Unit at Time of Referral  Med/Surg Unit  Palliative Care Primary Diagnosis  Cancer  Date Notified  02/20/18  Palliative Care Type  New Palliative care  Reason for referral  Counsel Regarding Hospice  Date of Admission  02/21/2018  Date first seen by Palliative Care  02/21/18  # of days Palliative referral response time  1 Day(s)  # of days IP prior to Palliative referral  6  Clinical Assessment  Psychosocial & Spiritual Assessment  Palliative Care Outcomes      Patient Active Problem List   Diagnosis Date Noted  . Comfort measures only status   . Metastatic cancer ([place])   . Palliative care encounter   . COPD exacerbation ([place]) 02/18/2018  . Chronic anemia 02/15/2018  . CKD (chronic kidney disease) stage 4, GFR 15-29 ml/min (HCC) 02/15/2018  . Hyponatremia 02/15/2018  . Type 2 diabetes mellitus ([place]) 02/15/2018  . Chronic diastolic CHF (congestive heart failure) ([place]) 02/15/2018  . Reactive airway disease with acute exacerbation 02/15/2018  .  Reactive airway disease   . CHF NYHA class I, acute on chronic, diastolic ([place]) 01/60/1093  . Coronary artery disease   . Acute on chronic diastolic CHF (congestive heart failure) ([place]) 11/02/2016  . Angina pectoris ([place]) 11/02/2016  . Uncontrolled type 2 diabetes mellitus with hyperglycemia ([place]) 11/02/2016  . Supratherapeutic INR   . CHF exacerbation ([place]) 02/27/2016  . Essential hypertension 02/27/2016  . Acute renal failure (ARF) ([place])   . Acute on chronic congestive heart failure ([place])   . Acute renal failure ([place])   . ARF (acute renal failure) ([place]) 10/16/2015  . CHF (congestive heart failure) ([place]) 10/15/2015  . Endometrial cancer ([place]) 08/16/2014  . FIGO stage II endometrial cancer ([place]) 07/11/2014  . Vulvovaginal candidiasis 07/11/2014  . Cutaneous candidiasis 07/11/2014  . Inactive tuberculosis of lung 03/08/2014  . Diabetes ([place]) 03/01/2014  . Arthritis, degenerative 03/01/2014  . Dyspnea 03/01/2014  . A-fib ([place]) 10/13/2012  . Arteriosclerosis of coronary artery 10/13/2012  . BP (high blood pressure) 10/13/2012  . OSA on CPAP 10/13/2012  . Sick sinus syndrome ([place]) 10/13/2012    Palliative Care Plan    Recommendations/Plan:  Will schedule fentanyl 25 mcg q 6 hours and continue to have PRN available as well.  Will schedule low dose ativan in addition to PRN   Goals of Care and Additional Recommendations:  Limitations on Scope of Treatment: Full Comfort Care  Code Status:  DNR  Prognosis:   Hours - Days   Discharge Planning:  Anticipated Hospital Death  Care plan was discussed with family and Dr. Manuella Ghazi  Thank you for allowing the Palliative Medicine Team to assist in the care of this patient.  Total time spent:  35 min.     Greater than 50%  of this time was spent counseling and coordinating care related to the above assessment and plan.  Florentina Jenny, PA-C Palliative Medicine  Please contact Palliative MedicineTeam phone at [phone]  for questions and concerns between 7 am - 7 pm.   Please see AMION for  individual provider pager numbers.

## 2018-02-25 NOTE — Discharge Summary (Addendum)
Physician Discharge Summary  Summer Wong ZDG:[id number] DOB: May 16, 1925 DOA: 02/08/2018  PCP: [facility] date: 02/21/2018  Discharge date: 03/24/18 1930  Admitted From:Home  Disposition:  Expired  Brief/Interim Summary: Patient is a 84 year old female with past medical history of atrial fibrillation, chronic diastolic CHF, diabetes mellitus type 2 who presented to the emergency department with complaints of chest pain, shortness of breath, wheezing. She was just discharged from here on October 18 after management for the same. She improved with bronchodilators in the emergency department. She was initially managed for reactive airway disease with possible COPD.She had worsening anemia for which she received PRBC transfusion and then was noted to be volume overloaded for which she required diuresis. Unfortunately, on the morning of 10/27 she began retching and having uncontrollable nausea and vomiting for which CT of the abdomen and pelvis was ordered demonstrating the presence of multiple large abdominal masses suggestive of metastatic disease. She was noted to have a very poor prognosis and blood levels of Ca 125 or greater than 2000, suggesting metastatic endometrial cancer.  Discussion was had with oncologist Dr. Delton Coombes who stated that patient was not a candidate for any further chemoradiation and patient was placed on comfort measures and was seen by palliative care.  There were no further acute events until the time of her expiration on 03-24-18 at [place].  Discharge Diagnoses:  Principal Problem:   Reactive airway disease with acute exacerbation Active Problems:   A-fib (HCC)   OSA on CPAP   Essential hypertension   Chronic anemia   CKD (chronic kidney disease) stage 4, GFR 15-29 ml/min (HCC)   Hyponatremia   Type 2 diabetes mellitus (HCC)   Chronic diastolic CHF (congestive heart failure) (HCC)   COPD exacerbation (HCC)   Metastatic  cancer (HCC)   Palliative care encounter   Comfort measures only status   Terminal care  Principal diagnosis: Acute hypoxemic respiratory failure with metastatic endometrial cancer.   Allergies  Allergen Reactions  . Enalapril Maleate Swelling    Throat swelling  . Sulfa Antibiotics Other (See Comments)    Does not remember.   Michail Sermon [Enalaprilat] Swelling    Tongue.     Consultations:  Palliative care   Procedures/Studies: Ct Abdomen Pelvis Wo Contrast  Result Date: 02/20/2018 CLINICAL DATA:  84 year old female. History of high-grade endometrial carcinoma diagnosis in 2016 status post prior hysterectomy presenting with abdominal pain, nausea vomiting. EXAM: CT ABDOMEN AND PELVIS WITHOUT CONTRAST TECHNIQUE: Multidetector CT imaging of the abdomen and pelvis was performed following the standard protocol without IV contrast. COMPARISON:  CT of the abdomen pelvis dated 12/14/2014 and 07/23/2014 FINDINGS: Evaluation of this exam is limited in the absence of intravenous contrast. Lower chest: Trace right pleural effusion and subsegmental right lung base atelectasis. There is multi vessel coronary vascular calcification. An AICD device is partially visualized. There is hypoattenuation of the cardiac blood pool suggestive of a degree of anemia. Clinical correlation is recommended. No intra-abdominal free air. Small ascites. Hepatobiliary: Relatively stable 15 mm hypodense lesion in the left lobe of the liver, incompletely characterized on this noncontrast CT, likely cysts. Cholecystectomy. No retained calcified stone noted in the central CBD. Pancreas: Unremarkable. No pancreatic ductal dilatation or surrounding inflammatory changes. Spleen: Multiple splenic hypodense lesions with the largest lesion measuring approximately 4.7 x 3.7 cm, and new since the prior CT concerning for metastatic disease. Splenic abscesses are less likely but not excluded. Clinical correlation is recommended.  Adrenals/Urinary Tract:  The adrenal glands are unremarkable. Stable 15 mm left renal upper pole hypodense lesion, likely a cyst. There is minimal fullness of the right renal collecting system likely secondary to mass effect and compression of the distal right ureter. No stone. There is no hydronephrosis or nephrolithiasis on the left. The urinary bladder is unremarkable. Stomach/Bowel: There is a small hiatal hernia. Moderate amount of stool noted throughout the colon. There is no bowel obstruction or active inflammation. There is sigmoid diverticulosis without active inflammatory changes. Normal appendix. Vascular/Lymphatic: Advanced aortoiliac atherosclerotic disease. No portal venous gas. No retroperitoneal adenopathy. Reproductive: Hysterectomy. Other: There are multiple soft tissue masses within the abdomen and pelvis, one measuring approximately 10 x 8 x 10 cm in the lower abdomen/pelvis and an additional mass more superiorly measures approximately 7 x 8 x 14 cm. There is a 4.5 x 6.5 x 4.5 cm mass in the posterior pelvis. There is nodularity of the omentum in the left upper abdomen. Findings most consistent with malignancy such as sarcoma or recurrent/metastatic disease related to known endometrial cancer. Musculoskeletal: Osteopenia with degenerative changes of the spine and scoliosis. No acute osseous pathology. IMPRESSION: 1. Multiple soft tissue masses within the abdomen and pelvis, new since the prior CT consistent with malignancy or recurrence/metastatic disease. 2. Interval development of multiple splenic hypodense lesions concerning for metastatic disease. 3. No bowel obstruction or active inflammation. Normal appendix. 4. Small right pleural effusion and small ascites. Electronically Signed   By: Anner Crete M.D.   On: 02/20/2018 05:38   Dg Chest 2 View  Result Date: 02/15/2018 CLINICAL DATA:  Shortness of breath and chest pain. Recent hospitalization for CHF. EXAM: CHEST - 2 VIEW  COMPARISON:  Chest radiograph February 10, 2018 FINDINGS: Cardiac silhouette is normal size. Calcified aortic arch. Similar interstitial prominence without pleural effusion or pleural focal consolidation. Punctate LEFT lung granuloma. Dual lead LEFT cardiac pacemaker in situ. No pneumothorax. Soft tissue planes and included osseous structures are non suspicious. Surgical clips in the included abdomen compatible with cholecystectomy. IMPRESSION: Mild chronic interstitial changes. Aortic Atherosclerosis (ICD10-I70.0). Electronically Signed   By: Elon Alas M.D.   On: 02/12/2018 18:33   Dg Chest 2 View  Result Date: 02/08/2018 CLINICAL DATA:  Shortness of breath for 1 month.  Evaluate for CHF EXAM: CHEST - 2 VIEW COMPARISON:  11/04/2016 FINDINGS: Heart is borderline in size. Left pacer remains in place, unchanged. Slight increased markings in the lung bases could reflect early interstitial edema. No effusions or acute bony abnormality. IMPRESSION: Borderline heart size with slight increased markings in the infrahilar regions which could reflect early interstitial edema. Electronically Signed   By: Rolm Baptise M.D.   On: 02/08/2018 11:52   Ct Head Wo Contrast  Result Date: 02/20/2018 CLINICAL DATA:  84 y/o F; nausea, vomiting, altered level of consciousness. EXAM: CT HEAD WITHOUT CONTRAST TECHNIQUE: Contiguous axial images were obtained from the base of the skull through the vertex without intravenous contrast. COMPARISON:  07/15/2017 CT head. FINDINGS: Brain: No evidence of acute infarction, hemorrhage, hydrocephalus, extra-axial collection or mass lesion/mass effect. Stable chronic microvascular ischemic changes and volume loss of the brain. Stable small chronic infarction within the right anterior basal ganglia. Vascular: Calcific atherosclerosis of the carotid siphons. No hyperdense vessel identified. Skull: Normal. Negative for fracture or focal lesion. Sinuses/Orbits: Right maxillary sinus mucous  retention cyst. Chronic inflammatory changes of the walls of right sphenoid sinus. Additional visible paranasal sinuses are normally aerated. Stable right mastoid tip opacification. Normal aeration  of left mastoid air cells. Bilateral intra-ocular lens replacement. Other: None. IMPRESSION: 1. No acute intracranial abnormality identified. 2. Stable chronic microvascular ischemic changes and volume loss of the brain. Stable small chronic infarct in the right anterior basal ganglia. Electronically Signed   By: Kristine Garbe M.D.   On: 02/20/2018 05:10   Dg Chest Port 1 View  Result Date: 02/20/2018 CLINICAL DATA:  84 year old female with nausea vomiting. EXAM: PORTABLE CHEST 1 VIEW COMPARISON:  Chest radiograph dated 02/19/2018 FINDINGS: There is no focal consolidation, pleural effusion, or pneumothorax. Borderline cardiomegaly. Left pectoral pacemaker device. Atherosclerotic calcification of the aortic arch. No acute osseous pathology. IMPRESSION: No active disease. Electronically Signed   By: Anner Crete M.D.   On: 02/20/2018 05:16   Dg Chest Port 1 View  Result Date: 02/19/2018 CLINICAL DATA:  Chest pain. EXAM: PORTABLE CHEST 1 VIEW COMPARISON:  February 17, 2018 FINDINGS: Stable mild cardiomegaly. Stable pacemaker. The hila and mediastinum are unremarkable. No pneumothorax. Mild opacity in the right lung base. Mild interstitial prominence. IMPRESSION: 1. Cardiomegaly and suspected mild pulmonary venous congestion. 2. Opacity in the right base may represent vascular crowding, atelectasis, or developing infiltrate. Recommend clinical correlation and attention on follow-up. Electronically Signed   By: Dorise Bullion III M.D   On: 02/19/2018 12:13   Dg Chest Port 1 View  Result Date: 02/17/2018 CLINICAL DATA:  Acute shortness of breath and near syncope. EXAM: PORTABLE CHEST 1 VIEW COMPARISON:  02/05/2018 and prior exams FINDINGS: Cardiomegaly and pulmonary vascular congestion noted. A  LEFT-sided pacemaker is present. There is no evidence of focal airspace disease, pulmonary edema, suspicious pulmonary nodule/mass, pleural effusion, or pneumothorax. No acute bony abnormalities are identified. IMPRESSION: Cardiomegaly with pulmonary vascular congestion. Electronically Signed   By: Margarette Canada M.D.   On: 02/17/2018 17:39   Dg Chest Portable 1 View  Result Date: 02/10/2018 CLINICAL DATA:  Increasing shortness of Breath EXAM: PORTABLE CHEST 1 VIEW COMPARISON:  02/08/2018 FINDINGS: Cardiac shadow is stable. Pacing device is again noted. Aortic calcifications are again seen. The lungs are well aerated bilaterally. No focal infiltrate or sizable effusion is seen. No bony abnormality is noted. IMPRESSION: No acute abnormality seen. Electronically Signed   By: Inez Catalina M.D.   On: 02/10/2018 10:59     Discharge Exam: Vitals:   02/21/18 2202 02/22/18 1358  BP: (!) 97/49   Pulse: 96 96  Resp:    Temp: 98.9 F (37.2 C)   SpO2: 98% 97%   Vitals:   02/21/18 1433 02/21/18 2202 02/22/18 1358 March 14, 2018 1949  BP: (!) 101/50 (!) 97/49    Pulse: 98 96 96   Resp: 16     Temp: 97.8 F (36.6 C) 98.9 F (37.2 C)    TempSrc: Oral Oral    SpO2: 100% 98% 97%   Weight:    83.7 kg  Height:    5\' 3"  (1.6 m)     The results of significant diagnostics from this hospitalization (including imaging, microbiology, ancillary and laboratory) are listed below for reference.     Microbiology: No results found for this or any previous visit (from the past 240 hour(s)).   Labs: BNP (last 3 results) Recent Labs    02/10/18 1047 02/12/2018 1737  BNP 307.0* 811.9*   Basic Metabolic Panel: Recent Labs  Lab 02/18/18 0438 02/19/18 0619 02/20/18 0430 02/20/18 0431  NA 131* 131*  --  129*  K 5.0 4.5  --  4.7  CL 100 95*  --  95*  CO2 20* 23  --  22  GLUCOSE 139* 154*  --  326*  BUN 58* 68*  --  69*  CREATININE 1.83* 2.07*  --  2.12*  CALCIUM 8.7* 8.9  --  8.6*  MG  --   --  2.3  --     Liver Function Tests: Recent Labs  Lab 02/20/18 0431  AST 21  ALT 15  ALKPHOS 114  BILITOT 1.0  PROT 6.0*  ALBUMIN 3.2*   No results for input(s): LIPASE, AMYLASE in the last 168 hours. No results for input(s): AMMONIA in the last 168 hours. CBC: Recent Labs  Lab 02/18/18 0438 02/18/18 1556 02/19/18 0619 02/20/18 0430  WBC 10.3  --  9.1 9.4  HGB 7.2* 8.0* 8.3* 7.8*  HCT 23.0* 25.5* 26.5* 24.2*  MCV 84.6  --  83.1 84.0  PLT 223  --  215 231   Cardiac Enzymes: Recent Labs  Lab 02/19/18 1002 02/19/18 1646 02/19/18 2126 02/20/18 0430 02/20/18 1107  TROPONINI <0.03 0.03* 0.03* 0.04* 0.04*   BNP: Invalid input(s): POCBNP CBG: Recent Labs  Lab 02/20/18 0728 02/20/18 1127 02/20/18 1619 02/20/18 2151 02/21/18 0741  GLUCAP 248* 245* 182* 241* 179*   D-Dimer No results for input(s): DDIMER in the last 72 hours. Hgb A1c No results for input(s): HGBA1C in the last 72 hours. Lipid Profile No results for input(s): CHOL, HDL, LDLCALC, TRIG, CHOLHDL, LDLDIRECT in the last 72 hours. Thyroid function studies No results for input(s): TSH, T4TOTAL, T3FREE, THYROIDAB in the last 72 hours.  Invalid input(s): FREET3 Anemia work up No results for input(s): VITAMINB12, FOLATE, FERRITIN, TIBC, IRON, RETICCTPCT in the last 72 hours. Urinalysis    Component Value Date/Time   COLORURINE YELLOW 02/10/2018 1046   APPEARANCEUR HAZY (A) 02/10/2018 1046   LABSPEC 1.006 02/10/2018 1046   PHURINE 5.0 02/10/2018 1046   GLUCOSEU NEGATIVE 02/10/2018 1046   HGBUR SMALL (A) 02/10/2018 1046   BILIRUBINUR NEGATIVE 02/10/2018 1046   KETONESUR NEGATIVE 02/10/2018 1046   PROTEINUR NEGATIVE 02/10/2018 1046   UROBILINOGEN 0.2 08/30/2014 1838   NITRITE NEGATIVE 02/10/2018 1046   LEUKOCYTESUR TRACE (A) 02/10/2018 1046   Sepsis Labs Invalid input(s): PROCALCITONIN,  WBC,  LACTICIDVEN Microbiology No results found for this or any previous visit (from the past 240 hour(s)).   Time  coordinating discharge: 25 minutes  SIGNED:   Rodena Goldmann, DO [facility] 02/24/2018, 6:48 AM Pager [phone]  If 7PM-7AM, please contact night-coverage [email or web address] Password [email or web address]

## 2018-02-25 DEATH — deceased
